# Patient Record
Sex: Female | Born: 1990
Health system: Southern US, Community
[De-identification: ages and names within clinical notes are randomized; demographics above are authoritative.]

## PROBLEM LIST (undated history)

## (undated) ENCOUNTER — Inpatient Hospital Stay (HOSPITAL_COMMUNITY): Payer: Medicaid Other

## (undated) ENCOUNTER — Inpatient Hospital Stay (HOSPITAL_COMMUNITY): Payer: Self-pay

## (undated) DIAGNOSIS — O139 Gestational [pregnancy-induced] hypertension without significant proteinuria, unspecified trimester: Secondary | ICD-10-CM

## (undated) DIAGNOSIS — R8271 Bacteriuria: Secondary | ICD-10-CM

## (undated) DIAGNOSIS — K219 Gastro-esophageal reflux disease without esophagitis: Secondary | ICD-10-CM

## (undated) DIAGNOSIS — O99019 Anemia complicating pregnancy, unspecified trimester: Secondary | ICD-10-CM

## (undated) DIAGNOSIS — R898 Other abnormal findings in specimens from other organs, systems and tissues: Secondary | ICD-10-CM

## (undated) DIAGNOSIS — F419 Anxiety disorder, unspecified: Secondary | ICD-10-CM

## (undated) DIAGNOSIS — I10 Essential (primary) hypertension: Secondary | ICD-10-CM

## (undated) DIAGNOSIS — F4310 Post-traumatic stress disorder, unspecified: Secondary | ICD-10-CM

## (undated) DIAGNOSIS — F329 Major depressive disorder, single episode, unspecified: Secondary | ICD-10-CM

## (undated) HISTORY — DX: Anxiety disorder, unspecified: F41.9

## (undated) HISTORY — DX: Essential (primary) hypertension: I10

## (undated) HISTORY — DX: Post-traumatic stress disorder, unspecified: F43.10

---
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
## 2011-05-22 DIAGNOSIS — O99019 Anemia complicating pregnancy, unspecified trimester: Secondary | ICD-10-CM

## 2011-05-22 DIAGNOSIS — F32A Depression, unspecified: Secondary | ICD-10-CM

## 2011-05-22 HISTORY — DX: Depression, unspecified: F32.A

## 2011-05-22 HISTORY — DX: Anemia complicating pregnancy, unspecified trimester: O99.019

## 2014-10-30 ENCOUNTER — Encounter (HOSPITAL_COMMUNITY): Payer: Self-pay | Admitting: Emergency Medicine

## 2014-10-30 ENCOUNTER — Emergency Department (HOSPITAL_COMMUNITY)
Admission: EM | Admit: 2014-10-30 | Discharge: 2014-10-30 | Disposition: A | Discharge status: Home / Self Care | Payer: Self-pay | Attending: Emergency Medicine | Admitting: Emergency Medicine

## 2014-10-30 DIAGNOSIS — R109 Unspecified abdominal pain: Secondary | ICD-10-CM | POA: Insufficient documentation

## 2014-10-30 DIAGNOSIS — Z720 Tobacco use: Secondary | ICD-10-CM | POA: Insufficient documentation

## 2014-10-30 DIAGNOSIS — N939 Abnormal uterine and vaginal bleeding, unspecified: Secondary | ICD-10-CM | POA: Insufficient documentation

## 2014-10-30 DIAGNOSIS — Z3202 Encounter for pregnancy test, result negative: Secondary | ICD-10-CM | POA: Insufficient documentation

## 2014-10-30 LAB — COMPREHENSIVE METABOLIC PANEL
ALT: 16 U/L (ref 14–54)
AST: 16 U/L (ref 15–41)
Albumin: 4 g/dL (ref 3.5–5.0)
Alkaline Phosphatase: 47 U/L (ref 38–126)
Anion gap: 6 (ref 5–15)
BUN: 10 mg/dL (ref 6–20)
CO2: 23 mmol/L (ref 22–32)
Calcium: 8.9 mg/dL (ref 8.9–10.3)
Chloride: 109 mmol/L (ref 101–111)
Creatinine, Ser: 0.84 mg/dL (ref 0.44–1.00)
GFR calc non Af Amer: >60 mL/min (ref >60)
Glucose, Bld: 86 mg/dL (ref 65–99)
Potassium: 3.7 mmol/L (ref 3.5–5.1)
Sodium: 138 mmol/L (ref 135–145)
TOTAL PROTEIN: 7 g/dL (ref 6.5–8.1)
Total Bilirubin: 0.5 mg/dL (ref 0.3–1.2)

## 2014-10-30 LAB — CBC WITH DIFFERENTIAL/PLATELET
BASOS ABS: 0 10*3/uL (ref 0.0–0.1)
Basophils Relative: 0 % (ref 0–1)
Eosinophils Absolute: 0.2 10*3/uL (ref 0.0–0.7)
Eosinophils Relative: 3 % (ref 0–5)
HCT: 43.2 % (ref 36.0–46.0)
Hemoglobin: 14.4 g/dL (ref 12.0–15.0)
Lymphocytes Relative: 29 % (ref 12–46)
Lymphs Abs: 2 10*3/uL (ref 0.7–4.0)
MCH: 28 pg (ref 26.0–34.0)
MCHC: 33.3 g/dL (ref 30.0–36.0)
MCV: 84 fL (ref 78.0–100.0)
Monocytes Absolute: 0.7 10*3/uL (ref 0.1–1.0)
Monocytes Relative: 10 % (ref 3–12)
NEUTROS PCT: 58 % (ref 43–77)
Neutro Abs: 4.1 10*3/uL (ref 1.7–7.7)
PLATELETS: 436 10*3/uL — AB (ref 150–400)
RBC: 5.14 MIL/uL — ABNORMAL HIGH (ref 3.87–5.11)
RDW: 13.3 % (ref 11.5–15.5)
WBC: 7.1 10*3/uL (ref 4.0–10.5)

## 2014-10-30 LAB — POC URINE PREG, ED: PREG TEST UR: NEGATIVE

## 2014-10-30 LAB — WET PREP, GENITAL
Clue Cells Wet Prep HPF POC: NONE SEEN
TRICH WET PREP: NONE SEEN
Yeast Wet Prep HPF POC: NONE SEEN

## 2014-10-30 LAB — ABO/RH: ABO/RH(D): A POS

## 2014-10-30 LAB — HCG, QUANTITATIVE, PREGNANCY: hCG, Beta Chain, Quant, S: 4 m[IU]/mL (ref <5)

## 2014-10-30 NOTE — ED Provider Notes (Signed)
CSN: [id number]     Arrival date & time 10/30/14  1616 History   First MD Initiated Contact with Patient 10/30/14 1631     Chief Complaint  Patient presents with  . Vaginal Bleeding  . Possible Pregnancy     (Consider location/radiation/quality/duration/timing/severity/associated sxs/prior Treatment) HPI Comments: Patient presents to the emergency department with chief complaint of 2 days of vaginal bleeding. She states that her last period was just over a month ago. She states that she took a pregnancy test 2 weeks ago, and it was positive. She states that she schedule an appointment with her OB/GYN for 6/24. She states that she noticed some bleeding after intercourse yesterday. The bleeding has been moderate since then. She denies any other vaginal discharge. She reports mild left-sided abdominal cramping that started last night. There are no aggravating or alleviating factors.  The history is provided by the patient. No language interpreter was used.    History reviewed. No pertinent past medical history. Past Surgical History  Procedure Laterality Date  . Cesarean section     No family history on file. History  Substance Use Topics  . Smoking status: Current Every Day Smoker -- 0.50 packs/day    Types: Cigarettes  . Smokeless tobacco: Not on file  . Alcohol Use: 4.2 oz/week    7 Cans of beer per week   OB History    No data available     Review of Systems  Constitutional: Negative for fever and chills.  Respiratory: Negative for shortness of breath.   Cardiovascular: Negative for chest pain.  Gastrointestinal: Positive for abdominal pain. Negative for nausea, vomiting, diarrhea and constipation.  Genitourinary: Positive for vaginal bleeding. Negative for dysuria.  All other systems reviewed and are negative.     Allergies  Review of patient's allergies indicates no known allergies.  Home Medications   Prior to Admission medications   Not on File   BP 173/91  mmHg  Pulse 70  Temp(Src) 97.4 F (36.3 C) (Oral)  Resp 18  Ht 5\' 8"  (1.727 m)  Wt 240 lb (108.863 kg)  BMI 36.50 kg/m2  SpO2 100%  LMP 10/01/2014 (Exact Date) Physical Exam  Constitutional: She is oriented to person, place, and time. She appears well-developed and well-nourished.  HENT:  Head: Normocephalic and atraumatic.  Eyes: Conjunctivae and EOM are normal. Pupils are equal, round, and reactive to light.  Neck: Normal range of motion. Neck supple.  Cardiovascular: Normal rate and regular rhythm.  Exam reveals no gallop and no friction rub.   No murmur heard. Pulmonary/Chest: Effort normal and breath sounds normal. No respiratory distress. She has no wheezes. She has no rales. She exhibits no tenderness.  Abdominal: Soft. Bowel sounds are normal. She exhibits no distension and no mass. There is no tenderness. There is no rebound and no guarding.  No focal abdominal tenderness, no RLQ tenderness or pain at McBurney's point, no RUQ tenderness or Murphy's sign, no left-sided abdominal tenderness, no fluid wave, or signs of peritonitis   Genitourinary:  Pelvic exam chaperoned by female ER tech, no right or left adnexal tenderness, no uterine tenderness, no vaginal discharge, moderate bleeding, no CMT or friability, no foreign body, no injury to the external genitalia, no other significant findings   Musculoskeletal: Normal range of motion. She exhibits no edema or tenderness.  Neurological: She is alert and oriented to person, place, and time.  Skin: Skin is warm and dry.  Psychiatric: She has a normal mood and affect. Her  behavior is normal. Judgment and thought content normal.  Nursing note and vitals reviewed.   ED Course  Procedures (including critical care time) Results for orders placed or performed during the hospital encounter of 10/30/14  Wet prep, genital  Result Value Ref Range   Yeast Wet Prep HPF POC NONE SEEN NONE SEEN   Trich, Wet Prep NONE SEEN NONE SEEN   Clue  Cells Wet Prep HPF POC NONE SEEN NONE SEEN   WBC, Wet Prep HPF POC FEW (A) NONE SEEN  CBC with Differential  Result Value Ref Range   WBC 7.1 4.0 - 10.5 K/uL   RBC 5.14 (H) 3.87 - 5.11 MIL/uL   Hemoglobin 14.4 12.0 - 15.0 g/dL   HCT 40.9 81.1 - 91.4 %   MCV 84.0 78.0 - 100.0 fL   MCH 28.0 26.0 - 34.0 pg   MCHC 33.3 30.0 - 36.0 g/dL   RDW 78.2 95.6 - 21.3 %   Platelets 436 (H) 150 - 400 K/uL   Neutrophils Relative % 58 43 - 77 %   Neutro Abs 4.1 1.7 - 7.7 K/uL   Lymphocytes Relative 29 12 - 46 %   Lymphs Abs 2.0 0.7 - 4.0 K/uL   Monocytes Relative 10 3 - 12 %   Monocytes Absolute 0.7 0.1 - 1.0 K/uL   Eosinophils Relative 3 0 - 5 %   Eosinophils Absolute 0.2 0.0 - 0.7 K/uL   Basophils Relative 0 0 - 1 %   Basophils Absolute 0.0 0.0 - 0.1 K/uL  Comprehensive metabolic panel  Result Value Ref Range   Sodium 138 135 - 145 mmol/L   Potassium 3.7 3.5 - 5.1 mmol/L   Chloride 109 101 - 111 mmol/L   CO2 23 22 - 32 mmol/L   Glucose, Bld 86 65 - 99 mg/dL   BUN 10 6 - 20 mg/dL   Creatinine, Ser 0.86 0.44 - 1.00 mg/dL   Calcium 8.9 8.9 - 57.8 mg/dL   Total Protein 7.0 6.5 - 8.1 g/dL   Albumin 4.0 3.5 - 5.0 g/dL   AST 16 15 - 41 U/L   ALT 16 14 - 54 U/L   Alkaline Phosphatase 47 38 - 126 U/L   Total Bilirubin 0.5 0.3 - 1.2 mg/dL   GFR calc non Af Amer >60 >60 mL/min   GFR calc Af Amer >60 >60 mL/min   Anion gap 6 5 - 15  hCG, quantitative, pregnancy  Result Value Ref Range   hCG, Beta Chain, Quant, S 4 <5 mIU/mL  POC Urine Pregnancy, ED  (If Pre-menopausal female)  not at [facility]  Result Value Ref Range   Preg Test, Ur NEGATIVE NEGATIVE  ABO/Rh  Result Value Ref Range   ABO/RH(D) A POS    No results found.    EKG Interpretation None      MDM   Final diagnoses:  Vaginal bleeding    Patient with concern for vaginal bleeding in 1st trimester pregnancy.  Will check labs, Rh, HCG, and check pelvic exam.   Urine pregnancy test is negative, hCG is 4, as patient is not  pregnant, will treat for vaginal bleeding. Patient had her last menstrual period on May 9. Suspect that this is her normal menstrual cycle.  Could have had false positive pregnancy test, or perhaps has miscarried.  Rh positive.  Will DC to home with regular PCP follow-up.      Roxy Horseman, PA-C 10/30/14 1847  Pricilla Loveless, MD 10/30/14 [phone]

## 2014-10-30 NOTE — ED Notes (Signed)
Pt denied vitals at discharge

## 2014-10-30 NOTE — Discharge Instructions (Signed)
Abnormal Uterine Bleeding Abnormal uterine bleeding can affect women at various stages in life, including teenagers, women in their reproductive years, pregnant women, and women who have reached menopause. Several kinds of uterine bleeding are considered abnormal, including:  Bleeding or spotting between periods.   Bleeding after sexual intercourse.   Bleeding that is heavier or more than normal.   Periods that last longer than usual.  Bleeding after menopause.  Many cases of abnormal uterine bleeding are minor and simple to treat, while others are more serious. Any type of abnormal bleeding should be evaluated by your health care provider. Treatment will depend on the cause of the bleeding. HOME CARE INSTRUCTIONS Monitor your condition for any changes. The following actions may help to alleviate any discomfort you are experiencing:  Avoid the use of tampons and douches as directed by your health care provider.  Change your pads frequently. You should get regular pelvic exams and Pap tests. Keep all follow-up appointments for diagnostic tests as directed by your health care provider.  SEEK MEDICAL CARE IF:   Your bleeding lasts more than 1 week.   You feel dizzy at times.  SEEK IMMEDIATE MEDICAL CARE IF:   You pass out.   You are changing pads every 15 to 30 minutes.   You have abdominal pain.  You have a fever.   You become sweaty or weak.   You are passing large blood clots from the vagina.   You start to feel nauseous and vomit. MAKE SURE YOU:   Understand these instructions.  Will watch your condition.  Will get help right away if you are not doing well or get worse. Document Released: 05/07/2005 Document Revised: 05/12/2013 Document Reviewed: 12/04/2012 Exit[employer] Patient Information 2015 [employer]. This information is not intended to replace advice given to you by your health care provider. Make sure you discuss any questions you have with your  health care provider.  

## 2014-10-30 NOTE — ED Notes (Signed)
Pt reports 3 positive pregnancy tests 2 weeks ago; confirmation appointment with OBGYN scheduled for 6/24. Pt reports onset heavy vaginal bleeding and left lower abdominal cramping last night.

## 2014-11-01 LAB — GC/CHLAMYDIA PROBE AMP (~~LOC~~) NOT AT ARMC
Chlamydia: NEGATIVE
Neisseria Gonorrhea: NEGATIVE

## 2016-01-13 ENCOUNTER — Encounter (HOSPITAL_COMMUNITY): Payer: Self-pay | Admitting: *Deleted

## 2016-01-13 ENCOUNTER — Inpatient Hospital Stay (HOSPITAL_COMMUNITY)
Admission: AD | Admit: 2016-01-13 | Discharge: 2016-01-13 | Disposition: A | Discharge status: Home / Self Care | Payer: Medicaid Other | Source: Ambulatory Visit | Attending: Obstetrics & Gynecology | Admitting: Obstetrics & Gynecology

## 2016-01-13 ENCOUNTER — Inpatient Hospital Stay (HOSPITAL_COMMUNITY): Payer: Medicaid Other

## 2016-01-13 DIAGNOSIS — F17210 Nicotine dependence, cigarettes, uncomplicated: Secondary | ICD-10-CM | POA: Diagnosis not present

## 2016-01-13 DIAGNOSIS — O26892 Other specified pregnancy related conditions, second trimester: Secondary | ICD-10-CM | POA: Insufficient documentation

## 2016-01-13 DIAGNOSIS — Z79899 Other long term (current) drug therapy: Secondary | ICD-10-CM | POA: Diagnosis not present

## 2016-01-13 DIAGNOSIS — R1030 Lower abdominal pain, unspecified: Secondary | ICD-10-CM | POA: Diagnosis present

## 2016-01-13 DIAGNOSIS — A599 Trichomoniasis, unspecified: Secondary | ICD-10-CM | POA: Diagnosis not present

## 2016-01-13 DIAGNOSIS — O161 Unspecified maternal hypertension, first trimester: Secondary | ICD-10-CM

## 2016-01-13 DIAGNOSIS — O9989 Other specified diseases and conditions complicating pregnancy, childbirth and the puerperium: Secondary | ICD-10-CM

## 2016-01-13 DIAGNOSIS — R112 Nausea with vomiting, unspecified: Secondary | ICD-10-CM | POA: Diagnosis not present

## 2016-01-13 DIAGNOSIS — Z3202 Encounter for pregnancy test, result negative: Secondary | ICD-10-CM

## 2016-01-13 DIAGNOSIS — O26891 Other specified pregnancy related conditions, first trimester: Secondary | ICD-10-CM | POA: Diagnosis not present

## 2016-01-13 DIAGNOSIS — O99332 Smoking (tobacco) complicating pregnancy, second trimester: Secondary | ICD-10-CM | POA: Diagnosis not present

## 2016-01-13 DIAGNOSIS — Z3A01 Less than 8 weeks gestation of pregnancy: Secondary | ICD-10-CM | POA: Diagnosis not present

## 2016-01-13 DIAGNOSIS — Z202 Contact with and (suspected) exposure to infections with a predominantly sexual mode of transmission: Secondary | ICD-10-CM

## 2016-01-13 DIAGNOSIS — Z9889 Other specified postprocedural states: Secondary | ICD-10-CM | POA: Diagnosis not present

## 2016-01-13 DIAGNOSIS — R109 Unspecified abdominal pain: Secondary | ICD-10-CM

## 2016-01-13 DIAGNOSIS — O219 Vomiting of pregnancy, unspecified: Secondary | ICD-10-CM

## 2016-01-13 DIAGNOSIS — O26899 Other specified pregnancy related conditions, unspecified trimester: Secondary | ICD-10-CM

## 2016-01-13 LAB — URINE MICROSCOPIC-ADD ON

## 2016-01-13 LAB — CBC WITH DIFFERENTIAL/PLATELET
BASOS ABS: 0 10*3/uL (ref 0.0–0.1)
Basophils Relative: 0 %
EOS PCT: 0 %
Eosinophils Absolute: 0 10*3/uL (ref 0.0–0.7)
HEMATOCRIT: 41.3 % (ref 36.0–46.0)
Hemoglobin: 14.6 g/dL (ref 12.0–15.0)
LYMPHS PCT: 21 %
Lymphs Abs: 2.1 10*3/uL (ref 0.7–4.0)
MCH: 28.5 pg (ref 26.0–34.0)
MCHC: 35.4 g/dL (ref 30.0–36.0)
MCV: 80.5 fL (ref 78.0–100.0)
MONO ABS: 0.5 10*3/uL (ref 0.1–1.0)
Monocytes Relative: 5 %
NEUTROS ABS: 7.5 10*3/uL (ref 1.7–7.7)
Neutrophils Relative %: 74 %
PLATELETS: 467 10*3/uL — AB (ref 150–400)
RBC: 5.13 MIL/uL — ABNORMAL HIGH (ref 3.87–5.11)
RDW: 13.5 % (ref 11.5–15.5)
WBC: 10.1 10*3/uL (ref 4.0–10.5)

## 2016-01-13 LAB — URINALYSIS, ROUTINE W REFLEX MICROSCOPIC
Glucose, UA: NEGATIVE mg/dL
Ketones, ur: 15 mg/dL — AB
Nitrite: NEGATIVE
Protein, ur: 100 mg/dL — AB
Specific Gravity, Urine: >1.03 — ABNORMAL HIGH (ref 1.005–1.030)
pH: 6 (ref 5.0–8.0)

## 2016-01-13 LAB — WET PREP, GENITAL
Clue Cells Wet Prep HPF POC: NONE SEEN
Sperm: NONE SEEN
Yeast Wet Prep HPF POC: NONE SEEN

## 2016-01-13 LAB — POCT PREGNANCY, URINE: Preg Test, Ur: POSITIVE — AB

## 2016-01-13 LAB — HCG, QUANTITATIVE, PREGNANCY: hCG, Beta Chain, Quant, S: 14991 m[IU]/mL — ABNORMAL HIGH (ref <5)

## 2016-01-13 MED ORDER — PROMETHAZINE HCL 25 MG PO TABS: 25.0000 mg | ORAL_TABLET | Freq: Four times a day (QID) | ORAL | 0 refills | Status: DC | PRN

## 2016-01-13 MED ORDER — METRONIDAZOLE 500 MG PO TABS
2000.0000 mg | ORAL_TABLET | Freq: Once | ORAL | Status: AC
  Administered 2016-01-13: 2000 mg via ORAL
  Filled 2016-01-13: qty 4

## 2016-01-13 MED ORDER — LABETALOL HCL 100 MG PO TABS: 100.0000 mg | ORAL_TABLET | Freq: Two times a day (BID) | ORAL | 0 refills | Status: DC

## 2016-01-13 NOTE — MAU Provider Note (Signed)
History     CSN: [id number]  Arrival date and time: 01/13/16 1255   First Provider Initiated Contact with Patient 01/13/16 1311      Chief Complaint  Patient presents with  . Abdominal Pain  . Morning Sickness   HPI Ms. Ferdinand LangoLekia M Marse is a 25 y.o. G1P0 at 2050w3d who presents to MAU today with complaint of lower abdominal pain and N/V. The patient states N/V x 2 weeks that has been intermittent, mostly in the mornings. She states mild to moderate lower abdominal cramping. She hasn't taken anything for pain. She denies vaginal bleeding, discharge, UTI symptoms, diarrhea or constipation.   OB History    Gravida Para Term Preterm AB Living   2 1 1     1    SAB TAB Ectopic Multiple Live Births                  History reviewed. No pertinent past medical history.  Past Surgical History:  Procedure Laterality Date  . CESAREAN SECTION      History reviewed. No pertinent family history.  Social History  Substance Use Topics  . Smoking status: Current Every Day Smoker    Packs/day: 0.50    Types: Cigarettes  . Smokeless tobacco: Current User  . Alcohol use 4.2 oz/week    7 Cans of beer per week    Allergies: No Known Allergies  Prescriptions Prior to Admission  Medication Sig Dispense Refill Last Dose  . Acetaminophen (PAIN RELIEF 8 HOUR PO) Take 1 tablet by mouth every 8 (eight) hours as needed (pain).   10/30/2014 at Unknown time    Review of Systems  Constitutional: Negative for fever and malaise/fatigue.  Gastrointestinal: Positive for abdominal pain, nausea and vomiting. Negative for constipation and diarrhea.  Genitourinary: Negative for dysuria, frequency and urgency.       Neg - vaginal bleeding, discharge   Physical Exam   Blood pressure 162/96, pulse 103, temperature 98 F (36.7 C), resp. rate 16, weight (!) 346 lb (156.9 kg), last menstrual period 12/06/2015.  Physical Exam  Nursing note and vitals reviewed. Constitutional: She is oriented to person,  place, and time. She appears well-developed and well-nourished. No distress.  HENT:  Head: Normocephalic and atraumatic.  Cardiovascular: Normal rate.   Respiratory: Effort normal.  GI: Soft. She exhibits no distension and no mass. There is no tenderness. There is no rebound and no guarding.  Genitourinary: Uterus is tender. Uterus is not enlarged. Cervix exhibits no motion tenderness, no discharge and no friability. Right adnexum displays tenderness. Right adnexum displays no mass. Left adnexum displays tenderness. Left adnexum displays no mass. No bleeding in the vagina. Vaginal discharge (scant thin, white discharge) found.  Neurological: She is alert and oriented to person, place, and time.  Skin: Skin is warm and dry. No erythema.  Psychiatric: She has a normal mood and affect.   Results for orders placed or performed during the hospital encounter of 01/13/16 (from the past 24 hour(s))  Urinalysis, Routine w reflex microscopic ([facility])     Status: Abnormal   Collection Time: 01/13/16  1:05 PM  Result Value Ref Range   Color, Urine YELLOW YELLOW   APPearance HAZY (A) CLEAR   Specific Gravity, Urine >1.030 (H) 1.005 - 1.030   pH 6.0 5.0 - 8.0   Glucose, UA NEGATIVE NEGATIVE mg/dL   Hgb urine dipstick SMALL (A) NEGATIVE   Bilirubin Urine SMALL (A) NEGATIVE   Ketones, ur 15 (A) NEGATIVE  mg/dL   Protein, ur 161 (A) NEGATIVE mg/dL   Nitrite NEGATIVE NEGATIVE   Leukocytes, UA SMALL (A) NEGATIVE  Urine microscopic-add on     Status: Abnormal   Collection Time: 01/13/16  1:05 PM  Result Value Ref Range   Squamous Epithelial / LPF 6-30 (A) NONE SEEN   WBC, UA 6-30 0 - 5 WBC/hpf   RBC / HPF 0-5 0 - 5 RBC/hpf   Bacteria, UA RARE (A) NONE SEEN   Trichomonas, UA PRESENT    Urine-Other MUCOUS PRESENT   Pregnancy, urine POC     Status: Abnormal   Collection Time: 01/13/16  1:08 PM  Result Value Ref Range   Preg Test, Ur POSITIVE (A) NEGATIVE  Wet prep, genital     Status: Abnormal    Collection Time: 01/13/16  1:15 PM  Result Value Ref Range   Yeast Wet Prep HPF POC NONE SEEN NONE SEEN   Trich, Wet Prep PRESENT (A) NONE SEEN   Clue Cells Wet Prep HPF POC NONE SEEN NONE SEEN   WBC, Wet Prep HPF POC MODERATE (A) NONE SEEN   Sperm NONE SEEN   CBC with Differential/Platelet     Status: Abnormal   Collection Time: 01/13/16  1:23 PM  Result Value Ref Range   WBC 10.1 4.0 - 10.5 K/uL   RBC 5.13 (H) 3.87 - 5.11 MIL/uL   Hemoglobin 14.6 12.0 - 15.0 g/dL   HCT 09.6 04.5 - 40.9 %   MCV 80.5 78.0 - 100.0 fL   MCH 28.5 26.0 - 34.0 pg   MCHC 35.4 30.0 - 36.0 g/dL   RDW 81.1 91.4 - 78.2 %   Platelets 467 (H) 150 - 400 K/uL   Neutrophils Relative % 74 %   Neutro Abs 7.5 1.7 - 7.7 K/uL   Lymphocytes Relative 21 %   Lymphs Abs 2.1 0.7 - 4.0 K/uL   Monocytes Relative 5 %   Monocytes Absolute 0.5 0.1 - 1.0 K/uL   Eosinophils Relative 0 %   Eosinophils Absolute 0.0 0.0 - 0.7 K/uL   Basophils Relative 0 %   Basophils Absolute 0.0 0.0 - 0.1 K/uL   US Ob Comp Less 14 Wks  Result Date: 01/13/2016 CLINICAL DATA:  Early pregnancy, viability, pain. EXAM: OBSTETRIC <14 WK [place] AND TRANSVAGINAL OB US TECHNIQUE: Both transabdominal and transvaginal ultrasound examinations were performed for complete evaluation of the gestation as well as the maternal uterus, adnexal regions, and pelvic cul-de-sac. Transvaginal technique was performed to assess early pregnancy. COMPARISON:  None. FINDINGS: Intrauterine gestational sac: Single intrauterine gestational sac with appropriate surrounding decidual reaction. Yolk sac:  Present Embryo:  Present Cardiac Activity: Present Heart Rate: 121  bpm MSD:   mm    w     d CRL:  3  mm   6 w   0 d                  [place] EDC: 09/07/2016 Subchorionic hemorrhage:  None visualized. Maternal uterus/adnexae: Maternal ovaries appear normal and there is no mass or free fluid seen within either adnexal region. IMPRESSION: 1. Single live intrauterine pregnancy with estimated  gestational age of [redacted] weeks and 0 days. 2. Maternal ovaries appear normal and there is no mass or free fluid seen within either adnexal region. Electronically Signed   By: Bary Richard M.D.   On: 01/13/2016 14:29   US Ob Transvaginal  Result Date: 01/13/2016 CLINICAL DATA:  Early pregnancy, viability, pain. EXAM: OBSTETRIC <  14 WK [place] AND TRANSVAGINAL OB US TECHNIQUE: Both transabdominal and transvaginal ultrasound examinations were performed for complete evaluation of the gestation as well as the maternal uterus, adnexal regions, and pelvic cul-de-sac. Transvaginal technique was performed to assess early pregnancy. COMPARISON:  None. FINDINGS: Intrauterine gestational sac: Single intrauterine gestational sac with appropriate surrounding decidual reaction. Yolk sac:  Present Embryo:  Present Cardiac Activity: Present Heart Rate: 121  bpm MSD:   mm    w     d CRL:  3  mm   6 w   0 d                  [place] EDC: 09/07/2016 Subchorionic hemorrhage:  None visualized. Maternal uterus/adnexae: Maternal ovaries appear normal and there is no mass or free fluid seen within either adnexal region. IMPRESSION: 1. Single live intrauterine pregnancy with estimated gestational age of [redacted] weeks and 0 days. 2. Maternal ovaries appear normal and there is no mass or free fluid seen within either adnexal region. Electronically Signed   By: Bary Richard M.D.   On: 01/13/2016 14:29    MAU Course  Procedures None  MDM +UPT UA, wet prep, GC/chlamydia, CBC, quant hCG, HIV, RPR and [place] today to rule out ectopic pregnancy A+ blood type in Epic from previous visit + trichomonas - 2 G Flagyl given in MAU  Discussed patient with Dr. Debroah Loop. Agrees with plan to start Labetalol today 100 mg BID and follow-up in [facility] at [facility] Assessment and Plan  A: SIUP at [redacted]w[redacted]d by [place] Trichomonas  Nausea and vomiting in pregnancy prior to [redacted] weeks gestation  CHTN, uncontrolled   P: Discharge home Rx for Phenergan and Labetalol given to patient   Patient treated with Flagyl in MAU. Partner treatment advised.  First trimester precautions discussed Patient advised to follow-up with OB Provider of choice Pregnancy confirmation letter given  Patient referred to [facility] for high risk prenatal care due to [facility] Patient may return to MAU as needed or if her condition were to change or worsen   Marny Lowenstein, PA-C  01/13/2016, 2:43 PM

## 2016-01-13 NOTE — MAU Note (Signed)
Pt presents to MAU with complaints missed cycle, vomiting and lower abdominal pain.

## 2016-01-13 NOTE — Discharge Instructions (Signed)
Hypertension During Pregnancy Hypertension is also called high blood pressure. Blood pressure moves blood in your body. Sometimes, the force that moves the blood becomes too strong. When you are pregnant, this condition should be watched carefully. It can cause problems for you and your baby. HOME CARE   Make and keep all of your doctor visits.  Take medicine as told by your doctor. Tell your doctor about all medicines you take.  Eat very little salt.  Exercise regularly.  Do not drink alcohol.  Do not smoke.  Do not have drinks with caffeine.  Lie on your left side when resting.  Your health care provider may ask you to take one low-dose aspirin (81mg ) each day. GET HELP RIGHT AWAY IF:  You have bad belly (abdominal) pain.  You have sudden puffiness (swelling) in the hands, ankles, or face.  You gain 4 pounds (1.8 kilograms) or more in 1 week.  You throw up (vomit) repeatedly.  You have bleeding from the vagina.  You do not feel the baby moving as much.  You have a headache.  You have blurred or double vision.  You have muscle twitching or spasms.  You have shortness of breath.  You have blue fingernails and lips.  You have blood in your pee (urine). MAKE SURE YOU:  Understand these instructions.  Will watch your condition.  Will get help right away if you are not doing well or get worse.   This information is not intended to replace advice given to you by your health care provider. Make sure you discuss any questions you have with your health care provider.   Document Released: 06/09/2010 Document Revised: 05/28/2014 Document Reviewed: 12/04/2012 Elsevier Interactive Patient Education [employer] Inc[employer] First Trimester of Pregnancy The first trimester of pregnancy is from week 1 until the end of week 12 (months 1 through 3). During this time, your baby will begin to develop inside you. At 6-8 weeks, the eyes and face are formed, and the heartbeat can be  seen on ultrasound. At the end of 12 weeks, all the baby's organs are formed. Prenatal care is all the medical care you receive before the birth of your baby. Make sure you get good prenatal care and follow all of your doctor's instructions. HOME CARE  Medicines  Take medicine only as told by your doctor. Some medicines are safe and some are not during pregnancy.  Take your prenatal vitamins as told by your doctor.  Take medicine that helps you poop (stool softener) as needed if your doctor says it is okay. Diet  Eat regular, healthy meals.  Your doctor will tell you the amount of weight gain that is right for you.  Avoid raw meat and uncooked cheese.  If you feel sick to your stomach (nauseous) or throw up (vomit):  Eat 4 or 5 small meals a day instead of 3 large meals.  Try eating a few soda crackers.  Drink liquids between meals instead of during meals.  If you have a hard time pooping (constipation):  Eat high-fiber foods like fresh vegetables, fruit, and whole grains.  Drink enough fluids to keep your pee (urine) clear or pale yellow. Activity and Exercise  Exercise only as told by your doctor. Stop exercising if you have cramps or pain in your lower belly (abdomen) or low back.  Try to avoid standing for long periods of time. Move your legs often if you must stand in one place for a long time.  Avoid  heavy lifting.  Wear low-heeled shoes. Sit and stand up straight.  You can have sex unless your doctor tells you not to. Relief of Pain or Discomfort  Wear a good support bra if your breasts are sore.  Take warm water baths (sitz baths) to soothe pain or discomfort caused by hemorrhoids. Use hemorrhoid cream if your doctor says it is okay.  Rest with your legs raised if you have leg cramps or low back pain.  Wear support hose if you have puffy, bulging veins (varicose veins) in your legs. Raise (elevate) your feet for 15 minutes, 3-4 times a day. Limit salt in your  diet. Prenatal Care  Schedule your prenatal visits by the twelfth week of pregnancy.  Write down your questions. Take them to your prenatal visits.  Keep all your prenatal visits as told by your doctor. Safety  Wear your seat belt at all times when driving.  Make a list of emergency phone numbers. The list should include numbers for family, friends, the hospital, and police and fire departments. General Tips  Ask your doctor for a referral to a local prenatal class. Begin classes no later than at the start of month 6 of your pregnancy.  Ask for help if you need counseling or help with nutrition. Your doctor can give you advice or tell you where to go for help.  Do not use hot tubs, steam rooms, or saunas.  Do not douche or use tampons or scented sanitary pads.  Do not cross your legs for long periods of time.  Avoid litter boxes and soil used by cats.  Avoid all smoking, herbs, and alcohol. Avoid drugs not approved by your doctor.  Do not use any tobacco products, including cigarettes, chewing tobacco, and electronic cigarettes. If you need help quitting, ask your doctor. You may get counseling or other support to help you quit.  Visit your dentist. At home, brush your teeth with a soft toothbrush. Be gentle when you floss. GET HELP IF:  You are dizzy.  You have mild cramps or pressure in your lower belly.  You have a nagging pain in your belly area.  You continue to feel sick to your stomach, throw up, or have watery poop (diarrhea).  You have a bad smelling fluid coming from your vagina.  You have pain with peeing (urination).  You have increased puffiness (swelling) in your face, hands, legs, or ankles. GET HELP RIGHT AWAY IF:   You have a fever.  You are leaking fluid from your vagina.  You have spotting or bleeding from your vagina.  You have very bad belly cramping or pain.  You gain or lose weight rapidly.  You throw up blood. It may look like coffee  grounds.  You are around people who have [place] measles, fifth disease, or chickenpox.  You have a very bad headache.  You have shortness of breath.  You have any kind of trauma, such as from a fall or a car accident.   This information is not intended to replace advice given to you by your health care provider. Make sure you discuss any questions you have with your health care provider.   Document Released: 10/24/2007 Document Revised: 05/28/2014 Document Reviewed: 03/17/2013 Elsevier Interactive Patient Education 2016 [employer]. Trichomoniasis Trichomoniasis is an infection caused by an organism called Trichomonas. The infection can affect both women and men. In women, the outer female genitalia and the vagina are affected. In men, the penis is mainly affected, but  the prostate and other reproductive organs can also be involved. Trichomoniasis is a sexually transmitted infection (STI) and is most often passed to another person through sexual contact.  RISK FACTORS  Having unprotected sexual intercourse.  Having sexual intercourse with an infected partner. SIGNS AND SYMPTOMS  Symptoms of trichomoniasis in women include:  Abnormal gray-green frothy vaginal discharge.  Itching and irritation of the vagina.  Itching and irritation of the area outside the vagina. Symptoms of trichomoniasis in men include:   Penile discharge with or without pain.  Pain during urination. This results from inflammation of the urethra. DIAGNOSIS  Trichomoniasis may be found during a Pap test or physical exam. Your health care provider may use one of the following methods to help diagnose this infection:  Testing the pH of the vagina with a test tape.  Using a vaginal swab test that checks for the Trichomonas organism. A test is available that provides results within a few minutes.  Examining a urine sample.  Testing vaginal secretions. Your health care provider may test you for other STIs,  including HIV. TREATMENT   You may be given medicine to fight the infection. Women should inform their health care provider if they could be or are pregnant. Some medicines used to treat the infection should not be taken during pregnancy.  Your health care provider may recommend over-the-counter medicines or creams to decrease itching or irritation.  Your sexual partner will need to be treated if infected.  Your health care provider may test you for infection again 3 months after treatment. HOME CARE INSTRUCTIONS   Take medicines only as directed by your health care provider.  Take over-the-counter medicine for itching or irritation as directed by your health care provider.  Do not have sexual intercourse while you have the infection.  Women should not douche or wear tampons while they have the infection.  Discuss your infection with your partner. Your partner may have gotten the infection from you, or you may have gotten it from your partner.  Have your sex partner get examined and treated if necessary.  Practice safe, informed, and protected sex.  See your health care provider for other STI testing. SEEK MEDICAL CARE IF:   You still have symptoms after you finish your medicine.  You develop abdominal pain.  You have pain when you urinate.  You have bleeding after sexual intercourse.  You develop a rash.  Your medicine makes you sick or makes you throw up (vomit). MAKE SURE YOU:  Understand these instructions.  Will watch your condition.  Will get help right away if you are not doing well or get worse.   This information is not intended to replace advice given to you by your health care provider. Make sure you discuss any questions you have with your health care provider.   Document Released: 10/31/2000 Document Revised: 05/28/2014 Document Reviewed: 02/16/2013 Elsevier Interactive Patient Education [employer]

## 2016-01-14 LAB — RPR: RPR Ser Ql: NONREACTIVE

## 2016-01-14 LAB — HIV ANTIBODY (ROUTINE TESTING W REFLEX): HIV Screen 4th Generation wRfx: NONREACTIVE

## 2016-01-16 LAB — GC/CHLAMYDIA PROBE AMP (~~LOC~~) NOT AT ARMC
Chlamydia: NEGATIVE
Neisseria Gonorrhea: NEGATIVE

## 2016-01-21 ENCOUNTER — Encounter: Payer: Self-pay | Admitting: *Deleted

## 2016-02-02 ENCOUNTER — Other Ambulatory Visit (HOSPITAL_COMMUNITY)
Admission: RE | Admit: 2016-02-02 | Discharge: 2016-02-02 | Disposition: A | Discharge status: Home / Self Care | Payer: Medicaid Other | Source: Ambulatory Visit | Attending: Family Medicine | Admitting: Family Medicine

## 2016-02-02 ENCOUNTER — Ambulatory Visit (INDEPENDENT_AMBULATORY_CARE_PROVIDER_SITE_OTHER): Payer: Medicaid Other | Admitting: Family Medicine

## 2016-02-02 ENCOUNTER — Encounter: Payer: Self-pay | Admitting: Family Medicine

## 2016-02-02 VITALS — BP 121/62 | HR 70 | Wt 345.8 lb

## 2016-02-02 DIAGNOSIS — O34219 Maternal care for unspecified type scar from previous cesarean delivery: Secondary | ICD-10-CM | POA: Insufficient documentation

## 2016-02-02 DIAGNOSIS — O0991 Supervision of high risk pregnancy, unspecified, first trimester: Secondary | ICD-10-CM

## 2016-02-02 DIAGNOSIS — Z124 Encounter for screening for malignant neoplasm of cervix: Secondary | ICD-10-CM | POA: Diagnosis present

## 2016-02-02 DIAGNOSIS — O10911 Unspecified pre-existing hypertension complicating pregnancy, first trimester: Secondary | ICD-10-CM

## 2016-02-02 DIAGNOSIS — Z113 Encounter for screening for infections with a predominantly sexual mode of transmission: Secondary | ICD-10-CM | POA: Diagnosis present

## 2016-02-02 DIAGNOSIS — Z01419 Encounter for gynecological examination (general) (routine) without abnormal findings: Secondary | ICD-10-CM | POA: Diagnosis not present

## 2016-02-02 DIAGNOSIS — O10919 Unspecified pre-existing hypertension complicating pregnancy, unspecified trimester: Secondary | ICD-10-CM | POA: Insufficient documentation

## 2016-02-02 DIAGNOSIS — O0990 Supervision of high risk pregnancy, unspecified, unspecified trimester: Secondary | ICD-10-CM | POA: Insufficient documentation

## 2016-02-02 LAB — POCT URINALYSIS DIP (DEVICE)
Glucose, UA: NEGATIVE mg/dL
KETONES UR: NEGATIVE mg/dL
Leukocytes, UA: NEGATIVE
Nitrite: NEGATIVE
PH: 5.5 (ref 5.0–8.0)
Protein, ur: NEGATIVE mg/dL
Specific Gravity, Urine: 1.025 (ref 1.005–1.030)
Urobilinogen, UA: 1 mg/dL (ref 0.0–1.0)

## 2016-02-02 NOTE — Progress Notes (Signed)
Early 1 hr today.

## 2016-02-02 NOTE — Progress Notes (Signed)
  Subjective:    Terri Tran is a G2P1001 3577w6d being seen today for her first obstetrical visit.  Her obstetrical history is significant for obesity and CHTN. First cesarean was done at 38 weeks without a reason per the patient.  Patient does intend to breast feed. Pregnancy history fully reviewed.  Patient reports no complaints.  Vitals:   02/02/16 1259  BP: 121/62  Pulse: 70  Weight: (!) 345 lb 12.8 oz (156.9 kg)    HISTORY: OB History  Gravida Para Term Preterm AB Living  2 1 1     1   SAB TAB Ectopic Multiple Live Births               # Outcome Date GA Lbr Len/2nd Weight Sex Delivery Anes PTL Lv  2 Current           1 Term 08/08/11 5866w0d   F [id number]        Past Medical History:  Diagnosis Date  . Hypertension    Past Surgical History:  Procedure Laterality Date  . CESAREAN SECTION     Family History  Problem Relation Age of Onset  . Hypertension Mother      Exam    Uterus:     Pelvic Exam:    Perineum: No Hemorrhoids, Normal Perineum   Vulva: normal, Bartholin's, Urethra, Skene's normal   Vagina:  normal mucosa   Cervix: multiparous appearance  System:     Skin: normal coloration and turgor, no rashes    Neurologic: gait normal; reflexes normal and symmetric   Extremities: normal strength, tone, and muscle mass   HEENT PERRLA and extra ocular movement intact   Mouth/Teeth mucous membranes moist, pharynx normal without lesions   Neck supple and no masses   Cardiovascular: regular rate and rhythm, no murmurs or gallops   Respiratory:  appears well, vitals normal, no respiratory distress, acyanotic, normal RR, ear and throat exam is normal, neck free of mass or lymphadenopathy, chest clear, no wheezing, crepitations, rhonchi, normal symmetric air entry   Abdomen: soft, non-tender; bowel sounds normal; no masses,  no organomegaly   Urinary: urethral meatus normal      Assessment:    Pregnancy: G2P1001 Patient Active Problem List   Diagnosis Date  Noted  . Supervision of high risk pregnancy, antepartum 02/02/2016  . Chronic hypertension in pregnancy 02/02/2016  . Previous cesarean section complicating pregnancy 02/02/2016        Plan:      1. Supervision of high risk pregnancy, antepartum, first trimester Initial labs drawn. Prenatal vitamins. Problem list reviewed and updated. Genetic Screening discussed First Screen: requested.  Ultrasound discussed; fetal survey: requested. - Culture, OB Urine - Pain Mgmt, Profile 6 Conf w/o mM, U - Prenatal Profile - Glucose Tolerance, 1 HR (50g) w/o Fasting - US MFM Fetal Nuchal Translucency; Future  2. Chronic hypertension in pregnancy, first trimester Baseline labs.  Taking labetalol twice daily Growth US every 4 weeks after 20 weeks NST twice weekly at 32 weeks - Protein / creatinine ratio, urine - Comprehensive metabolic panel  3. Previous cesarean section complicating pregnancy Will request records.  Desires TOLAC.  Info given.    Follow up in 4 weeks. 100% of 30 min visit spent on counseling and coordination of care.     Candelaria CelesteSTINSON, JACOB JEHIEL 02/02/2016

## 2016-02-02 NOTE — Addendum Note (Signed)
Addended by: Kathee DeltonHILLMAN, Rashon Rezek L on: 02/02/2016 05:01 PM   Modules accepted: Orders

## 2016-02-03 LAB — PROTEIN / CREATININE RATIO, URINE
Creatinine, Urine: 286 mg/dL (ref 20–320)
Protein Creatinine Ratio: 45 mg/g creat (ref 21–161)
Total Protein, Urine: 13 mg/dL (ref 5–24)

## 2016-02-03 LAB — PRENATAL PROFILE (SOLSTAS)
Antibody Screen: NEGATIVE
Basophils Absolute: 0 cells/uL (ref 0–200)
Basophils Relative: 0 %
EOS PCT: 3 %
Eosinophils Absolute: 276 cells/uL (ref 15–500)
HEMATOCRIT: 39.8 % (ref 35.0–45.0)
HEMOGLOBIN: 13.6 g/dL (ref 11.7–15.5)
HIV 1&2 Ab, 4th Generation: NONREACTIVE
Hepatitis B Surface Ag: NEGATIVE
LYMPHS ABS: 2116 {cells}/uL (ref 850–3900)
Lymphocytes Relative: 23 %
MCH: 28.5 pg (ref 27.0–33.0)
MCHC: 34.2 g/dL (ref 32.0–36.0)
MCV: 83.3 fL (ref 80.0–100.0)
MONOS PCT: 9 %
MPV: 9.2 fL (ref 7.5–12.5)
Monocytes Absolute: 828 cells/uL (ref 200–950)
NEUTROS ABS: 5980 {cells}/uL (ref 1500–7800)
Neutrophils Relative %: 65 %
Platelets: 425 10*3/uL — ABNORMAL HIGH (ref 140–400)
RBC: 4.78 MIL/uL (ref 3.80–5.10)
RDW: 13.6 % (ref 11.0–15.0)
RUBELLA: 1.02 {index} — AB (ref <0.90)
Rh Type: POSITIVE
WBC: 9.2 10*3/uL (ref 3.8–10.8)

## 2016-02-03 LAB — COMPREHENSIVE METABOLIC PANEL
ALT: 15 U/L (ref 6–29)
AST: 15 U/L (ref 10–30)
Albumin: 3.8 g/dL (ref 3.6–5.1)
Alkaline Phosphatase: 42 U/L (ref 33–115)
BUN: 8 mg/dL (ref 7–25)
CO2: 24 mmol/L (ref 20–31)
Calcium: 9.5 mg/dL (ref 8.6–10.2)
Chloride: 102 mmol/L (ref 98–110)
Creat: 0.73 mg/dL (ref 0.50–1.10)
Glucose, Bld: 74 mg/dL (ref 65–99)
Potassium: 4.4 mmol/L (ref 3.5–5.3)
Sodium: 135 mmol/L (ref 135–146)
Total Bilirubin: 0.3 mg/dL (ref 0.2–1.2)
Total Protein: 6.3 g/dL (ref 6.1–8.1)

## 2016-02-03 LAB — GC/CHLAMYDIA PROBE AMP (~~LOC~~) NOT AT ARMC
Chlamydia: NEGATIVE
NEISSERIA GONORRHEA: NEGATIVE

## 2016-02-03 LAB — GLUCOSE TOLERANCE, 1 HOUR (50G) W/O FASTING: GLUCOSE, 1 HR, GESTATIONAL: 83 mg/dL (ref <140)

## 2016-02-04 LAB — CULTURE, OB URINE

## 2016-02-05 LAB — PAIN MGMT, PROFILE 6 CONF W/O MM, U
6 Acetylmorphine: NEGATIVE ng/mL (ref <10)
Alcohol Metabolites: NEGATIVE ng/mL (ref <500)
Amphetamines: NEGATIVE ng/mL (ref <500)
BARBITURATES: NEGATIVE ng/mL (ref <300)
BENZODIAZEPINES: NEGATIVE ng/mL (ref <100)
CREATININE: 248.5 mg/dL (ref >=20.0)
Cocaine Metabolite: NEGATIVE ng/mL (ref <150)
MARIJUANA METABOLITE: 500 ng/mL — AB (ref <5)
METHADONE METABOLITE: NEGATIVE ng/mL (ref <100)
Marijuana Metabolite: POSITIVE ng/mL — AB (ref <20)
OXIDANT: NEGATIVE ug/mL (ref <200)
OXYCODONE: NEGATIVE ng/mL (ref <100)
Opiates: NEGATIVE ng/mL (ref <100)
PLEASE NOTE: 0
Phencyclidine: NEGATIVE ng/mL (ref <25)
pH: 6.27 (ref 4.5–9.0)

## 2016-02-06 LAB — CYTOLOGY - PAP

## 2016-02-17 ENCOUNTER — Other Ambulatory Visit: Payer: Self-pay | Admitting: Family Medicine

## 2016-02-23 ENCOUNTER — Encounter (HOSPITAL_COMMUNITY): Payer: Self-pay | Admitting: Family Medicine

## 2016-02-29 ENCOUNTER — Encounter: Payer: Self-pay | Admitting: Advanced Practice Midwife

## 2016-02-29 ENCOUNTER — Ambulatory Visit (INDEPENDENT_AMBULATORY_CARE_PROVIDER_SITE_OTHER): Payer: Medicaid Other | Admitting: Advanced Practice Midwife

## 2016-02-29 VITALS — BP 120/72 | HR 69 | Wt 340.6 lb

## 2016-02-29 DIAGNOSIS — O0990 Supervision of high risk pregnancy, unspecified, unspecified trimester: Secondary | ICD-10-CM

## 2016-02-29 DIAGNOSIS — Z23 Encounter for immunization: Secondary | ICD-10-CM

## 2016-02-29 DIAGNOSIS — O10919 Unspecified pre-existing hypertension complicating pregnancy, unspecified trimester: Secondary | ICD-10-CM

## 2016-02-29 DIAGNOSIS — O10911 Unspecified pre-existing hypertension complicating pregnancy, first trimester: Secondary | ICD-10-CM | POA: Diagnosis present

## 2016-02-29 LAB — POCT URINALYSIS DIP (DEVICE)
Glucose, UA: NEGATIVE mg/dL
HGB URINE DIPSTICK: NEGATIVE
Ketones, ur: NEGATIVE mg/dL
Leukocytes, UA: NEGATIVE
NITRITE: NEGATIVE
PH: 6 (ref 5.0–8.0)
PROTEIN: NEGATIVE mg/dL
Specific Gravity, Urine: >=1.03 (ref 1.005–1.030)
UROBILINOGEN UA: 0.2 mg/dL (ref 0.0–1.0)

## 2016-02-29 NOTE — Progress Notes (Signed)
   PRENATAL VISIT NOTE  Subjective:  Terri Tran is a 25 y.o. G2P1001 at 8381w5d being seen today for ongoing prenatal care.  She is currently monitored for the following issues for this high-risk pregnancy and has Supervision of high risk pregnancy, antepartum; Chronic hypertension in pregnancy; and Previous cesarean section complicating pregnancy on her problem list.  Patient reports no complaints.  Contractions: Not present. Vag. Bleeding: None.  Movement: Present. Denies leaking of fluid.   The following portions of the patient's history were reviewed and updated as appropriate: allergies, current medications, past family history, past medical history, past social history, past surgical history and problem list. Problem list updated.  Objective:   Vitals:   02/29/16 1507  BP: 120/72  Pulse: 69  Weight: (!) 340 lb 9.6 oz (154.5 kg)    Fetal Status: Fetal Heart Rate (bpm): 156   Movement: Present     General:  Alert, oriented and cooperative. Patient is in no acute distress.  Skin: Skin is warm and dry. No rash noted.   Cardiovascular: Normal heart rate noted  Respiratory: Normal respiratory effort, no problems with respiration noted  Abdomen: Soft, gravid, appropriate for gestational age. Pain/Pressure: Absent     Pelvic:  Cervical exam deferred        Extremities: Normal range of motion.  Edema: Trace  Mental Status: Normal mood and affect. Normal behavior. Normal judgment and thought content.   Urinalysis:      Assessment and Plan:  Pregnancy: G2P1001 at 9381w5d  1. Chronic hypertension in pregnancy      BP good on 100mg  bid      Instructed to start ASA 81mg  qd  - Flu Vaccine QUAD 36+ mos IM  2. Supervision of high risk pregnancy, antepartum      Has NT/first trimester screen this Friday. Reminded it will include US and blood test      She requests PNV with iron. Reviewed hgb 13.5  Does not need iron  Preterm labor symptoms and general obstetric precautions including  but not limited to vaginal bleeding, contractions, leaking of fluid and fetal movement were reviewed in detail with the patient. Please refer to After Visit Summary for other counseling recommendations.  Return in about 4 weeks (around 03/28/2016) for [facility].  Aviva SignsMarie L Temara Lanum, CNM

## 2016-02-29 NOTE — Patient Instructions (Signed)

## 2016-03-02 ENCOUNTER — Encounter (HOSPITAL_COMMUNITY): Payer: Self-pay

## 2016-03-02 ENCOUNTER — Ambulatory Visit (HOSPITAL_COMMUNITY)
Admission: RE | Admit: 2016-03-02 | Discharge: 2016-03-02 | Disposition: A | Discharge status: Home / Self Care | Payer: Medicaid Other | Source: Ambulatory Visit | Attending: Family Medicine | Admitting: Family Medicine

## 2016-03-02 DIAGNOSIS — Z3682 Encounter for antenatal screening for nuchal translucency: Secondary | ICD-10-CM | POA: Insufficient documentation

## 2016-03-02 DIAGNOSIS — O34212 Maternal care for vertical scar from previous cesarean delivery: Secondary | ICD-10-CM | POA: Diagnosis not present

## 2016-03-02 DIAGNOSIS — O0991 Supervision of high risk pregnancy, unspecified, first trimester: Secondary | ICD-10-CM

## 2016-03-02 DIAGNOSIS — O99212 Obesity complicating pregnancy, second trimester: Secondary | ICD-10-CM | POA: Diagnosis not present

## 2016-03-02 DIAGNOSIS — Z3A13 13 weeks gestation of pregnancy: Secondary | ICD-10-CM | POA: Insufficient documentation

## 2016-03-07 ENCOUNTER — Other Ambulatory Visit: Payer: Self-pay | Admitting: Advanced Practice Midwife

## 2016-03-28 ENCOUNTER — Ambulatory Visit (INDEPENDENT_AMBULATORY_CARE_PROVIDER_SITE_OTHER): Payer: Medicaid Other | Admitting: Obstetrics and Gynecology

## 2016-03-28 VITALS — BP 114/64 | HR 81 | Wt 339.1 lb

## 2016-03-28 DIAGNOSIS — O0990 Supervision of high risk pregnancy, unspecified, unspecified trimester: Secondary | ICD-10-CM

## 2016-03-28 DIAGNOSIS — O10912 Unspecified pre-existing hypertension complicating pregnancy, second trimester: Secondary | ICD-10-CM

## 2016-03-28 DIAGNOSIS — O10919 Unspecified pre-existing hypertension complicating pregnancy, unspecified trimester: Secondary | ICD-10-CM

## 2016-03-28 MED ORDER — ASPIRIN 81 MG PO TABS: 81.0000 mg | ORAL_TABLET | Freq: Every day | ORAL | 6 refills | Status: DC

## 2016-03-28 NOTE — Progress Notes (Signed)
Discussed poor weight gain and expected weight gain. States not having much nausea, but not much appetite.

## 2016-03-28 NOTE — Progress Notes (Signed)
   PRENATAL VISIT NOTE  Subjective:  Terri Tran is a 25 y.o. G2P1001 at 2325w5d being seen today for ongoing prenatal care.  She is currently monitored for the following issues for this high-risk pregnancy and has Supervision of high risk pregnancy, antepartum; Chronic hypertension in pregnancy; and Previous cesarean section complicating pregnancy on her problem list.  Patient reports no complaints.  Contractions: Not present. Vag. Bleeding: None.  Movement: Present. Denies leaking of fluid.   The following portions of the patient's history were reviewed and updated as appropriate: allergies, current medications, past family history, past medical history, past social history, past surgical history and problem list. Problem list updated.  Objective:   Vitals:   03/28/16 1521  BP: 114/64  Pulse: 81  Weight: (!) 339 lb 1.6 oz (153.8 kg)    Fetal Status:     Movement: Present     General:  Alert, oriented and cooperative. Patient is in no acute distress.  Skin: Skin is warm and dry. No rash noted.   Cardiovascular: Normal heart rate noted  Respiratory: Normal respiratory effort, no problems with respiration noted  Abdomen: Soft, gravid, appropriate for gestational age. Pain/Pressure: Present     Pelvic:  Cervical exam deferred        Extremities: Normal range of motion.  Edema: Trace  Mental Status: Normal mood and affect. Normal behavior. Normal judgment and thought content.   Assessment and Plan:  Pregnancy: G2P1001 at 6225w5d  1. Chronic hypertension in pregnancy Taking 100mg  labetolol BID. BP good today.  Start ASA 81mg  daily  2. Supervision of high risk pregnancy, antepartum Routine care. Has already gotten flu shot.  Preterm labor symptoms and general obstetric precautions including but not limited to vaginal bleeding, contractions, leaking of fluid and fetal movement were reviewed in detail with the patient. Please refer to After Visit Summary for other counseling  recommendations.  Return in about 4 weeks (around 04/25/2016) for HROB.   Lorne SkeensNicholas Michael Hermen Mario, MD

## 2016-03-28 NOTE — Patient Instructions (Signed)
Second Trimester of Pregnancy  The second trimester is from week 13 through week 28, month 4 through 6. This is often the time in pregnancy that you feel your best. Often times, morning sickness has lessened or quit. You may have more energy, and you may get hungry more often. Your unborn baby (fetus) is growing rapidly. At the end of the sixth month, he or she is about 9 inches long and weighs about 1½ pounds. You will likely feel the baby move (quickening) between 18 and 20 weeks of pregnancy.  HOME CARE   · Avoid all smoking, herbs, and alcohol. Avoid drugs not approved by your doctor.  · Do not use any tobacco products, including cigarettes, chewing tobacco, and electronic cigarettes. If you need help quitting, ask your doctor. You may get counseling or other support to help you quit.  · Only take medicine as told by your doctor. Some medicines are safe and some are not during pregnancy.  · Exercise only as told by your doctor. Stop exercising if you start having cramps.  · Eat regular, healthy meals.  · Wear a good support bra if your breasts are tender.  · Do not use hot tubs, steam rooms, or saunas.  · Wear your seat belt when driving.  · Avoid raw meat, uncooked cheese, and liter boxes and soil used by cats.  · Take your prenatal vitamins.  · Take 1500-2000 milligrams of calcium daily starting at the 20th week of pregnancy until you deliver your baby.  · Try taking medicine that helps you poop (stool softener) as needed, and if your doctor approves. Eat more fiber by eating fresh fruit, vegetables, and whole grains. Drink enough fluids to keep your pee (urine) clear or pale yellow.  · Take warm water baths (sitz baths) to soothe pain or discomfort caused by hemorrhoids. Use hemorrhoid cream if your doctor approves.  · If you have puffy, bulging veins (varicose veins), wear support hose. Raise (elevate) your feet for 15 minutes, 3-4 times a day. Limit salt in your diet.  · Avoid heavy lifting, wear low heals,  and sit up straight.  · Rest with your legs raised if you have leg cramps or low back pain.  · Visit your dentist if you have not gone during your pregnancy. Use a soft toothbrush to brush your teeth. Be gentle when you floss.  · You can have sex (intercourse) unless your doctor tells you not to.  · Go to your doctor visits.  GET HELP IF:   · You feel dizzy.  · You have mild cramps or pressure in your lower belly (abdomen).  · You have a nagging pain in your belly area.  · You continue to feel sick to your stomach (nauseous), throw up (vomit), or have watery poop (diarrhea).  · You have bad smelling fluid coming from your vagina.  · You have pain with peeing (urination).  GET HELP RIGHT AWAY IF:   · You have a fever.  · You are leaking fluid from your vagina.  · You have spotting or bleeding from your vagina.  · You have severe belly cramping or pain.  · You lose or gain weight rapidly.  · You have trouble catching your breath and have chest pain.  · You notice sudden or extreme puffiness (swelling) of your face, hands, ankles, feet, or legs.  · You have not felt the baby move in over an hour.  · You have severe headaches that do   not go away with medicine.  · You have vision changes.     This information is not intended to replace advice given to you by your health care provider. Make sure you discuss any questions you have with your health care provider.     Document Released: 08/01/2009 Document Revised: 05/28/2014 Document Reviewed: 07/08/2012  Elsevier Interactive Patient Education ©2016 Elsevier Inc.

## 2016-03-31 ENCOUNTER — Encounter: Payer: Self-pay | Admitting: Obstetrics and Gynecology

## 2016-04-02 ENCOUNTER — Other Ambulatory Visit: Payer: Self-pay | Admitting: *Deleted

## 2016-04-02 ENCOUNTER — Encounter: Payer: Self-pay | Admitting: *Deleted

## 2016-04-02 DIAGNOSIS — O10919 Unspecified pre-existing hypertension complicating pregnancy, unspecified trimester: Secondary | ICD-10-CM

## 2016-04-02 DIAGNOSIS — O0990 Supervision of high risk pregnancy, unspecified, unspecified trimester: Secondary | ICD-10-CM

## 2016-04-02 MED ORDER — PRENATAL VITAMINS 28-0.8 MG PO TABS: 1.0000 | ORAL_TABLET | Freq: Every day | ORAL | 6 refills | Status: DC

## 2016-04-02 NOTE — Progress Notes (Signed)
Sent in PNV rx per patient request.

## 2016-04-13 ENCOUNTER — Other Ambulatory Visit: Payer: Self-pay | Admitting: Family Medicine

## 2016-04-24 ENCOUNTER — Ambulatory Visit (INDEPENDENT_AMBULATORY_CARE_PROVIDER_SITE_OTHER): Payer: Medicaid Other | Admitting: Obstetrics and Gynecology

## 2016-04-24 VITALS — BP 147/78 | HR 83 | Wt 337.5 lb

## 2016-04-24 DIAGNOSIS — Z3A20 20 weeks gestation of pregnancy: Secondary | ICD-10-CM

## 2016-04-24 DIAGNOSIS — O10919 Unspecified pre-existing hypertension complicating pregnancy, unspecified trimester: Secondary | ICD-10-CM

## 2016-04-24 DIAGNOSIS — O10912 Unspecified pre-existing hypertension complicating pregnancy, second trimester: Secondary | ICD-10-CM

## 2016-04-24 DIAGNOSIS — O0990 Supervision of high risk pregnancy, unspecified, unspecified trimester: Secondary | ICD-10-CM

## 2016-04-24 MED ORDER — LABETALOL HCL 100 MG PO TABS: 100.0000 mg | ORAL_TABLET | Freq: Two times a day (BID) | ORAL | 6 refills | Status: DC

## 2016-04-24 NOTE — Patient Instructions (Signed)
Hypertension During Pregnancy Hypertension is also called high blood pressure. High blood pressure means that the force of your blood moving in your body is too strong. When you are pregnant, this condition should be watched carefully. It can cause problems for you and your baby. Follow these instructions at home: Eating and drinking  Drink enough fluid to keep your pee (urine) clear or pale yellow.  Eat healthy foods that are low in salt (sodium). ? Do not add salt to your food. ? Check labels on foods and drinks to see much salt is in them. Look on the label where you see "Sodium." Lifestyle  Do not use any products that contain nicotine or tobacco, such as cigarettes and e-cigarettes. If you need help quitting, ask your doctor.  Do not use alcohol.  Avoid caffeine.  Avoid stress. Rest and get plenty of sleep. General instructions  Take over-the-counter and prescription medicines only as told by your doctor.  While lying down, lie on your left side. This keeps pressure off your baby.  While sitting or lying down, raise (elevate) your feet. Try putting some pillows under your lower legs.  Exercise regularly. Ask your doctor what kinds of exercise are best for you.  Keep all prenatal and follow-up visits as told by your doctor. This is important. Contact a doctor if:  You have symptoms that your doctor told you to watch for, such as: ? Fever. ? Throwing up (vomiting). ? Headache. Get help right away if:  You have very bad pain in your belly (abdomen).  You are throwing up, and this does not get better with treatment.  You suddenly get swelling in your hands, ankles, or face.  You gain 4 lb (1.8 kg) or more in 1 week.  You get bleeding from your vagina.  You have blood in your pee.  You do not feel your baby moving as much as normal.  You have a change in vision.  You have muscle twitching or sudden tightening (spasms).  You have trouble breathing.  Your lips  or fingernails turn blue. This information is not intended to replace advice given to you by your health care provider. Make sure you discuss any questions you have with your health care provider. Document Released: 06/09/2010 Document Revised: 01/17/2016 Document Reviewed: 01/17/2016 Elsevier Interactive Patient Education  2017 Else[employer] Inc.  

## 2016-04-24 NOTE — Progress Notes (Signed)
   PRENATAL VISIT NOTE  Subjective:  Terri Tran is a 25 y.o. G2P1001 at 4690w4d being seen today for ongoing prenatal care.  She is currently monitored for the following issues for this high-risk pregnancy and has Supervision of high risk pregnancy, antepartum; Chronic hypertension in pregnancy; and Previous cesarean section complicating pregnancy on her problem list.  Patient reports Patient does complain of some itching and rash on her B/L breasts. She has not put anything on teh breasts. It appears to be dry skin..  Contractions: Not present.  .  Movement: Present. Denies leaking of fluid.   The following portions of the patient's history were reviewed and updated as appropriate: allergies, current medications, past family history, past medical history, past social history, past surgical history and problem list. Problem list updated.  Objective:   Vitals:   04/24/16 1358  BP: (!) 147/78  Pulse: 83  Weight: (!) 337 lb 8 oz (153.1 kg)    Fetal Status: Fetal Heart Rate (bpm): 137   Movement: Present     General:  Alert, oriented and cooperative. Patient is in no acute distress.  Skin: Skin is warm and dry. No rash noted.   Cardiovascular: Normal heart rate noted  Respiratory: Normal respiratory effort, no problems with respiration noted  Abdomen: Soft, gravid, appropriate for gestational age. Pain/Pressure: Present     Pelvic:  Cervical exam deferred        Extremities: Normal range of motion.     Mental Status: Normal mood and affect. Normal behavior. Normal judgment and thought content.   Assessment and Plan:  Pregnancy: G2P1001 at 3590w4d  1. [redacted] weeks gestation of pregnancy -received flu shot - US MFM OB COMP + 14 WK; Future -early glucola reviewed. -AFP only ordered  2. Chronic hypertension in pregnancy -elevated Bp today, but patient has been out of her medications. -called in labetolol 100mg  BID with refills.  -check urine protein/glucose    Preterm labor symptoms  and general obstetric precautions including but not limited to vaginal bleeding, contractions, leaking of fluid and fetal movement were reviewed in detail with the patient. Please refer to After Visit Summary for other counseling recommendations.  No Follow-up on file.   Lorne SkeensNicholas Michael Emaan Gary, MD

## 2016-04-25 LAB — ALPHA FETOPROTEIN, MATERNAL
AFP: 38 ng/mL
CURR GEST AGE: 20.6 wk
MOM FOR AFP: 1.1
OPEN SPINA BIFIDA: NEGATIVE

## 2016-04-30 ENCOUNTER — Other Ambulatory Visit: Payer: Self-pay | Admitting: Obstetrics and Gynecology

## 2016-04-30 ENCOUNTER — Ambulatory Visit (HOSPITAL_COMMUNITY)
Admission: RE | Admit: 2016-04-30 | Discharge: 2016-04-30 | Disposition: A | Discharge status: Home / Self Care | Payer: Medicaid Other | Source: Ambulatory Visit | Attending: Obstetrics and Gynecology | Admitting: Obstetrics and Gynecology

## 2016-04-30 DIAGNOSIS — O10012 Pre-existing essential hypertension complicating pregnancy, second trimester: Secondary | ICD-10-CM | POA: Insufficient documentation

## 2016-04-30 DIAGNOSIS — O34211 Maternal care for low transverse scar from previous cesarean delivery: Secondary | ICD-10-CM | POA: Insufficient documentation

## 2016-04-30 DIAGNOSIS — Z3A21 21 weeks gestation of pregnancy: Secondary | ICD-10-CM | POA: Insufficient documentation

## 2016-04-30 DIAGNOSIS — Z3A20 20 weeks gestation of pregnancy: Secondary | ICD-10-CM

## 2016-04-30 DIAGNOSIS — O99212 Obesity complicating pregnancy, second trimester: Secondary | ICD-10-CM | POA: Insufficient documentation

## 2016-05-04 ENCOUNTER — Telehealth: Payer: Self-pay | Admitting: *Deleted

## 2016-05-04 DIAGNOSIS — O10919 Unspecified pre-existing hypertension complicating pregnancy, unspecified trimester: Secondary | ICD-10-CM

## 2016-05-04 DIAGNOSIS — O0992 Supervision of high risk pregnancy, unspecified, second trimester: Secondary | ICD-10-CM

## 2016-05-04 NOTE — Telephone Encounter (Signed)
Received message from Dr. Genevie AnnSchenk stating that pt needs follow US to complete anatomy assessment in 4-5 wks.  I called pt and left message for her to call back regarding a non-urgent message. Pt needs to be informed of US scheduled 06/06/16 @ 1245.

## 2016-05-07 NOTE — Telephone Encounter (Signed)
Notified of pt of US appt scheduled on 06/06/16 @ 1245.  Pt stated understanding with no further questions.

## 2016-05-23 ENCOUNTER — Encounter: Payer: Medicaid Other | Admitting: Obstetrics and Gynecology

## 2016-05-25 ENCOUNTER — Ambulatory Visit (INDEPENDENT_AMBULATORY_CARE_PROVIDER_SITE_OTHER): Payer: Medicaid Other | Admitting: Family Medicine

## 2016-05-25 ENCOUNTER — Encounter: Payer: Self-pay | Admitting: Family Medicine

## 2016-05-25 VITALS — BP 112/64 | HR 78 | Wt 341.0 lb

## 2016-05-25 DIAGNOSIS — O34219 Maternal care for unspecified type scar from previous cesarean delivery: Secondary | ICD-10-CM

## 2016-05-25 DIAGNOSIS — O10919 Unspecified pre-existing hypertension complicating pregnancy, unspecified trimester: Secondary | ICD-10-CM

## 2016-05-25 DIAGNOSIS — O10913 Unspecified pre-existing hypertension complicating pregnancy, third trimester: Secondary | ICD-10-CM

## 2016-05-25 DIAGNOSIS — O0990 Supervision of high risk pregnancy, unspecified, unspecified trimester: Secondary | ICD-10-CM

## 2016-05-25 NOTE — Progress Notes (Signed)
Pt will come to next visit fasting for her 2 hr gtt.

## 2016-05-25 NOTE — Progress Notes (Signed)
   PRENATAL VISIT NOTE  Subjective:  Terri Tran is a 26 y.o. G2P1001 at 5953w0d being seen today for ongoing prenatal care.  She is currently monitored for the following issues for this high-risk pregnancy and has Supervision of high risk pregnancy, antepartum; Chronic hypertension in pregnancy; and Previous cesarean section complicating pregnancy on her problem list.  Patient reports no complaints.  Contractions: Not present. Vag. Bleeding: None.  Movement: Present. Denies leaking of fluid.   The following portions of the patient's history were reviewed and updated as appropriate: allergies, current medications, past family history, past medical history, past social history, past surgical history and problem list. Problem list updated.  Objective:   Vitals:   05/25/16 1057  BP: 112/64  Pulse: 78  Weight: (!) 341 lb (154.7 kg)    Fetal Status: Fetal Heart Rate (bpm): 154   Movement: Present     General:  Alert, oriented and cooperative. Patient is in no acute distress.  Skin: Skin is warm and dry. No rash noted.   Cardiovascular: Normal heart rate noted  Respiratory: Normal respiratory effort, no problems with respiration noted  Abdomen: Soft, gravid, appropriate for gestational age. Pain/Pressure: Present     Pelvic:  Cervical exam deferred        Extremities: Normal range of motion.  Edema: Trace  Mental Status: Normal mood and affect. Normal behavior. Normal judgment and thought content.   Assessment and Plan:  Pregnancy: G2P1001 at 3153w0d  1. Chronic hypertension in pregnancy On labetalol and ASA--BP is well controlled--has u/s for growth on 06/06/16  2. Supervision of high risk pregnancy, antepartum 28 wk labs and TDaP next week  3. Previous cesarean section complicating pregnancy Desires TOLAC--info given--sign consent next visit  Preterm labor symptoms and general obstetric precautions including but not limited to vaginal bleeding, contractions, leaking of fluid and  fetal movement were reviewed in detail with the patient. Please refer to After Visit Summary for other counseling recommendations.  Return in 3 weeks (on 06/15/2016) for 28 wk labs.   Reva Boresanya S Marnell Mcdaniel, MD

## 2016-05-25 NOTE — Patient Instructions (Addendum)
Having a circumcision done in the hospital costs approximately $480.  This will have to be paid in full prior to circumcision being performed.  There are places to have circumcision done as an outpatient which are cheaper.    Circumcisions      Provider   Phone    Price     ------------------------------------------------------------------------------   [facility]  [phone]  $480 by 4 wks   [facility]   [phone]  $244 by 4 wks   [facility]   [phone]  $175 by 2 wks    [facility]   [phone]  $250 by 7 day [facility]   [phone]  $150 by 4 wks   Vaginal Birth After Cesarean Delivery Vaginal birth after cesarean delivery (VBAC) is giving birth vaginally after previously delivering a baby by a cesarean. In the past, if a woman had a cesarean delivery, all births afterward would be done by cesarean delivery. This is no longer true. It can be safe for the mother to try a vaginal delivery after having a cesarean delivery. It is important to discuss VBAC with your health care provider early in the pregnancy so you can understand the risks, benefits, and options. It will give you time to decide what is best in your particular case. The final decision about whether to have a VBAC or repeat cesarean delivery should be between you and your health care provider. Any changes in your health or your baby's health during your pregnancy may make it necessary to change your initial decision about VBAC. Women who plan to have a VBAC should check with their health care provider to be sure that:  The previous cesarean delivery was done with a low transverse uterine cut (incision) (not a vertical classical incision).  The birth canal is big enough for the baby.  There were no other operations on the uterus.  An electronic fetal monitor (EFM) will be on at all times during labor.  An operating room will be available and ready in case an emergency cesarean delivery is needed.  A health care provider and surgical nursing  staff will be available at all times during labor to be ready to do an emergency delivery cesarean if necessary.  An anesthesiologist will be present in case an emergency cesarean delivery is needed.  The nursery is prepared and has adequate personnel and necessary equipment available to care for the baby in case of an emergency cesarean delivery. Benefits of VBAC  Shorter stay in the hospital.  Avoidance of risks associated with cesarean delivery, such as:  Surgical complications, such as opening of the incision or hernia in the incision.  Injury to other organs.  Fever. This can occur if an infection develops after surgery. It can also occur as a reaction to the medicine given to make you numb during the surgery.  Less blood loss and need for blood transfusions.  Lower risk of blood clots and infection.  Shorter recovery.  Decreased risk for having to remove the uterus (hysterectomy).  Decreased risk for the placenta to completely or partially cover the opening of the uterus (placenta previa) with a future pregnancy.  Decrease risk in future labor and delivery. Risks of a VBAC  Tearing (rupture) of the uterus. This is occurs in less than 1% of VBACs. The risk of this happening is higher if:  Steps are taken to begin the labor process (induce labor) or stimulate or strengthen contractions (augment labor).  Medicine is used to soften (ripen) the cervix.  Having to remove the uterus (hysterectomy) if it ruptures. VBAC should not be done if:  The previous cesarean delivery was done with a vertical (classical) or T-shaped incision or you do not know what kind of incision was made.  You had a ruptured uterus.  You have had certain types of surgery on your uterus, such as removal of uterine fibroids. Ask your health care provider about other types of surgeries that prevent you from having a VBAC.  You have certain medical or childbirth (obstetrical) problems.  There are  problems with the baby.  You have had two previous cesarean deliveries and no vaginal deliveries. Other facts to know about VBAC:  It is safe to have an epidural anesthetic with VBAC.  It is safe to turn the baby from a breech position (attempt an external cephalic version).  It is safe to try a VBAC with twins.  VBAC may not be successful if your baby weights 8.8 lb (4 kg) or more. However, weight predictions are not always accurate and should not be used alone to decide if VBAC is right for you.  There is an increased failure rate if the time between the cesarean delivery and VBAC is less than 19 months.  Your health care provider may advise against a VBAC if you have preeclampsia (high blood pressure, protein in the urine, and swelling of face and extremities).  VBAC is often successful if you previously gave birth vaginally.  VBAC is often successful when the labor starts spontaneously before the due date.  Delivering a baby through a VBAC is similar to having a normal spontaneous vaginal delivery. This information is not intended to replace advice given to you by your health care provider. Make sure you discuss any questions you have with your health care provider. Document Released: 10/28/2006 Document Revised: 10/13/2015 Document Reviewed: 12/04/2012 Elsevier Interactive Patient Education  2017 [employer].   Breastfeeding Deciding to breastfeed is one of the best choices you can make for you and your baby. A change in hormones during pregnancy causes your breast tissue to grow and increases the number and size of your milk ducts. These hormones also allow proteins, sugars, and fats from your blood supply to make breast milk in your milk-producing glands. Hormones prevent breast milk from being released before your baby is born as well as prompt milk flow after birth. Once breastfeeding has begun, thoughts of your baby, as well as his or her sucking or crying, can stimulate the  release of milk from your milk-producing glands. Benefits of breastfeeding For Your Baby  Your first milk (colostrum) helps your baby's digestive system function better.  There are antibodies in your milk that help your baby fight off infections.  Your baby has a lower incidence of asthma, allergies, and sudden infant death syndrome.  The nutrients in breast milk are better for your baby than infant formulas and are designed uniquely for your baby's needs.  Breast milk improves your baby's brain development.  Your baby is less likely to develop other conditions, such as childhood obesity, asthma, or type 2 diabetes mellitus. For You  Breastfeeding helps to create a very special bond between you and your baby.  Breastfeeding is convenient. Breast milk is always available at the correct temperature and costs nothing.  Breastfeeding helps to burn calories and helps you lose the weight gained during pregnancy.  Breastfeeding makes your uterus contract to its prepregnancy size faster and slows bleeding (lochia) after you give birth.  Breastfeeding  helps to lower your risk of developing type 2 diabetes mellitus, osteoporosis, and breast or ovarian cancer later in life. Signs that your baby is hungry Early Signs of Hunger  Increased alertness or activity.  Stretching.  Movement of the head from side to side.  Movement of the head and opening of the mouth when the corner of the mouth or cheek is stroked (rooting).  Increased sucking sounds, smacking lips, cooing, sighing, or squeaking.  Hand-to-mouth movements.  Increased sucking of fingers or hands. Late Signs of Hunger  Fussing.  Intermittent crying. Extreme Signs of Hunger  Signs of extreme hunger will require calming and consoling before your baby will be able to breastfeed successfully. Do not wait for the following signs of extreme hunger to occur before you initiate breastfeeding:  Restlessness.  A loud, strong  cry.  Screaming. Breastfeeding basics  Breastfeeding Initiation  Find a comfortable place to sit or lie down, with your neck and back well supported.  Place a pillow or rolled up blanket under your baby to bring him or her to the level of your breast (if you are seated). Nursing pillows are specially designed to help support your arms and your baby while you breastfeed.  Make sure that your baby's abdomen is facing your abdomen.  Gently massage your breast. With your fingertips, massage from your chest wall toward your nipple in a circular motion. This encourages milk flow. You may need to continue this action during the feeding if your milk flows slowly.  Support your breast with 4 fingers underneath and your thumb above your nipple. Make sure your fingers are well away from your nipple and your baby's mouth.  Stroke your baby's lips gently with your finger or nipple.  When your baby's mouth is open wide enough, quickly bring your baby to your breast, placing your entire nipple and as much of the colored area around your nipple (areola) as possible into your baby's mouth.  More areola should be visible above your baby's upper lip than below the lower lip.  Your baby's tongue should be between his or her lower gum and your breast.  Ensure that your baby's mouth is correctly positioned around your nipple (latched). Your baby's lips should create a seal on your breast and be turned out (everted).  It is common for your baby to suck about 2-3 minutes in order to start the flow of breast milk. Latching  Teaching your baby how to latch on to your breast properly is very important. An improper latch can cause nipple pain and decreased milk supply for you and poor weight gain in your baby. Also, if your baby is not latched onto your nipple properly, he or she may swallow some air during feeding. This can make your baby fussy. Burping your baby when you switch breasts during the feeding can help  to get rid of the air. However, teaching your baby to latch on properly is still the best way to prevent fussiness from swallowing air while breastfeeding. Signs that your baby has successfully latched on to your nipple:  Silent tugging or silent sucking, without causing you pain.  Swallowing heard between every 3-4 sucks.  Muscle movement above and in front of his or her ears while sucking. Signs that your baby has not successfully latched on to nipple:  Sucking sounds or smacking sounds from your baby while breastfeeding.  Nipple pain. If you think your baby has not latched on correctly, slip your finger into the corner  of your baby's mouth to break the suction and place it between your baby's gums. Attempt breastfeeding initiation again. Signs of Successful Breastfeeding  Signs from your baby:  A gradual decrease in the number of sucks or complete cessation of sucking.  Falling asleep.  Relaxation of his or her body.  Retention of a small amount of milk in his or her mouth.  Letting go of your breast by himself or herself. Signs from you:  Breasts that have increased in firmness, weight, and size 1-3 hours after feeding.  Breasts that are softer immediately after breastfeeding.  Increased milk volume, as well as a change in milk consistency and color by the fifth day of breastfeeding.  Nipples that are not sore, cracked, or bleeding. Signs That Your Pecola Leisure is Getting Enough Milk  Wetting at least 1-2 diapers during the first 24 hours after birth.  Wetting at least 5-6 diapers every 24 hours for the first week after birth. The urine should be clear or pale yellow by 5 days after birth.  Wetting 6-8 diapers every 24 hours as your baby continues to grow and develop.  At least 3 stools in a 24-hour period by age 218 days. The stool should be soft and yellow.  At least 3 stools in a 24-hour period by age 21 days. The stool should be seedy and yellow.  No loss of weight greater  than 10% of birth weight during the first 3 days of age.  Average weight gain of 4-7 ounces (113-198 g) per week after age 20 days.  Consistent daily weight gain by age 218 days, without weight loss after the age of 2 weeks. After a feeding, your baby may spit up a small amount. This is common. Breastfeeding frequency and duration Frequent feeding will help you make more milk and can prevent sore nipples and breast engorgement. Breastfeed when you feel the need to reduce the fullness of your breasts or when your baby shows signs of hunger. This is called "breastfeeding on demand." Avoid introducing a pacifier to your baby while you are working to establish breastfeeding (the first 4-6 weeks after your baby is born). After this time you may choose to use a pacifier. Research has shown that pacifier use during the first year of a baby's life decreases the risk of sudden infant death syndrome (SIDS). Allow your baby to feed on each breast as long as he or she wants. Breastfeed until your baby is finished feeding. When your baby unlatches or falls asleep while feeding from the first breast, offer the second breast. Because newborns are often sleepy in the first few weeks of life, you may need to awaken your baby to get him or her to feed. Breastfeeding times will vary from baby to baby. However, the following rules can serve as a guide to help you ensure that your baby is properly fed:  Newborns (babies 47 weeks of age or younger) may breastfeed every 1-3 hours.  Newborns should not go longer than 3 hours during the day or 5 hours during the night without breastfeeding.  You should breastfeed your baby a minimum of 8 times in a 24-hour period until you begin to introduce solid foods to your baby at around 76 months of age. Breast milk pumping Pumping and storing breast milk allows you to ensure that your baby is exclusively fed your breast milk, even at times when you are unable to breastfeed. This is  especially important if you are going back to  work while you are still breastfeeding or when you are not able to be present during feedings. Your lactation consultant can give you guidelines on how long it is safe to store breast milk. A breast pump is a machine that allows you to pump milk from your breast into a sterile bottle. The pumped breast milk can then be stored in a refrigerator or freezer. Some breast pumps are operated by hand, while others use electricity. Ask your lactation consultant which type will work best for you. Breast pumps can be purchased, but some hospitals and breastfeeding support groups lease breast pumps on a monthly basis. A lactation consultant can teach you how to hand express breast milk, if you prefer not to use a pump. Caring for your breasts while you breastfeed Nipples can become dry, cracked, and sore while breastfeeding. The following recommendations can help keep your breasts moisturized and healthy:  Avoid using soap on your nipples.  Wear a supportive bra. Although not required, special nursing bras and tank tops are designed to allow access to your breasts for breastfeeding without taking off your entire bra or top. Avoid wearing underwire-style bras or extremely tight bras.  Air dry your nipples for 3-16minutes after each feeding.  Use only cotton bra pads to absorb leaked breast milk. Leaking of breast milk between feedings is normal.  Use lanolin on your nipples after breastfeeding. Lanolin helps to maintain your skin's normal moisture barrier. If you use pure lanolin, you do not need to wash it off before feeding your baby again. Pure lanolin is not toxic to your baby. You may also hand express a few drops of breast milk and gently massage that milk into your nipples and allow the milk to air dry. In the first few weeks after giving birth, some women experience extremely full breasts (engorgement). Engorgement can make your breasts feel heavy, warm, and  tender to the touch. Engorgement peaks within 3-5 days after you give birth. The following recommendations can help ease engorgement:  Completely empty your breasts while breastfeeding or pumping. You may want to start by applying warm, moist heat (in the shower or with warm water-soaked hand towels) just before feeding or pumping. This increases circulation and helps the milk flow. If your baby does not completely empty your breasts while breastfeeding, pump any extra milk after he or she is finished.  Wear a snug bra (nursing or regular) or tank top for 1-2 days to signal your body to slightly decrease milk production.  Apply ice packs to your breasts, unless this is too uncomfortable for you.  Make sure that your baby is latched on and positioned properly while breastfeeding. If engorgement persists after 48 hours of following these recommendations, contact your health care provider or a Advertising copywriter. Overall health care recommendations while breastfeeding  Eat healthy foods. Alternate between meals and snacks, eating 3 of each per day. Because what you eat affects your breast milk, some of the foods may make your baby more irritable than usual. Avoid eating these foods if you are sure that they are negatively affecting your baby.  Drink milk, fruit juice, and water to satisfy your thirst (about 10 glasses a day).  Rest often, relax, and continue to take your prenatal vitamins to prevent fatigue, stress, and anemia.  Continue breast self-awareness checks.  Avoid chewing and smoking tobacco. Chemicals from cigarettes that pass into breast milk and exposure to secondhand smoke may harm your baby.  Avoid alcohol and drug use, including  marijuana. Some medicines that may be harmful to your baby can pass through breast milk. It is important to ask your health care provider before taking any medicine, including all over-the-counter and prescription medicine as well as vitamin and herbal  supplements. It is possible to become pregnant while breastfeeding. If birth control is desired, ask your health care provider about options that will be safe for your baby. Contact a health care provider if:  You feel like you want to stop breastfeeding or have become frustrated with breastfeeding.  You have painful breasts or nipples.  Your nipples are cracked or bleeding.  Your breasts are red, tender, or warm.  You have a swollen area on either breast.  You have a fever or chills.  You have nausea or vomiting.  You have drainage other than breast milk from your nipples.  Your breasts do not become full before feedings by the fifth day after you give birth.  You feel sad and depressed.  Your baby is too sleepy to eat well.  Your baby is having trouble sleeping.  Your baby is wetting less than 3 diapers in a 24-hour period.  Your baby has less than 3 stools in a 24-hour period.  Your baby's skin or the white part of his or her eyes becomes yellow.  Your baby is not gaining weight by 60 days of age. Get help right away if:  Your baby is overly tired (lethargic) and does not want to wake up and feed.  Your baby develops an unexplained fever. This information is not intended to replace advice given to you by your health care provider. Make sure you discuss any questions you have with your health care provider. Document Released: 05/07/2005 Document Revised: 10/19/2015 Document Reviewed: 10/29/2012 Elsevier Interactive Patient Education  2017 [employer].

## 2016-06-06 ENCOUNTER — Encounter (HOSPITAL_COMMUNITY): Payer: Self-pay

## 2016-06-06 ENCOUNTER — Other Ambulatory Visit (HOSPITAL_COMMUNITY): Payer: Self-pay | Admitting: Maternal and Fetal Medicine

## 2016-06-06 ENCOUNTER — Ambulatory Visit (HOSPITAL_COMMUNITY)
Admission: RE | Admit: 2016-06-06 | Discharge: 2016-06-06 | Disposition: A | Discharge status: Home / Self Care | Payer: Medicaid Other | Source: Ambulatory Visit | Attending: Obstetrics and Gynecology | Admitting: Obstetrics and Gynecology

## 2016-06-06 DIAGNOSIS — O10012 Pre-existing essential hypertension complicating pregnancy, second trimester: Secondary | ICD-10-CM | POA: Insufficient documentation

## 2016-06-06 DIAGNOSIS — Z3A26 26 weeks gestation of pregnancy: Secondary | ICD-10-CM | POA: Diagnosis not present

## 2016-06-06 DIAGNOSIS — O99212 Obesity complicating pregnancy, second trimester: Secondary | ICD-10-CM | POA: Insufficient documentation

## 2016-06-06 DIAGNOSIS — O34211 Maternal care for low transverse scar from previous cesarean delivery: Secondary | ICD-10-CM | POA: Insufficient documentation

## 2016-06-06 DIAGNOSIS — O10919 Unspecified pre-existing hypertension complicating pregnancy, unspecified trimester: Secondary | ICD-10-CM

## 2016-06-06 DIAGNOSIS — Z362 Encounter for other antenatal screening follow-up: Secondary | ICD-10-CM | POA: Insufficient documentation

## 2016-06-06 DIAGNOSIS — O0992 Supervision of high risk pregnancy, unspecified, second trimester: Secondary | ICD-10-CM

## 2016-06-07 ENCOUNTER — Other Ambulatory Visit (HOSPITAL_COMMUNITY): Payer: Self-pay | Admitting: *Deleted

## 2016-06-07 DIAGNOSIS — O10919 Unspecified pre-existing hypertension complicating pregnancy, unspecified trimester: Secondary | ICD-10-CM

## 2016-06-15 ENCOUNTER — Other Ambulatory Visit: Payer: Medicaid Other

## 2016-06-15 DIAGNOSIS — O0990 Supervision of high risk pregnancy, unspecified, unspecified trimester: Secondary | ICD-10-CM

## 2016-06-15 DIAGNOSIS — Z23 Encounter for immunization: Secondary | ICD-10-CM

## 2016-06-15 LAB — CBC
HEMATOCRIT: 34.7 % — AB (ref 35.0–45.0)
HEMOGLOBIN: 11.4 g/dL — AB (ref 11.7–15.5)
MCH: 28.2 pg (ref 27.0–33.0)
MCHC: 32.9 g/dL (ref 32.0–36.0)
MCV: 85.9 fL (ref 80.0–100.0)
MPV: 9 fL (ref 7.5–12.5)
Platelets: 361 10*3/uL (ref 140–400)
RBC: 4.04 MIL/uL (ref 3.80–5.10)
RDW: 13.5 % (ref 11.0–15.0)
WBC: 10.3 10*3/uL (ref 3.8–10.8)

## 2016-06-16 LAB — 2HR GTT W 1 HR, CARPENTER, 75 G
GLUCOSE, 1 HR, GEST: 82 mg/dL (ref <180)
GLUCOSE, FASTING, GEST: 77 mg/dL (ref 65–91)
Glucose, 2 Hr, Gest: 55 mg/dL — ABNORMAL LOW (ref <153)

## 2016-06-16 LAB — RPR

## 2016-06-16 LAB — HIV ANTIBODY (ROUTINE TESTING W REFLEX): HIV 1&2 Ab, 4th Generation: NONREACTIVE

## 2016-06-27 ENCOUNTER — Ambulatory Visit (INDEPENDENT_AMBULATORY_CARE_PROVIDER_SITE_OTHER): Payer: Medicaid Other | Admitting: Obstetrics and Gynecology

## 2016-06-27 VITALS — BP 123/88 | HR 98 | Wt 339.8 lb

## 2016-06-27 DIAGNOSIS — O0990 Supervision of high risk pregnancy, unspecified, unspecified trimester: Secondary | ICD-10-CM

## 2016-06-27 DIAGNOSIS — Z23 Encounter for immunization: Secondary | ICD-10-CM

## 2016-06-27 DIAGNOSIS — O10913 Unspecified pre-existing hypertension complicating pregnancy, third trimester: Secondary | ICD-10-CM | POA: Diagnosis not present

## 2016-06-27 DIAGNOSIS — O34219 Maternal care for unspecified type scar from previous cesarean delivery: Secondary | ICD-10-CM | POA: Diagnosis not present

## 2016-06-27 DIAGNOSIS — O10919 Unspecified pre-existing hypertension complicating pregnancy, unspecified trimester: Secondary | ICD-10-CM

## 2016-06-27 LAB — POCT URINALYSIS DIP (DEVICE)
Glucose, UA: NEGATIVE mg/dL
Hgb urine dipstick: NEGATIVE
KETONES UR: NEGATIVE mg/dL
Leukocytes, UA: NEGATIVE
Nitrite: NEGATIVE
PH: 6 (ref 5.0–8.0)
PROTEIN: NEGATIVE mg/dL
UROBILINOGEN UA: 0.2 mg/dL (ref 0.0–1.0)

## 2016-06-27 NOTE — Progress Notes (Signed)
   PRENATAL VISIT NOTE  Subjective:  Terri Tran is a 26 y.o. G2P1001 at 6866w5d being seen today for ongoing prenatal care.  She is currently monitored for the following issues for this high-risk pregnancy and has Supervision of high risk pregnancy, antepartum; Chronic hypertension in pregnancy; and Previous cesarean section complicating pregnancy on her problem list.  Patient reports no complaints.  Contractions: Not present. Vag. Bleeding: None.  Movement: Present. Denies leaking of fluid.   The following portions of the patient's history were reviewed and updated as appropriate: allergies, current medications, past family history, past medical history, past social history, past surgical history and problem list. Problem list updated.  Objective:   Vitals:   06/27/16 1323  BP: 123/88  Pulse: 98  Weight: (!) 339 lb 12.8 oz (154.1 kg)    Fetal Status: Fetal Heart Rate (bpm): 135   Movement: Present     General:  Alert, oriented and cooperative. Patient is in no acute distress.  Skin: Skin is warm and dry. No rash noted.   Cardiovascular: Normal heart rate noted  Respiratory: Normal respiratory effort, no problems with respiration noted  Abdomen: Soft, gravid, appropriate for gestational age. Pain/Pressure: Present     Pelvic:  Cervical exam deferred        Extremities: Normal range of motion.  Edema: None  Mental Status: Normal mood and affect. Normal behavior. Normal judgment and thought content.   Assessment and Plan:  Pregnancy: G2P1001 at 6866w5d  1. Supervision of high risk pregnancy, antepartum Patient is doing well without complaints TDap today  2. Chronic hypertension in pregnancy Continue labetalol and ASA  3. Previous cesarean section complicating pregnancy Desires TOLAC- consent signed  Preterm labor symptoms and general obstetric precautions including but not limited to vaginal bleeding, contractions, leaking of fluid and fetal movement were reviewed in detail  with the patient. Please refer to After Visit Summary for other counseling recommendations.  Return in about 2 weeks (around 07/11/2016).   Catalina AntiguaPeggy Amar Keenum, MD

## 2016-07-04 ENCOUNTER — Ambulatory Visit (HOSPITAL_COMMUNITY): Payer: Medicaid Other

## 2016-07-11 ENCOUNTER — Ambulatory Visit (HOSPITAL_COMMUNITY)
Admission: RE | Admit: 2016-07-11 | Discharge: 2016-07-11 | Disposition: A | Discharge status: Home / Self Care | Payer: Medicaid Other | Source: Ambulatory Visit | Attending: Obstetrics and Gynecology | Admitting: Obstetrics and Gynecology

## 2016-07-11 ENCOUNTER — Encounter: Payer: Medicaid Other | Admitting: Obstetrics and Gynecology

## 2016-07-11 ENCOUNTER — Encounter (HOSPITAL_COMMUNITY): Payer: Self-pay

## 2016-07-11 ENCOUNTER — Telehealth: Payer: Self-pay | Admitting: Family Medicine

## 2016-07-11 ENCOUNTER — Encounter: Payer: Self-pay | Admitting: Obstetrics and Gynecology

## 2016-07-11 ENCOUNTER — Other Ambulatory Visit (HOSPITAL_COMMUNITY): Payer: Self-pay | Admitting: Maternal and Fetal Medicine

## 2016-07-11 VITALS — BP 129/92 | HR 87 | Wt 335.2 lb

## 2016-07-11 DIAGNOSIS — Z362 Encounter for other antenatal screening follow-up: Secondary | ICD-10-CM | POA: Diagnosis not present

## 2016-07-11 DIAGNOSIS — O34219 Maternal care for unspecified type scar from previous cesarean delivery: Secondary | ICD-10-CM

## 2016-07-11 DIAGNOSIS — O10013 Pre-existing essential hypertension complicating pregnancy, third trimester: Secondary | ICD-10-CM | POA: Diagnosis present

## 2016-07-11 DIAGNOSIS — O99213 Obesity complicating pregnancy, third trimester: Secondary | ICD-10-CM | POA: Insufficient documentation

## 2016-07-11 DIAGNOSIS — O34211 Maternal care for low transverse scar from previous cesarean delivery: Secondary | ICD-10-CM | POA: Diagnosis not present

## 2016-07-11 DIAGNOSIS — Z3A31 31 weeks gestation of pregnancy: Secondary | ICD-10-CM

## 2016-07-11 DIAGNOSIS — O10919 Unspecified pre-existing hypertension complicating pregnancy, unspecified trimester: Secondary | ICD-10-CM

## 2016-07-11 NOTE — Progress Notes (Signed)
Patient came in person to cancel upcoming and all future appointments with clinic, patient stated that she is unable to keep or schedule any new appointments due to flu restriction and no childcare.

## 2016-07-11 NOTE — Telephone Encounter (Signed)
error 

## 2016-08-02 ENCOUNTER — Ambulatory Visit (INDEPENDENT_AMBULATORY_CARE_PROVIDER_SITE_OTHER): Payer: Medicaid Other | Admitting: Family Medicine

## 2016-08-02 ENCOUNTER — Ambulatory Visit (HOSPITAL_COMMUNITY)
Admission: RE | Admit: 2016-08-02 | Discharge: 2016-08-02 | Disposition: A | Discharge status: Home / Self Care | Payer: Medicaid Other | Source: Ambulatory Visit | Attending: Obstetrics and Gynecology | Admitting: Obstetrics and Gynecology

## 2016-08-02 ENCOUNTER — Encounter (HOSPITAL_COMMUNITY): Payer: Self-pay

## 2016-08-02 VITALS — BP 131/70 | HR 92 | Wt 335.0 lb

## 2016-08-02 DIAGNOSIS — Z3A34 34 weeks gestation of pregnancy: Secondary | ICD-10-CM | POA: Diagnosis not present

## 2016-08-02 DIAGNOSIS — O10013 Pre-existing essential hypertension complicating pregnancy, third trimester: Secondary | ICD-10-CM | POA: Insufficient documentation

## 2016-08-02 DIAGNOSIS — O10919 Unspecified pre-existing hypertension complicating pregnancy, unspecified trimester: Secondary | ICD-10-CM

## 2016-08-02 DIAGNOSIS — O99213 Obesity complicating pregnancy, third trimester: Secondary | ICD-10-CM | POA: Diagnosis not present

## 2016-08-02 DIAGNOSIS — O34219 Maternal care for unspecified type scar from previous cesarean delivery: Secondary | ICD-10-CM

## 2016-08-02 DIAGNOSIS — O10913 Unspecified pre-existing hypertension complicating pregnancy, third trimester: Secondary | ICD-10-CM

## 2016-08-02 DIAGNOSIS — O34211 Maternal care for low transverse scar from previous cesarean delivery: Secondary | ICD-10-CM | POA: Insufficient documentation

## 2016-08-02 DIAGNOSIS — O0990 Supervision of high risk pregnancy, unspecified, unspecified trimester: Secondary | ICD-10-CM

## 2016-08-02 DIAGNOSIS — Z362 Encounter for other antenatal screening follow-up: Secondary | ICD-10-CM | POA: Diagnosis not present

## 2016-08-02 NOTE — Progress Notes (Signed)
   PRENATAL VISIT NOTE  Subjective:  Terri Tran is a 26 y.o. G2P1001 at 6481w6d being seen today for ongoing prenatal care.  She is currently monitored for the following issues for this high-risk pregnancy and has Supervision of high risk pregnancy, antepartum; Chronic hypertension in pregnancy; and Previous cesarean section complicating pregnancy on her problem list.  Patient reports no complaints.  Contractions: Irritability. Vag. Bleeding: None.  Movement: Present. Denies leaking of fluid.   The following portions of the patient's history were reviewed and updated as appropriate: allergies, current medications, past family history, past medical history, past social history, past surgical history and problem list. Problem list updated.  Objective:   Vitals:   08/02/16 1503  BP: 131/70  Pulse: 92  Weight: (!) 335 lb (152 kg)    Fetal Status: Fetal Heart Rate (bpm): 151   Movement: Present  Presentation: Vertex  General:  Alert, oriented and cooperative. Patient is in no acute distress.  Skin: Skin is warm and dry. No rash noted.   Cardiovascular: Normal heart rate noted  Respiratory: Normal respiratory effort, no problems with respiration noted  Abdomen: Soft, gravid, appropriate for gestational age. Pain/Pressure: Present     Pelvic:  Cervical exam deferred        Extremities: Normal range of motion.  Edema: None  Mental Status: Normal mood and affect. Normal behavior. Normal judgment and thought content.   Assessment and Plan:  Pregnancy: G2P1001 at 7281w6d  1. Supervision of high risk pregnancy, antepartum FHT normal. FH measuring ahead due to maternal body habitus. - US MFM FETAL BPP WO NON STRESS; Future  2. Chronic hypertension in pregnancy On Labetalol 200mg  BID. Continue ASA 81mg  Daily x 1 more week, then stop. US today - will add on BPP. Start NST next week. - US MFM FETAL BPP WO NON STRESS; Future  3. Previous cesarean section complicating pregnancy Continues to  want TOLAC - US MFM FETAL BPP WO NON STRESS; Future  Preterm labor symptoms and general obstetric precautions including but not limited to vaginal bleeding, contractions, leaking of fluid and fetal movement were reviewed in detail with the patient. Please refer to After Visit Summary for other counseling recommendations.  Return in about 1 week (around 08/09/2016) for HR OB f/u.   Levie HeritageJacob J Parisha Beaulac, DO

## 2016-08-09 ENCOUNTER — Telehealth: Payer: Self-pay | Admitting: *Deleted

## 2016-08-09 ENCOUNTER — Other Ambulatory Visit: Payer: Medicaid Other | Admitting: Family Medicine

## 2016-08-09 ENCOUNTER — Encounter: Payer: Self-pay | Admitting: Family Medicine

## 2016-08-09 NOTE — Telephone Encounter (Signed)
Called pt regarding her missed appt today @ 1440. She stated that her child has been @ the dentist for 4 hours and she will not be able to keep appt. She would like to reschedule for next week. Pt endorses good FM daily and she is having no problems. Pt will be called by scheduling staff with next appt information. Pt voiced understanding.

## 2016-08-13 ENCOUNTER — Other Ambulatory Visit: Payer: Medicaid Other | Admitting: Obstetrics & Gynecology

## 2016-08-14 ENCOUNTER — Telehealth: Payer: Self-pay | Admitting: *Deleted

## 2016-08-14 NOTE — Telephone Encounter (Signed)
Called pt and left message stating that she missed an appointment yesterday and I am calling to check on her to see if everything is ok. She has next appt on 3/29 @ 1240 and this appt is very important for her prenatal care. We will be checking on the baby that Phyliss Hulick. Please call our office if she has questions regarding this message. Otherwise we will see her on 3/29.

## 2016-08-15 ENCOUNTER — Telehealth: Payer: Self-pay | Admitting: Family Medicine

## 2016-08-15 NOTE — Telephone Encounter (Signed)
Patient called today to cancel her Appt for tomorrow 08-16-2016, she said have a lot going on and don't think she will come back, she said she will call next week to try and get an appt if she feel she need it.

## 2016-08-16 ENCOUNTER — Other Ambulatory Visit: Payer: Medicaid Other | Admitting: Family Medicine

## 2016-08-22 ENCOUNTER — Telehealth: Payer: Self-pay | Admitting: Obstetrics and Gynecology

## 2016-08-22 NOTE — Telephone Encounter (Signed)
Patient called to say her water is breaking. She stated some white stuff was coming out, and she was hurting really bad. Pains were coming every 5-6 minutes. However, she was not in town. I informed her she should go to the hospital where she was at. She stated she did not want to because her medicaid won't cover the bill. She will go anyway.

## 2016-08-27 ENCOUNTER — Telehealth: Payer: Self-pay | Admitting: Obstetrics & Gynecology

## 2016-08-27 NOTE — Telephone Encounter (Signed)
Patient called to say she was suppose to be getting induced on this Friday. She stated she needed to know what to do, because coming twice a week was foolish, and was not necessary. When I asked her who told her she would be induced this Friday. She hung up the phone.

## 2016-08-28 ENCOUNTER — Other Ambulatory Visit: Payer: Self-pay | Admitting: Obstetrics and Gynecology

## 2016-08-28 ENCOUNTER — Other Ambulatory Visit (HOSPITAL_COMMUNITY)
Admission: RE | Admit: 2016-08-28 | Discharge: 2016-08-28 | Disposition: A | Discharge status: Home / Self Care | Payer: Medicaid Other | Source: Ambulatory Visit | Attending: Obstetrics and Gynecology | Admitting: Obstetrics and Gynecology

## 2016-08-28 ENCOUNTER — Ambulatory Visit (INDEPENDENT_AMBULATORY_CARE_PROVIDER_SITE_OTHER): Payer: Medicaid Other | Admitting: Obstetrics and Gynecology

## 2016-08-28 ENCOUNTER — Encounter: Payer: Self-pay | Admitting: Obstetrics and Gynecology

## 2016-08-28 VITALS — BP 135/67 | HR 89 | Wt 332.9 lb

## 2016-08-28 DIAGNOSIS — O99210 Obesity complicating pregnancy, unspecified trimester: Secondary | ICD-10-CM | POA: Insufficient documentation

## 2016-08-28 DIAGNOSIS — O0993 Supervision of high risk pregnancy, unspecified, third trimester: Secondary | ICD-10-CM | POA: Diagnosis not present

## 2016-08-28 DIAGNOSIS — O0930 Supervision of pregnancy with insufficient antenatal care, unspecified trimester: Secondary | ICD-10-CM | POA: Insufficient documentation

## 2016-08-28 DIAGNOSIS — Z113 Encounter for screening for infections with a predominantly sexual mode of transmission: Secondary | ICD-10-CM

## 2016-08-28 DIAGNOSIS — O0990 Supervision of high risk pregnancy, unspecified, unspecified trimester: Secondary | ICD-10-CM | POA: Diagnosis present

## 2016-08-28 DIAGNOSIS — O10913 Unspecified pre-existing hypertension complicating pregnancy, third trimester: Secondary | ICD-10-CM

## 2016-08-28 DIAGNOSIS — O10919 Unspecified pre-existing hypertension complicating pregnancy, unspecified trimester: Secondary | ICD-10-CM | POA: Diagnosis present

## 2016-08-28 DIAGNOSIS — Z6841 Body Mass Index (BMI) 40.0 and over, adult: Secondary | ICD-10-CM | POA: Insufficient documentation

## 2016-08-28 DIAGNOSIS — O99820 Streptococcus B carrier state complicating pregnancy: Secondary | ICD-10-CM | POA: Insufficient documentation

## 2016-08-28 DIAGNOSIS — Z3A38 38 weeks gestation of pregnancy: Secondary | ICD-10-CM | POA: Insufficient documentation

## 2016-08-28 LAB — POCT URINALYSIS DIP (DEVICE)
GLUCOSE, UA: NEGATIVE mg/dL
Hgb urine dipstick: NEGATIVE
Ketones, ur: 15 mg/dL — AB
NITRITE: NEGATIVE
PROTEIN: 30 mg/dL — AB
Specific Gravity, Urine: >=1.03 (ref 1.005–1.030)
UROBILINOGEN UA: 2 mg/dL — AB (ref 0.0–1.0)
pH: 6 (ref 5.0–8.0)

## 2016-08-28 NOTE — H&P (Addendum)
Obstetrics Admission History & Physical  08/28/2016 - 3:17 PM Primary OBGYN: [facility]  Chief Complaint: IOL for CHTN  History of Present Illness  26 y.o. G2P1001 @ [redacted]w[redacted]d, with the above CC. Pregnancy complicated by: cHTN (on labetalol 100 bid), BMI low 50s, h/o c-section x 1, scant PNC.  Terri Tran states that she has no decreased FM or labor s/s.   Review of Systems:  as noted in the History of Present Illness.  PMHx:  Past Medical History:  Diagnosis Date  . Hypertension    PSHx:  Past Surgical History:  Procedure Laterality Date  . CESAREAN SECTION     Medications: labetalol 100 bid (sometimes she forgets the pm dose), PNV  Allergies: has No Known Allergies. OBHx:  OB History  Gravida Para Term Preterm AB Living  SAB TAB Ectopic Multiple Live Births               # Outcome Date GA Lbr Len/2nd Weight Sex Delivery Anes PTL Lv  2 Current           1 Term 08/08/11 [redacted]w[redacted]d   F CS-LTranv        07/2011 primary c-section at term. Patient states it was a scheduled c-section due to her weight; she states she never got to pushing or was in labor, the fetus did not have malpresenation. In [place], [place] at [facility]. Patient never told she couldn't labor.       FHx:  Family History  Problem Relation Age of Onset  . Hypertension Mother    Soc Hx:  Social History   Social History  . Marital status: Single    Spouse name: N/A  . Number of children: N/A  . Years of education: N/A   Occupational History  . Not on file.   Social History Main Topics  . Smoking status: Former Smoker    Packs/day: 0.50    Types: Cigarettes  . Smokeless tobacco: Current User  . Alcohol use No  . Drug use: No  . Sexual activity: Yes    Birth control/ protection: None   Other Topics Concern  . Not on file   Social History Narrative  . No narrative on file    Objective  135/67 89 332 lbs FHT:145 baseline, +accels, no decel, mod  var Toco: one or two UCs  General: Well nourished, well developed female in no acute distress.  Skin:  Warm and dry.  Cardiovascular: S1, S2 normal, no murmur, rub or gallop, regular rate and rhythm Respiratory:  Clear to auscultation bilateral. Normal respiratory effort Abdomen: obese, soft, nttp, gravid Neuro/Psych:  Normal mood and affect.   SVE: FT/25/high  Labs  GBS, GC/CT collected.  Radiology 4/10: bedside u/s cephalic, AFI 8, normal FHR 3/15: cephalic, AFI 16, 48%, 2379gm, AC 47%, 8/8 BPP  Perinatal info  ABO, Rh: A/POS/-- (09/14 0001) Antibody: NEG (09/14 0001) Rubella: 1.02 (09/14 0001) RPR: NON REAC (01/26 1013)  HBsAg: NEGATIVE (09/14 0001)  HIV: NONREACTIVE (01/26 1013)  GBS: collected today 2 hr GTT: negative Tdap: needed PP Pap: negative 2017  Assessment & Plan   26 y.o. G2P1001 @ [redacted]w[redacted]d with cHTN. Pt doing well *Pregnancy: fetal status reassuring. rNST and normal AFI today.  *cHTN: ; pt on labetalol bid at her NOB at 8wks.  *IOL: set up for 4/13 @ 0730. D/w her re: tolac vs rpt. c-section, risk of rupture, etc and how  weight can effect management if rupture does occur, unproven pelvis, etc; pt would like to try for tolac. Likely foley bulb IOL given cx.  *h/o prior c-section: MR request sent again; see above. TOLAC consent already signed *GBS: collected today *Analgesia: recommended early epidural given TOLAC, BMI *BMI 50s: see above. No change in plan of care *scant PNC: UDS on admission.   Cornelia Copa MD Attending [facility])

## 2016-08-28 NOTE — Progress Notes (Signed)
Scheduled for IOL at 39 weeks for 08/31/16. AFI completed by Dr. Vergie Living.

## 2016-08-28 NOTE — Progress Notes (Signed)
See h&p for rob note.

## 2016-08-29 LAB — GC/CHLAMYDIA PROBE AMP (~~LOC~~) NOT AT ARMC
Chlamydia: NEGATIVE
Neisseria Gonorrhea: NEGATIVE

## 2016-08-30 ENCOUNTER — Ambulatory Visit (HOSPITAL_COMMUNITY): Payer: Medicaid Other

## 2016-08-30 ENCOUNTER — Telehealth (HOSPITAL_COMMUNITY): Payer: Self-pay | Admitting: *Deleted

## 2016-08-30 NOTE — Telephone Encounter (Signed)
Preadmission screen  

## 2016-08-31 ENCOUNTER — Encounter (HOSPITAL_COMMUNITY): Payer: Self-pay

## 2016-08-31 ENCOUNTER — Inpatient Hospital Stay (HOSPITAL_COMMUNITY)
Admission: RE | Admit: 2016-08-31 | Discharge: 2016-09-04 | DRG: 765 | Disposition: A | Discharge status: Home / Self Care | Payer: Medicaid Other | Source: Ambulatory Visit | Attending: Family Medicine | Admitting: Family Medicine

## 2016-08-31 DIAGNOSIS — K219 Gastro-esophageal reflux disease without esophagitis: Secondary | ICD-10-CM | POA: Diagnosis present

## 2016-08-31 DIAGNOSIS — O99824 Streptococcus B carrier state complicating childbirth: Secondary | ICD-10-CM | POA: Diagnosis present

## 2016-08-31 DIAGNOSIS — O1002 Pre-existing essential hypertension complicating childbirth: Secondary | ICD-10-CM | POA: Diagnosis present

## 2016-08-31 DIAGNOSIS — O9962 Diseases of the digestive system complicating childbirth: Secondary | ICD-10-CM | POA: Diagnosis present

## 2016-08-31 DIAGNOSIS — O1092 Unspecified pre-existing hypertension complicating childbirth: Secondary | ICD-10-CM | POA: Diagnosis not present

## 2016-08-31 DIAGNOSIS — O99214 Obesity complicating childbirth: Secondary | ICD-10-CM | POA: Diagnosis present

## 2016-08-31 DIAGNOSIS — O34211 Maternal care for low transverse scar from previous cesarean delivery: Secondary | ICD-10-CM | POA: Diagnosis present

## 2016-08-31 DIAGNOSIS — Z87891 Personal history of nicotine dependence: Secondary | ICD-10-CM

## 2016-08-31 DIAGNOSIS — O10919 Unspecified pre-existing hypertension complicating pregnancy, unspecified trimester: Secondary | ICD-10-CM | POA: Diagnosis present

## 2016-08-31 DIAGNOSIS — O139 Gestational [pregnancy-induced] hypertension without significant proteinuria, unspecified trimester: Secondary | ICD-10-CM | POA: Diagnosis present

## 2016-08-31 DIAGNOSIS — Z6841 Body Mass Index (BMI) 40.0 and over, adult: Secondary | ICD-10-CM

## 2016-08-31 DIAGNOSIS — O10913 Unspecified pre-existing hypertension complicating pregnancy, third trimester: Secondary | ICD-10-CM | POA: Diagnosis not present

## 2016-08-31 DIAGNOSIS — O0990 Supervision of high risk pregnancy, unspecified, unspecified trimester: Secondary | ICD-10-CM

## 2016-08-31 DIAGNOSIS — Z3A39 39 weeks gestation of pregnancy: Secondary | ICD-10-CM

## 2016-08-31 DIAGNOSIS — O99820 Streptococcus B carrier state complicating pregnancy: Secondary | ICD-10-CM

## 2016-08-31 HISTORY — DX: Major depressive disorder, single episode, unspecified: F32.9

## 2016-08-31 HISTORY — DX: Anemia complicating pregnancy, unspecified trimester: O99.019

## 2016-08-31 HISTORY — DX: Gastro-esophageal reflux disease without esophagitis: K21.9

## 2016-08-31 LAB — CBC
HCT: 34.7 % — ABNORMAL LOW (ref 36.0–46.0)
Hemoglobin: 11.9 g/dL — ABNORMAL LOW (ref 12.0–15.0)
MCH: 28.5 pg (ref 26.0–34.0)
MCHC: 34.3 g/dL (ref 30.0–36.0)
MCV: 83.2 fL (ref 78.0–100.0)
PLATELETS: 406 10*3/uL — AB (ref 150–400)
RBC: 4.17 MIL/uL (ref 3.87–5.11)
RDW: 14.2 % (ref 11.5–15.5)
WBC: 13 10*3/uL — AB (ref 4.0–10.5)

## 2016-08-31 LAB — TYPE AND SCREEN
ABO/RH(D): A POS
Antibody Screen: NEGATIVE

## 2016-08-31 LAB — RAPID URINE DRUG SCREEN, HOSP PERFORMED
Amphetamines: NOT DETECTED
Barbiturates: NOT DETECTED
Benzodiazepines: NOT DETECTED
Cocaine: NOT DETECTED
OPIATES: NOT DETECTED
TETRAHYDROCANNABINOL: POSITIVE — AB

## 2016-08-31 LAB — GROUP B STREP BY PCR: GROUP B STREP BY PCR: NEGATIVE

## 2016-08-31 LAB — OB RESULTS CONSOLE GBS: STREP GROUP B AG: NEGATIVE

## 2016-08-31 MED ORDER — LIDOCAINE HCL (PF) 1 % IJ SOLN: 30.0000 mL | INTRAMUSCULAR | Status: DC | PRN

## 2016-08-31 MED ORDER — SOD CITRATE-CITRIC ACID 500-334 MG/5ML PO SOLN
30.0000 mL | ORAL | Status: DC | PRN
  Administered 2016-09-02: 30 mL via ORAL
  Filled 2016-08-31: qty 15

## 2016-08-31 MED ORDER — FENTANYL CITRATE (PF) 100 MCG/2ML IJ SOLN
50.0000 ug | INTRAMUSCULAR | Status: DC | PRN
  Administered 2016-08-31 – 2016-09-01 (×3): 50 ug via INTRAVENOUS
  Filled 2016-08-31 (×2): qty 2

## 2016-08-31 MED ORDER — ACETAMINOPHEN 325 MG PO TABS: 650.0000 mg | ORAL_TABLET | ORAL | Status: DC | PRN

## 2016-08-31 MED ORDER — OXYTOCIN BOLUS FROM INFUSION: 500.0000 mL | Freq: Once | INTRAVENOUS | Status: DC

## 2016-08-31 MED ORDER — LACTATED RINGERS IV SOLN
INTRAVENOUS | Status: DC
  Administered 2016-08-31 – 2016-09-01 (×3): via INTRAVENOUS

## 2016-08-31 MED ORDER — LACTATED RINGERS IV SOLN: 500.0000 mL | INTRAVENOUS | Status: DC | PRN

## 2016-08-31 MED ORDER — OXYTOCIN 40 UNITS IN LACTATED RINGERS INFUSION - SIMPLE MED
2.5000 [IU]/h | INTRAVENOUS | Status: DC
  Filled 2016-08-31: qty 1000

## 2016-08-31 MED ORDER — HYDROXYZINE HCL 50 MG PO TABS: 50.0000 mg | ORAL_TABLET | Freq: Four times a day (QID) | ORAL | Status: DC | PRN

## 2016-08-31 MED ORDER — ONDANSETRON HCL 4 MG/2ML IJ SOLN
4.0000 mg | Freq: Four times a day (QID) | INTRAMUSCULAR | Status: DC | PRN
  Administered 2016-09-02: 4 mg via INTRAVENOUS
  Filled 2016-08-31: qty 2

## 2016-08-31 NOTE — Anesthesia Pain Management Evaluation Note (Signed)
  CRNA Pain Management Visit Note  Patient: Terri Tran, 26 y.o., female  "Hello I am a member of the anesthesia team at [facility]. We have an anesthesia team available at all times to provide care throughout the hospital, including epidural management and anesthesia for C-section. I don't know your plan for the delivery whether it a natural birth, water birth, IV sedation, nitrous supplementation, doula or epidural, but we want to meet your pain goals."   1.Was your pain managed to your expectations on prior hospitalizations?   No --planned previous C-section at an OSH.  Attempted SAB x3.  SAB worked for patient, but pt did not like having to be stuck 3 times.  2.What is your expectation for pain management during this hospitalization?     Epidural  3.How can we help you reach that goal? Epidural when ready.  Record the patient's initial score and the patient's pain goal.   Pain: 7  Pain Goal: 8 [facility] wants you to be able to say your pain was always managed very well.  [name] 08/31/2016

## 2016-08-31 NOTE — H&P (Signed)
OBSTETRIC ADMISSION HISTORY AND PHYSICAL  Terri Tran is a 26 y.o. female G2P1001 with IUP at [redacted]w[redacted]d by 6 week [place] presenting for IOL, TOLAC. IOL for cHTN. She reports +FMs, No LOF, no VB, no blurry vision, headaches or peripheral edema, and RUQ pain.  She plans on bottle feeding. She request IUD for birth control.  Dating: By 6 week [place] --->  Estimated Date of Delivery: 09/07/16  Prenatal History/Complications:  Past Medical History: Past Medical History:  Diagnosis Date  . Anemia affecting first pregnancy 2013   was taking Iron supplements  . Depression 2013   postpartum depression after first delivery; was prescribed Xanax  . GERD (gastroesophageal reflux disease)    during pregnancy only; takes Tums; helps  . Hypertension     Past Surgical History: Past Surgical History:  Procedure Laterality Date  . CESAREAN SECTION      Obstetrical History: OB History    Gravida Para Term Preterm AB Living   0 0 1   SAB TAB Ectopic Multiple Live Births     0 0 0 1      Social History: Social History   Social History  . Marital status: Single    Spouse name: N/A  . Number of children: N/A  . Years of education: N/A   Social History Main Topics  . Smoking status: Former Smoker    Packs/day: 0.50    Types: Cigarettes  . Smokeless tobacco: Current User  . Alcohol use No  . Drug use: No  . Sexual activity: Yes    Birth control/ protection: None   Other Topics Concern  . None   Social History Narrative  . None    Family History: Family History  Problem Relation Age of Onset  . Hypertension Mother     Allergies: No Known Allergies  Prescriptions Prior to Admission  Medication Sig Dispense Refill Last Dose  . Acetaminophen (PAIN RELIEF 8 HOUR PO) Take 1 tablet by mouth every 8 (eight) hours as needed (pain).   Taking  . aspirin 81 MG tablet Take 1 tablet (81 mg total) by mouth daily. (Patient not taking: Reported on 08/28/2016) 30 tablet 6 Not Taking  .  labetalol (NORMODYNE) 100 MG tablet Take 1 tablet (100 mg total) by mouth 2 (two) times daily. 60 tablet 6 Taking  . Prenatal Vit-Fe Fumarate-FA (PRENATAL VITAMINS) 28-0.8 MG TABS Take 1 tablet by mouth daily. 30 tablet 6 Taking     Review of Systems   All systems reviewed and negative except as stated in HPI  Blood pressure 129/71, pulse 82, temperature 97.5 F (36.4 C), temperature source Oral, resp. rate 20, height  (1.727 m), weight (!) 152.3 kg (335 lb 12.8 oz), last menstrual period 12/06/2015. General appearance: alert, cooperative, no distress and morbidly obese Lungs: clear to auscultation bilaterally Heart: regular rate and rhythm Abdomen: soft, non-tender; bowel sounds normal Extremities: Homans sign is negative, no sign of DVT Presentation: cephalic Fetal monitoringBaseline: 120 bpm Uterine activityNone Dilation: Fingertip Effacement (%):  (unable to reach full length of cervix) Station: -3 Exam by:: Enis Slipper, RN   Prenatal labs: ABO, Rh: A/POS/-- (09/14 0001) Antibody: NEG (09/14 0001) Rubella: 1.02 RPR: NON REAC (01/26 1013)  HBsAg: NEGATIVE (09/14 0001)  HIV: NONREACTIVE (01/26 1013)  GBS:   pending 2 hr Glucola: negative Genetic screening  AFP normal Anatomy US normal  Prenatal Transfer Tool  Maternal Diabetes: No Genetic Screening: Normal Maternal Ultrasounds/Referrals: Normal Fetal Ultrasounds or  other Referrals:  None Maternal Substance Abuse:  No Significant Maternal Medications:  None Significant Maternal Lab Results: None  No results found for this or any previous visit (from the past 24 hour(s)).  Patient Active Problem List   Diagnosis Date Noted  . Chronic hypertension affecting pregnancy 08/31/2016  . Insufficient prenatal care 08/28/2016  . Obesity in pregnancy 08/28/2016  . BMI 50.0-59.9, adult (HCC) 08/28/2016  . Supervision of high risk pregnancy, antepartum 02/02/2016  . Chronic hypertension in pregnancy 02/02/2016  .  Previous cesarean section complicating pregnancy 02/02/2016    Assessment: Terri Tran is a 26 y.o. G2P1001 at [redacted]w[redacted]d here for IOL for cHTN and TOLAC  #Labor: foley bulb to be placed, no cytotec 2/2 TOLAC #Pain: Epidural >3cm and upon maternal request, prn fentanyl #FWB: Category 1 tractin #ID:  GBS not resulted from 4/10, will get rapid GBS #MOF: bottle #MOC: IUD #Circ:  desires  Durenda Hurt, MD 08/31/2016, 9:31 AM   CNM attestation:  I have seen and examined this patient; I agree with above documentation in the resident's note.   Terri Tran is a 26 y.o. G2P1001 here for IOL due to cHTN; pt is a TOLAC (has not labored); she is also morbidly obese  PE: BP (!) 115/46   Pulse 71   Temp 98.7 F (37.1 C) (Oral)   Resp 20   Ht  (1.727 m)   Wt (!) 152.3 kg (335 lb 12.8 oz)   LMP 12/06/2015 (Within Weeks)   BMI 51.06 kg/m  Gen: calm comfortable, NAD Resp: normal effort, no distress Abd: gravid  ROS, labs, PMH reviewed  Plan: Admit to [facility] [facility] FB due to prev C/S and unfavorable cx Will progress to [place] after it comes out  Cam Hai CNM 08/31/2016, 7:26 PM

## 2016-08-31 NOTE — Progress Notes (Signed)
Labor Progress Note Terri Tran is a 26 y.o. G2P1001 at [redacted]w[redacted]d presented for IOL for cHTN, TOLAC S: sleeping comfortably. No contraction on toco. FB still in place.   O:  BP (!) 115/46   Pulse 71   Temp 98.7 F (37.1 C) (Oral)   Resp 20   Ht  (1.727 m)   Wt (!) 335 lb 12.8 oz (152.3 kg)   LMP 12/06/2015 (Within Weeks)   BMI 51.06 kg/m  EFM: 120/mod var/+ accels/no decels  CVE: Dilation: Fingertip Effacement (%): Thick Cervical Position: Posterior Station: -3 Presentation: Vertex Exam by:: Dr. Jolayne Panther   A&P: 26 y.o. G2P1001 [redacted]w[redacted]d IOL for cHTN. BMI 50.  #cHTN: BP within normal limit. #Labor: FB in place for 9 hours now. No contraction on toco. She is sleepy. No Cytotec due to TOLAC. #Pain: not in pain. Sleeping comfortably #FWB: CAT-1 #GBS: neg  Almon Hercules, MD 9:45 PM

## 2016-08-31 NOTE — Progress Notes (Signed)
Patient ID: Terri Tran, female   DOB: 04-14-1991, 26 y.o.   MRN: [id number]  Exam to check for foley bulb status: Vaginal balloon not inflated; cervical balloon palp through cx, approx 2/90. BPs 115/46, other VSS FHR 130s, +accels, no decels Irreg ctx  Plan Pit for when balloon comes out  Cam Hai CNM 08/31/2016 11:39 PM

## 2016-08-31 NOTE — Progress Notes (Signed)
26 yo G2P1 with previous C-section here for TOLAC.at 39 weeks secondary to [facility]. Patient is without complaints.   Past Medical History:  Diagnosis Date  . Anemia affecting first pregnancy 2013   was taking Iron supplements  . Depression 2013   postpartum depression after first delivery; was prescribed Xanax  . GERD (gastroesophageal reflux disease)    during pregnancy only; takes Tums; helps  . Hypertension    Past Surgical History:  Procedure Laterality Date  . CESAREAN SECTION     Family History  Problem Relation Age of Onset  . Hypertension Mother    Social History  Substance Use Topics  . Smoking status: Former Smoker    Packs/day: 0.50    Types: Cigarettes  . Smokeless tobacco: Current User  . Alcohol use No   Blood pressure 129/71, pulse 82, temperature 97.5 F (36.4 C), temperature source Oral, resp. rate 20, height  (1.727 m), weight (!) 335 lb 12.8 oz (152.3 kg), last menstrual period 12/06/2015. GENERAL: Well-developed, well-nourished female in no acute distress.  ABDOMEN: Soft, nontender, gravid PELVIC: cervix is FT/long. EXTREMITIES: No cyanosis, clubbing, or edema, 2+ distal pulses.  FHT: baseline 140, mod variability, +accels, no decels Toco: no contractions  A/P 26 yo here for TOLAC at 39 weeks with [facility] - induction of labor started with placement of a cervical foley- Using a sterile speculum, a cook catheter was inserted without difficulty. Both balloons were inflated to 60 cc. - Patient tolerated the procedure well - continue close monitoring

## 2016-08-31 NOTE — Progress Notes (Signed)
Labor Progress Note  S: patient feeling contraction about every 10 min  O:  BP (!) 115/46   Pulse 71   Temp 98.7 F (37.1 C) (Oral)   Resp 20   Ht  (1.727 m)   Wt (!) 152.3 kg (335 lb 12.8 oz)   LMP 12/06/2015 (Within Weeks)   BMI 51.06 kg/m  EFM: 140, mod var, no decels [id number]: Fingertip Effacement (%): Thick Cervical Position: Posterior Station: -3 Presentation: Vertex Exam by:: Dr. Jolayne Panther   A&P: 26 y.o. G2P1001 [redacted]w[redacted]d IOL for cHTN. BMI 50s #Labor:foley bulb in place, no cytotec as TOLAC #FWB: Category 1 tracing #GBS: negative # gHTN; highest BP 142/62  Durenda Hurt, MD Resident Physician 5:11 PM

## 2016-09-01 ENCOUNTER — Inpatient Hospital Stay (HOSPITAL_COMMUNITY): Payer: Medicaid Other | Admitting: Anesthesiology

## 2016-09-01 LAB — CBC
HEMATOCRIT: 34.3 % — AB (ref 36.0–46.0)
Hemoglobin: 11.8 g/dL — ABNORMAL LOW (ref 12.0–15.0)
MCH: 28.4 pg (ref 26.0–34.0)
MCHC: 34.4 g/dL (ref 30.0–36.0)
MCV: 82.7 fL (ref 78.0–100.0)
PLATELETS: 380 10*3/uL (ref 150–400)
RBC: 4.15 MIL/uL (ref 3.87–5.11)
RDW: 14 % (ref 11.5–15.5)
WBC: 12 10*3/uL — ABNORMAL HIGH (ref 4.0–10.5)

## 2016-09-01 LAB — CULTURE, BETA STREP (GROUP B ONLY): Strep Gp B Culture: POSITIVE — AB

## 2016-09-01 LAB — RPR: RPR Ser Ql: NONREACTIVE

## 2016-09-01 LAB — ABO/RH: ABO/RH(D): A POS

## 2016-09-01 MED ORDER — FENTANYL CITRATE (PF) 100 MCG/2ML IJ SOLN
100.0000 ug | INTRAMUSCULAR | Status: AC | PRN
  Administered 2016-09-01: 100 ug via INTRAVENOUS
  Filled 2016-09-01: qty 2

## 2016-09-01 MED ORDER — LABETALOL HCL 200 MG PO TABS
200.0000 mg | ORAL_TABLET | Freq: Two times a day (BID) | ORAL | Status: DC
  Administered 2016-09-01 (×2): 200 mg via ORAL
  Filled 2016-09-01 (×2): qty 1

## 2016-09-01 MED ORDER — PHENYLEPHRINE 40 MCG/ML (10ML) SYRINGE FOR IV PUSH (FOR BLOOD PRESSURE SUPPORT)
PREFILLED_SYRINGE | INTRAVENOUS | Status: AC
  Filled 2016-09-01: qty 20

## 2016-09-01 MED ORDER — OXYTOCIN 40 UNITS IN LACTATED RINGERS INFUSION - SIMPLE MED
1.0000 m[IU]/min | INTRAVENOUS | Status: DC
  Administered 2016-09-01: 2 m[IU]/min via INTRAVENOUS
  Administered 2016-09-02: 18 m[IU]/min via INTRAVENOUS
  Administered 2016-09-02: 16 m[IU]/min via INTRAVENOUS
  Administered 2016-09-02: 24 m[IU]/min via INTRAVENOUS
  Administered 2016-09-02: 20 m[IU]/min via INTRAVENOUS

## 2016-09-01 MED ORDER — PHENYLEPHRINE 40 MCG/ML (10ML) SYRINGE FOR IV PUSH (FOR BLOOD PRESSURE SUPPORT)
PREFILLED_SYRINGE | INTRAVENOUS | Status: AC
  Filled 2016-09-01: qty 10

## 2016-09-01 MED ORDER — TERBUTALINE SULFATE 1 MG/ML IJ SOLN
0.2500 mg | Freq: Once | INTRAMUSCULAR | Status: DC | PRN
  Filled 2016-09-01 (×2): qty 1

## 2016-09-01 MED ORDER — FENTANYL 2.5 MCG/ML BUPIVACAINE 1/10 % EPIDURAL INFUSION (WH - ANES)
INTRAMUSCULAR | Status: AC
  Filled 2016-09-01: qty 100

## 2016-09-01 MED ORDER — LIDOCAINE-EPINEPHRINE (PF) 2 %-1:200000 IJ SOLN
INTRAMUSCULAR | Status: DC | PRN
  Administered 2016-09-01 (×2): 5 mL via EPIDURAL

## 2016-09-01 MED ORDER — PENICILLIN G POT IN DEXTROSE 60000 UNIT/ML IV SOLN
3.0000 10*6.[IU] | INTRAVENOUS | Status: DC
  Administered 2016-09-01 – 2016-09-02 (×3): 3 10*6.[IU] via INTRAVENOUS
  Filled 2016-09-01 (×4): qty 50

## 2016-09-01 MED ORDER — PENICILLIN G POTASSIUM 5000000 UNITS IJ SOLR
5.0000 10*6.[IU] | Freq: Once | INTRAVENOUS | Status: AC
  Administered 2016-09-01: 5 10*6.[IU] via INTRAVENOUS
  Filled 2016-09-01: qty 5

## 2016-09-01 NOTE — Anesthesia Preprocedure Evaluation (Signed)
Anesthesia Evaluation  Patient identified by MRN, date of birth, ID band Patient awake    Reviewed: Allergy & Precautions, H&P , Patient's Chart, lab work & pertinent test results  History of Anesthesia Complications Negative for: history of anesthetic complications  Airway Mallampati: II  TM Distance: >3 FB Neck ROM: full    Dental no notable dental hx.    Pulmonary neg pulmonary ROS, former smoker,    Pulmonary exam normal        Cardiovascular On Medications (-) DVT Normal cardiovascular exam(-) dysrhythmias (-) pacemaker(-) Cardiac Defibrillator      Neuro/Psych Depression negative neurological ROS     GI/Hepatic negative GI ROS, Neg liver ROS, GERD  Medicated,  Endo/Other  neg diabetesMorbid obesity  Renal/GU   negative genitourinary   Musculoskeletal negative musculoskeletal ROS (+)   Abdominal   Peds  Hematology negative hematology ROS (+) anemia ,   Anesthesia Other Findings   Reproductive/Obstetrics (+) Pregnancy                             Anesthesia Physical Anesthesia Plan  ASA: II  Anesthesia Plan: Epidural   Post-op Pain Management:    Induction:   Airway Management Planned:   Additional Equipment:   Intra-op Plan:   Post-operative Plan:   Informed Consent: I have reviewed the patients History and Physical, chart, labs and discussed the procedure including the risks, benefits and alternatives for the proposed anesthesia with the patient or authorized representative who has indicated his/her understanding and acceptance.     Plan Discussed with:   Anesthesia Plan Comments:         Anesthesia Quick Evaluation

## 2016-09-01 NOTE — Anesthesia Procedure Notes (Signed)
Epidural Patient location during procedure: OB Start time: 09/01/2016 8:19 PM  Staffing Anesthesiologist: Odette Fraction Performed: anesthesiologist   Preanesthetic Checklist Completed: patient identified, site marked, surgical consent, pre-op evaluation, timeout performed, IV checked, risks and benefits discussed and monitors and equipment checked  Epidural Patient position: sitting Patient monitoring: heart rate, continuous pulse ox and blood pressure Approach: midline Location: L3-L4 Injection technique: LOR air and LOR saline  Needle:  Needle type: Tuohy  Needle gauge: 18 G Needle length: 15 cm Needle insertion depth: 12 cm Catheter type: closed end flexible Catheter size: 20 Guage Catheter at skin depth: 18 cm Test dose: 2% lidocaine with Epi 1:200 K  Assessment Sensory level: T9  Additional Notes I was present and participated in the regional anesthetic procedure key events.  By signing, I attest that I have identified and re-evaluated the patient immediately before the induction of anesthesia and I am satisfied that my anesthetic plan is suitable for the patient's condition and procedure.Reason for block:at surgeon's request and procedure for pain

## 2016-09-01 NOTE — Progress Notes (Signed)
Patient ID: Terri Tran, female   DOB: Sep 11, 1990, 26 y.o.   MRN: [id number]  Pt sleeping; not awakened; foley still in place x 15h BP 113/51, other VSS FHR 125-130s, +accels, no decels Irreg ctx Cx deferred at present, but FB taped with traction  IUP@39 .1wks cHTN TOLAC Cx unfavorable  Will begin Pit and hold at 5mu/min  Cam Hai CNM 09/01/2016 2:53 AM

## 2016-09-01 NOTE — Progress Notes (Signed)
Labor Progress Note KEYANAH KOZICKI is a 26 y.o. G2P1001 at [redacted]w[redacted]d presented for IOL for cHTN, TOLAC S: FB still in place. No complaints  O:  BP (!) 110/56   Pulse 73   Temp 98.3 F (36.8 C) (Oral)   Resp 18   Ht  (1.727 m)   Wt (!) 335 lb 12.8 oz (152.3 kg)   LMP 12/06/2015 (Within Weeks)   BMI 51.06 kg/m  EFM: 140/mod var/+ accels/no decels  CVE: Dilation: 2 Effacement (%): 90 Cervical Position: Posterior Station: -3 Presentation: Vertex Exam by:: Clelia Croft, CNM   A&P: 26 y.o. G2P1001 [redacted]w[redacted]d IOL for cHTN. BMI 50.  #cHTN: BP within normal limit. #Labor: FB still in place #Pain: mild. PRN meds #FWB: CAT-1 #GBS: neg  Almon Hercules, MD 7:09 AM

## 2016-09-02 ENCOUNTER — Encounter (HOSPITAL_COMMUNITY): Payer: Self-pay

## 2016-09-02 ENCOUNTER — Inpatient Hospital Stay (HOSPITAL_COMMUNITY): Payer: Medicaid Other | Admitting: Anesthesiology

## 2016-09-02 ENCOUNTER — Encounter (HOSPITAL_COMMUNITY)
Admission: RE | Disposition: A | Discharge status: Home / Self Care | Payer: Self-pay | Source: Ambulatory Visit | Attending: Family Medicine

## 2016-09-02 DIAGNOSIS — O1092 Unspecified pre-existing hypertension complicating childbirth: Secondary | ICD-10-CM

## 2016-09-02 DIAGNOSIS — O99820 Streptococcus B carrier state complicating pregnancy: Secondary | ICD-10-CM

## 2016-09-02 SURGERY — Surgical Case
Anesthesia: Epidural

## 2016-09-02 MED ORDER — SODIUM BICARBONATE 8.4 % IV SOLN
INTRAVENOUS | Status: AC
  Filled 2016-09-02: qty 50

## 2016-09-02 MED ORDER — DEXAMETHASONE SODIUM PHOSPHATE 10 MG/ML IJ SOLN
INTRAMUSCULAR | Status: DC | PRN
  Administered 2016-09-02: 4 mg via INTRAVENOUS

## 2016-09-02 MED ORDER — LACTATED RINGERS IV SOLN
INTRAVENOUS | Status: DC | PRN
  Administered 2016-09-02 (×2): via INTRAVENOUS

## 2016-09-02 MED ORDER — ZOLPIDEM TARTRATE 5 MG PO TABS: 5.0000 mg | ORAL_TABLET | Freq: Every evening | ORAL | Status: DC | PRN

## 2016-09-02 MED ORDER — ONDANSETRON HCL 4 MG/2ML IJ SOLN
INTRAMUSCULAR | Status: DC | PRN
Start: 2016-09-02 — End: 2016-09-02
  Administered 2016-09-02: 4 mg via INTRAVENOUS

## 2016-09-02 MED ORDER — DEXAMETHASONE SODIUM PHOSPHATE 10 MG/ML IJ SOLN
INTRAMUSCULAR | Status: AC
  Filled 2016-09-02: qty 1

## 2016-09-02 MED ORDER — DEXTROSE 5 % IV SOLN
INTRAVENOUS | Status: DC | PRN
  Administered 2016-09-02: 3 g via INTRAVENOUS

## 2016-09-02 MED ORDER — DIBUCAINE 1 % RE OINT: 1.0000 "application " | TOPICAL_OINTMENT | RECTAL | Status: DC | PRN

## 2016-09-02 MED ORDER — ONDANSETRON HCL 4 MG/2ML IJ SOLN
INTRAMUSCULAR | Status: AC
  Filled 2016-09-02: qty 2

## 2016-09-02 MED ORDER — NALBUPHINE HCL 10 MG/ML IJ SOLN: 5.0000 mg | INTRAMUSCULAR | Status: DC | PRN

## 2016-09-02 MED ORDER — SODIUM CHLORIDE 0.9 % IR SOLN
Status: DC | PRN
  Administered 2016-09-02: 1000 mL

## 2016-09-02 MED ORDER — MEASLES, MUMPS & RUBELLA VAC ~~LOC~~ INJ
0.5000 mL | INJECTION | Freq: Once | SUBCUTANEOUS | Status: DC
  Filled 2016-09-02: qty 0.5

## 2016-09-02 MED ORDER — AMLODIPINE BESYLATE 5 MG PO TABS
5.0000 mg | ORAL_TABLET | Freq: Every day | ORAL | Status: DC
  Filled 2016-09-02 (×2): qty 1

## 2016-09-02 MED ORDER — LACTATED RINGERS IV SOLN
INTRAVENOUS | Status: DC
  Administered 2016-09-02: 21:00:00 via INTRAVENOUS

## 2016-09-02 MED ORDER — IBUPROFEN 600 MG PO TABS
600.0000 mg | ORAL_TABLET | Freq: Four times a day (QID) | ORAL | Status: DC
  Administered 2016-09-02 – 2016-09-04 (×7): 600 mg via ORAL
  Filled 2016-09-02 (×7): qty 1

## 2016-09-02 MED ORDER — FENTANYL CITRATE (PF) 100 MCG/2ML IJ SOLN
25.0000 ug | INTRAMUSCULAR | Status: DC | PRN
Start: 2016-09-02 — End: 2016-09-02

## 2016-09-02 MED ORDER — OXYTOCIN 40 UNITS IN LACTATED RINGERS INFUSION - SIMPLE MED: 2.5000 [IU]/h | INTRAVENOUS | Status: AC

## 2016-09-02 MED ORDER — PHENYLEPHRINE 40 MCG/ML (10ML) SYRINGE FOR IV PUSH (FOR BLOOD PRESSURE SUPPORT): 80.0000 ug | PREFILLED_SYRINGE | INTRAVENOUS | Status: DC | PRN

## 2016-09-02 MED ORDER — MEPERIDINE HCL 25 MG/ML IJ SOLN: 6.2500 mg | INTRAMUSCULAR | Status: DC | PRN

## 2016-09-02 MED ORDER — EPHEDRINE 5 MG/ML INJ: 10.0000 mg | INTRAVENOUS | Status: DC | PRN

## 2016-09-02 MED ORDER — MENTHOL 3 MG MT LOZG: 1.0000 | LOZENGE | OROMUCOSAL | Status: DC | PRN

## 2016-09-02 MED ORDER — KETOROLAC TROMETHAMINE 30 MG/ML IJ SOLN
INTRAMUSCULAR | Status: AC
  Administered 2016-09-02: 30 mg
  Filled 2016-09-02: qty 1

## 2016-09-02 MED ORDER — ONDANSETRON HCL 4 MG/2ML IJ SOLN
4.0000 mg | Freq: Three times a day (TID) | INTRAMUSCULAR | Status: DC | PRN
Start: 2016-09-02 — End: 2016-09-04

## 2016-09-02 MED ORDER — SIMETHICONE 80 MG PO CHEW: 80.0000 mg | CHEWABLE_TABLET | ORAL | Status: DC | PRN

## 2016-09-02 MED ORDER — NALOXONE HCL 2 MG/2ML IJ SOSY
1.0000 ug/kg/h | PREFILLED_SYRINGE | INTRAVENOUS | Status: DC | PRN
  Filled 2016-09-02: qty 2

## 2016-09-02 MED ORDER — LACTATED RINGERS IV SOLN
INTRAVENOUS | Status: DC | PRN
  Administered 2016-09-02: 40 [IU] via INTRAVENOUS

## 2016-09-02 MED ORDER — COCONUT OIL OIL: 1.0000 "application " | TOPICAL_OIL | Status: DC | PRN

## 2016-09-02 MED ORDER — SIMETHICONE 80 MG PO CHEW
80.0000 mg | CHEWABLE_TABLET | ORAL | Status: DC
Start: 2016-09-03 — End: 2016-09-04
  Administered 2016-09-02 – 2016-09-04 (×2): 80 mg via ORAL
  Filled 2016-09-02 (×2): qty 1

## 2016-09-02 MED ORDER — NALBUPHINE HCL 10 MG/ML IJ SOLN: 5.0000 mg | Freq: Once | INTRAMUSCULAR | Status: DC | PRN

## 2016-09-02 MED ORDER — PRENATAL MULTIVITAMIN CH
1.0000 | ORAL_TABLET | Freq: Every day | ORAL | Status: DC
  Administered 2016-09-03: 1 via ORAL
  Filled 2016-09-02: qty 1

## 2016-09-02 MED ORDER — MORPHINE SULFATE (PF) 0.5 MG/ML IJ SOLN
INTRAMUSCULAR | Status: AC
  Filled 2016-09-02: qty 10

## 2016-09-02 MED ORDER — SODIUM BICARBONATE 8.4 % IV SOLN
INTRAVENOUS | Status: DC | PRN
  Administered 2016-09-02 (×2): 3 mL via EPIDURAL
  Administered 2016-09-02: 10 mL via EPIDURAL

## 2016-09-02 MED ORDER — DIPHENHYDRAMINE HCL 50 MG/ML IJ SOLN: 12.5000 mg | INTRAMUSCULAR | Status: DC | PRN

## 2016-09-02 MED ORDER — FENTANYL CITRATE (PF) 100 MCG/2ML IJ SOLN
INTRAMUSCULAR | Status: AC
  Filled 2016-09-02: qty 2

## 2016-09-02 MED ORDER — SCOPOLAMINE 1 MG/3DAYS TD PT72
MEDICATED_PATCH | TRANSDERMAL | Status: AC
  Filled 2016-09-02: qty 1

## 2016-09-02 MED ORDER — KETOROLAC TROMETHAMINE 30 MG/ML IJ SOLN: 30.0000 mg | Freq: Four times a day (QID) | INTRAMUSCULAR | Status: AC | PRN

## 2016-09-02 MED ORDER — FENTANYL 2.5 MCG/ML BUPIVACAINE 1/10 % EPIDURAL INFUSION (WH - ANES)
14.0000 mL/h | INTRAMUSCULAR | Status: DC | PRN
  Administered 2016-09-01 – 2016-09-02 (×2): 14 mL/h via EPIDURAL

## 2016-09-02 MED ORDER — DIPHENHYDRAMINE HCL 25 MG PO CAPS
25.0000 mg | ORAL_CAPSULE | Freq: Four times a day (QID) | ORAL | Status: DC | PRN
Start: 2016-09-02 — End: 2016-09-04
  Administered 2016-09-02: 25 mg via ORAL
  Filled 2016-09-02: qty 1

## 2016-09-02 MED ORDER — FENTANYL 2.5 MCG/ML BUPIVACAINE 1/10 % EPIDURAL INFUSION (WH - ANES)
INTRAMUSCULAR | Status: AC
  Filled 2016-09-02: qty 100

## 2016-09-02 MED ORDER — SCOPOLAMINE 1 MG/3DAYS TD PT72
1.0000 | MEDICATED_PATCH | Freq: Once | TRANSDERMAL | Status: DC
  Filled 2016-09-02: qty 1

## 2016-09-02 MED ORDER — NALOXONE HCL 0.4 MG/ML IJ SOLN
0.4000 mg | INTRAMUSCULAR | Status: DC | PRN
Start: 2016-09-02 — End: 2016-09-04

## 2016-09-02 MED ORDER — TETANUS-DIPHTH-ACELL PERTUSSIS 5-2.5-18.5 LF-MCG/0.5 IM SUSP: 0.5000 mL | Freq: Once | INTRAMUSCULAR | Status: DC

## 2016-09-02 MED ORDER — ACETAMINOPHEN 325 MG PO TABS
650.0000 mg | ORAL_TABLET | ORAL | Status: DC | PRN
  Administered 2016-09-02: 650 mg via ORAL
  Filled 2016-09-02: qty 2

## 2016-09-02 MED ORDER — LACTATED RINGERS IV SOLN
500.0000 mL | Freq: Once | INTRAVENOUS | Status: AC
  Administered 2016-09-02: 500 mL via INTRAVENOUS

## 2016-09-02 MED ORDER — SENNOSIDES-DOCUSATE SODIUM 8.6-50 MG PO TABS
2.0000 | ORAL_TABLET | ORAL | Status: DC
  Administered 2016-09-02 – 2016-09-04 (×2): 2 via ORAL
  Filled 2016-09-02 (×2): qty 2

## 2016-09-02 MED ORDER — LABETALOL HCL 200 MG PO TABS
200.0000 mg | ORAL_TABLET | Freq: Two times a day (BID) | ORAL | Status: DC
  Administered 2016-09-02: 200 mg via ORAL
  Filled 2016-09-02: qty 1

## 2016-09-02 MED ORDER — SCOPOLAMINE 1 MG/3DAYS TD PT72
MEDICATED_PATCH | TRANSDERMAL | Status: DC | PRN
  Administered 2016-09-02: 1 via TRANSDERMAL

## 2016-09-02 MED ORDER — OXYTOCIN 10 UNIT/ML IJ SOLN
INTRAMUSCULAR | Status: AC
  Filled 2016-09-02: qty 4

## 2016-09-02 MED ORDER — MORPHINE SULFATE (PF) 0.5 MG/ML IJ SOLN
INTRAMUSCULAR | Status: DC | PRN
  Administered 2016-09-02: 3 mg via EPIDURAL

## 2016-09-02 MED ORDER — WITCH HAZEL-GLYCERIN EX PADS: 1.0000 "application " | MEDICATED_PAD | CUTANEOUS | Status: DC | PRN

## 2016-09-02 MED ORDER — FENTANYL CITRATE (PF) 100 MCG/2ML IJ SOLN
INTRAMUSCULAR | Status: DC | PRN
  Administered 2016-09-02 (×2): 50 ug via INTRAVENOUS

## 2016-09-02 MED ORDER — MISOPROSTOL 200 MCG PO TABS
ORAL_TABLET | ORAL | Status: AC
  Filled 2016-09-02: qty 5

## 2016-09-02 MED ORDER — METOCLOPRAMIDE HCL 5 MG/ML IJ SOLN: 10.0000 mg | Freq: Once | INTRAMUSCULAR | Status: DC | PRN

## 2016-09-02 MED ORDER — SODIUM CHLORIDE 0.9% FLUSH: 3.0000 mL | INTRAVENOUS | Status: DC | PRN

## 2016-09-02 MED ORDER — LACTATED RINGERS IV SOLN: INTRAVENOUS | Status: DC

## 2016-09-02 MED ORDER — DIPHENHYDRAMINE HCL 25 MG PO CAPS: 25.0000 mg | ORAL_CAPSULE | ORAL | Status: DC | PRN

## 2016-09-02 MED ORDER — LIDOCAINE-EPINEPHRINE (PF) 2 %-1:200000 IJ SOLN
INTRAMUSCULAR | Status: AC
  Filled 2016-09-02: qty 20

## 2016-09-02 MED ORDER — LACTATED RINGERS IV SOLN
INTRAVENOUS | Status: DC | PRN
  Administered 2016-09-02: 11:00:00 via INTRAVENOUS

## 2016-09-02 SURGICAL SUPPLY — 32 items
BLADE SURG 10 STRL SS (BLADE) ×3 IMPLANT
CHLORAPREP W/TINT 26ML (MISCELLANEOUS) ×3 IMPLANT
CLAMP CORD UMBIL (MISCELLANEOUS) IMPLANT
CLOTH BEACON ORANGE TIMEOUT ST (SAFETY) ×3 IMPLANT
DRAIN JACKSON PRT FLT 7MM (DRAIN) IMPLANT
DRESSING DISP NPWT PICO 4X12 (MISCELLANEOUS) ×3 IMPLANT
DRSG OPSITE POSTOP 4X10 (GAUZE/BANDAGES/DRESSINGS) ×3 IMPLANT
ELECT REM PT RETURN 9FT ADLT (ELECTROSURGICAL) ×3
ELECTRODE REM PT RTRN 9FT ADLT (ELECTROSURGICAL) ×1 IMPLANT
EVACUATOR SILICONE 100CC (DRAIN) IMPLANT
EXTRACTOR VACUUM M CUP 4 TUBE (SUCTIONS) IMPLANT
EXTRACTOR VACUUM M CUP 4' TUBE (SUCTIONS)
GLOVE BIO SURGEON STRL SZ7 (GLOVE) ×3 IMPLANT
GLOVE BIOGEL PI IND STRL 7.0 (GLOVE) ×2 IMPLANT
GLOVE BIOGEL PI INDICATOR 7.0 (GLOVE) ×4
GOWN STRL REUS W/TWL LRG LVL3 (GOWN DISPOSABLE) ×6 IMPLANT
KIT ABG SYR 3ML LUER SLIP (SYRINGE) IMPLANT
NEEDLE HYPO 25X5/8 SAFETYGLIDE (NEEDLE) ×3 IMPLANT
NS IRRIG 1000ML POUR BTL (IV SOLUTION) ×3 IMPLANT
PACK C SECTION WH (CUSTOM PROCEDURE TRAY) ×3 IMPLANT
PAD OB MATERNITY 4.3X12.25 (PERSONAL CARE ITEMS) ×3 IMPLANT
PENCIL SMOKE EVAC W/HOLSTER (ELECTROSURGICAL) ×3 IMPLANT
RTRCTR C-SECT PINK 25CM LRG (MISCELLANEOUS) ×3 IMPLANT
SUT MNCRL 0 VIOLET CTX 36 (SUTURE) ×2 IMPLANT
SUT MONOCRYL 0 CTX 36 (SUTURE) ×4
SUT PLAIN 2 0 XLH (SUTURE) ×3 IMPLANT
SUT VIC AB 0 CTX 36 (SUTURE) ×10
SUT VIC AB 0 CTX36XBRD ANBCTRL (SUTURE) ×5 IMPLANT
SUT VIC AB 4-0 KS 27 (SUTURE) ×3 IMPLANT
SYR KIT LINE DRAW 1CC W/FILTR (LINER) ×3 IMPLANT
TOWEL OR 17X24 6PK STRL BLUE (TOWEL DISPOSABLE) ×3 IMPLANT
TRAY FOLEY BAG SILVER LF 14FR (SET/KITS/TRAYS/PACK) ×3 IMPLANT

## 2016-09-02 NOTE — Transfer of Care (Signed)
Immediate Anesthesia Transfer of Care Note  Patient: Terri Tran  Procedure(s) Performed: Procedure(s): CESAREAN SECTION (N/A)  Patient Location: PACU  Anesthesia Type:Epidural  Level of Consciousness: awake and alert   Airway & Oxygen Therapy: Patient Spontanous Breathing  Post-op Assessment: Report given to RN and Post -op Vital signs reviewed and stable  Post vital signs: Reviewed  Last Vitals:  Vitals:   09/02/16 0902 09/02/16 0932  BP: 130/63 134/80  Pulse: 67 79  Resp: 18 18  Temp:  36.3 C    Last Pain:  Vitals:   09/02/16 0932  TempSrc: Oral  PainSc:          Complications: No apparent anesthesia complications

## 2016-09-02 NOTE — Progress Notes (Signed)
Patient ID: Terri Tran, female   DOB: 1991/04/21, 26 y.o.   MRN: [id number]   Pitocin discontinued for the thrid time due to fetal intolerance to labor.  Unable to restart pitocin.  Discussed with patient option for c/s due to inability to maintain adequate contractions.  Pt opts for c/s.  The risks of cesarean section discussed with the patient included but were not limited to: bleeding which may require transfusion or reoperation; infection which may require antibiotics; injury to bowel, bladder, ureters or other surrounding organs; injury to the fetus; need for additional procedures including hysterectomy in the event of a life-threatening hemorrhage; placental abnormalities wth subsequent pregnancies, incisional problems, thromboembolic phenomenon and other postoperative/anesthesia complications. The patient concurred with the proposed plan, giving informed written consent for the procedure.

## 2016-09-02 NOTE — Brief Op Note (Signed)
08/31/2016 - 09/02/2016  11:23 AM  PATIENT:  [name]  26 y.o. female  PRE-OPERATIVE DIAGNOSIS:  26 yo G2P1 [redacted] weeks EGA with fetal intolerance to labor  POST-OPERATIVE DIAGNOSIS: 26 yo G2P1 [redacted] weeks EGA with fetal intolerance to labor  PROCEDURE:  Procedure(s): CESAREAN SECTION (N/A)  SURGEON:  Surgeon(s) and Role:    * Lesly Dukes, MD - Primary    * Hiram Comber Mumaw, DO - Fellow  ANESTHESIA:   epidural  EBL:  Total I/O In: 2200 [I.V.:2200] Out: 1700 [Urine:1000; Blood:700]  BLOOD ADMINISTERED:none  DRAINS: none   LOCAL MEDICATIONS USED:  NONE  SPECIMEN:  No Specimen  DISPOSITION OF SPECIMEN:  N/A  COUNTS:  YES  TOURNIQUET:  * No tourniquets in log *  DICTATION: .Note written in EPIC  PLAN OF CARE: Admit to inpatient   PATIENT DISPOSITION:  PACU - hemodynamically stable.   Delay start of Pharmacological VTE agent (>24hrs) due to surgical blood loss or risk of bleeding: not applicable

## 2016-09-02 NOTE — Anesthesia Preprocedure Evaluation (Signed)
Anesthesia Evaluation  Patient identified by MRN, date of birth, ID band Patient awake    Reviewed: Allergy & Precautions, H&P , Patient's Chart, lab work & pertinent test results  History of Anesthesia Complications Negative for: history of anesthetic complications  Airway Mallampati: II  TM Distance: >3 FB Neck ROM: full    Dental no notable dental hx.    Pulmonary neg pulmonary ROS, former smoker,    Pulmonary exam normal        Cardiovascular hypertension, On Medications (-) DVT Normal cardiovascular exam(-) dysrhythmias (-) pacemaker(-) Cardiac Defibrillator      Neuro/Psych Depression negative neurological ROS     GI/Hepatic negative GI ROS, Neg liver ROS, GERD  Medicated,  Endo/Other  neg diabetesMorbid obesity  Renal/GU   negative genitourinary   Musculoskeletal negative musculoskeletal ROS (+)   Abdominal   Peds  Hematology negative hematology ROS (+) anemia ,   Anesthesia Other Findings   Reproductive/Obstetrics (+) Pregnancy                             Anesthesia Physical  Anesthesia Plan  ASA: II  Anesthesia Plan: Epidural   Post-op Pain Management:    Induction:   Airway Management Planned:   Additional Equipment:   Intra-op Plan:   Post-operative Plan:   Informed Consent: I have reviewed the patients History and Physical, chart, labs and discussed the procedure including the risks, benefits and alternatives for the proposed anesthesia with the patient or authorized representative who has indicated his/her understanding and acceptance.     Plan Discussed with:   Anesthesia Plan Comments: (Existing labor epidural dosed for C/S)        Anesthesia Quick Evaluation

## 2016-09-02 NOTE — Progress Notes (Signed)
Patient ID: Terri Tran female   DOB: May 07, 1991, 26 y.o.   MRN: [id number]  S: Patient seen & examined for progress of labor. Patient comfortable with epidural.    O:  Vitals:   09/02/16 0002 09/02/16 0102 09/02/16 0136 09/02/16 0202  BP: 126/71 124/71 103/62 (!) 114/50  Pulse: 76 74 77 74  Resp:      Temp:      TempSrc:      Weight:      Height:        Dilation: 5 Effacement (%): 70 Cervical Position: Middle Station: -2 Presentation: Vertex Exam by:: Chevez Sambrano,MD   FHT: 135 bpm, mod var, +accels, no decels IUPC: Inadequate, 150, q2-2min   A/P: Continue increasing pitocin Continue expectant management Anticipate SVD

## 2016-09-02 NOTE — Addendum Note (Signed)
Addendum  created 09/02/16 1610 by Phillips Grout, MD   Anesthesia Attestations filed, Anesthesia Event deleted, Anesthesia Intra Meds edited, Anesthesia Staff edited, Delete clinical note, Sign clinical note

## 2016-09-02 NOTE — Anesthesia Postprocedure Evaluation (Deleted)
Anesthesia Post Note  Patient: Terri Tran  Procedure(s) Performed: * No procedures listed *         Last Vitals:  Vitals:   09/02/16 0302 09/02/16 0332  BP: 130/66 132/70  Pulse: 76 73  Resp:    Temp:      Last Pain:  Vitals:   09/01/16 1945  TempSrc: Oral  PainSc:    Pain Goal:                 Gwynne Edinger

## 2016-09-02 NOTE — Op Note (Signed)
PRE-OPERATIVE DIAGNOSIS:  26 yo G2P1 [redacted] weeks EGA with fetal intolerance to labor  POST-OPERATIVE DIAGNOSIS: 26 yo G2P1 [redacted] weeks EGA with fetal intolerance to labor  PROCEDURE:  Procedure(s): CESAREAN SECTION (N/A)  SURGEON:  Surgeon(s) and Role:    * Lesly Dukes, MD - Primary    * Hiram Comber Mumaw, DO - Fellow  ANESTHESIA:   epidural  EBL:  Total I/O In: 2200 [I.V.:2200] Out: 1700 [Urine:1000; Blood:700]  BLOOD ADMINISTERED:none  DRAINS: none   LOCAL MEDICATIONS USED:  NONE  SPECIMEN:  No Specimen  DISPOSITION OF SPECIMEN:  N/A  COUNTS:  YES  TOURNIQUET:  * No tourniquets in log *  DICTATION: .Note written in EPIC  PLAN OF CARE: Admit to inpatient   PATIENT DISPOSITION:  PACU - hemodynamically stable.   Delay start of Pharmacological VTE agent (>24hrs) due to surgical blood loss or risk of bleeding: not applicable  INDICATIONS: Terri Tran is a 26 y.o. G2P1001 at [redacted]w[redacted]d induced for [facility].  Fetal heart rate not tolerating pitocin or adequate contractions.  See separate for further information regarding indications.  The risks of cesarean section discussed with the patient included but were not limited to: bleeding which may require transfusion or reoperation; infection which may require antibiotics; injury to bowel, bladder, ureters or other surrounding organs; injury to the fetus; need for additional procedures including hysterectomy in the event of a life-threatening hemorrhage; placental abnormalities wth subsequent pregnancies, incisional problems, thromboembolic phenomenon and other postoperative/anesthesia complications. The patient concurred with the proposed plan, giving informed written consent for the procedure.    FINDINGS:  Viable female  infant in cephalic presentation, ROT, asynclitic Apgars 9,9, weight to be determined in 1 hour, copious clear amniotic fluid.  Intact placenta, three vessel cord.  Grossly normal uterus, ovaries and fallopian  tubes.  COMPLICATIONS: None immediate  PROCEDURE IN DETAIL:  The patient received intravenous antibiotics and had sequential compression devices applied to her lower extremities.  Epidural anesthesia was dosed up to surgical level and was found to be adequate. She was then placed in a dorsal supine position with a leftward tilt, and prepped and draped in a sterile manner.  A foley catheter was placed into her bladder and attached to constant gravity.  After an adequate timeout was performed, a Pfannenstiel skin incision was made with scalpel and carried through to the underlying layer of fascia. The fascia was incised in the midline and this incision was extended bilaterally using the Mayo scissors. Kocher clamps were applied to the superior aspect of the fascial incision and the underlying rectus muscles were dissected off bluntly. A similar process was carried out on the inferior aspect of the facial incision. The rectus muscles were separated in the midline bluntly and the peritoneum was entered bluntly.   A transverse hysterotomy was made with a scalpel and extended bilaterally bluntly. The bladder blade was then removed. The infant was successfully delivered, and cord was clamped and cut and infant was handed over to awaiting neonatology team. Uterine massage was then administered and the placenta delivered intact with three-vessel cord. The uterus was cleared of clot and debris.  The hysterotomy was closed with 0 monocryl.  A second imbricating suture of 0-monocryl was used to reinforce the incision and aid in hemostasis.  The peritoneum was re approximated with 0-Vicryl.   Rectus muscles were noted to be hemostatic.  The fascia was closed with 0-Vicryl in a running fashion with good restoration of anatomy.  The subcutaneus  tissue was copiously irrigated and closed with 0 plain.  The skin was closed with 4-0 Vicryl in a subcuticular fashion.  Pico wound vacuum placed.  Pt tolerated the procedure will.   All counts were correct x2.  Pt went to the recovery room in stable condition.

## 2016-09-03 LAB — CBC
HEMATOCRIT: 29.3 % — AB (ref 36.0–46.0)
HEMOGLOBIN: 9.9 g/dL — AB (ref 12.0–15.0)
MCH: 28 pg (ref 26.0–34.0)
MCHC: 33.8 g/dL (ref 30.0–36.0)
MCV: 83 fL (ref 78.0–100.0)
Platelets: 333 10*3/uL (ref 150–400)
RBC: 3.53 MIL/uL — AB (ref 3.87–5.11)
RDW: 14.2 % (ref 11.5–15.5)
WBC: 17.4 10*3/uL — ABNORMAL HIGH (ref 4.0–10.5)

## 2016-09-03 MED ORDER — OXYCODONE HCL 5 MG PO TABS
5.0000 mg | ORAL_TABLET | ORAL | Status: DC | PRN
  Administered 2016-09-03 – 2016-09-04 (×3): 5 mg via ORAL
  Filled 2016-09-03 (×3): qty 1

## 2016-09-03 MED ORDER — OXYCODONE-ACETAMINOPHEN 5-325 MG PO TABS
1.0000 | ORAL_TABLET | ORAL | Status: DC | PRN
  Administered 2016-09-03 – 2016-09-04 (×3): 2 via ORAL
  Filled 2016-09-03 (×3): qty 2

## 2016-09-03 MED ORDER — POLYSACCHARIDE IRON COMPLEX 150 MG PO CAPS
150.0000 mg | ORAL_CAPSULE | Freq: Two times a day (BID) | ORAL | Status: DC
  Administered 2016-09-03 – 2016-09-04 (×3): 150 mg via ORAL
  Filled 2016-09-03 (×3): qty 1

## 2016-09-03 MED ORDER — OXYCODONE-ACETAMINOPHEN 5-325 MG PO TABS
1.0000 | ORAL_TABLET | ORAL | Status: DC | PRN
  Administered 2016-09-03 (×3): 2 via ORAL
  Filled 2016-09-03: qty 1
  Filled 2016-09-03: qty 2
  Filled 2016-09-03: qty 1
  Filled 2016-09-03: qty 2

## 2016-09-03 MED ORDER — PNEUMOCOCCAL VAC POLYVALENT 25 MCG/0.5ML IJ INJ
0.5000 mL | INJECTION | INTRAMUSCULAR | Status: DC
  Filled 2016-09-03: qty 0.5
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
## 2016-09-03 NOTE — Anesthesia Postprocedure Evaluation (Signed)
Anesthesia Post Note  Patient: Terri Tran  Procedure(s) Performed: Procedure(s) (LRB): CESAREAN SECTION (N/A)  Patient location during evaluation: Mother Baby Anesthesia Type: Epidural Level of consciousness: awake and alert Pain management: pain level controlled Vital Signs Assessment: post-procedure vital signs reviewed and stable Respiratory status: spontaneous breathing Cardiovascular status: blood pressure returned to baseline Postop Assessment: no headache, no backache, epidural receding, adequate PO intake, no signs of nausea or vomiting and patient able to bend at knees Anesthetic complications: no        Last Vitals:  Vitals:   09/03/16 0001 09/03/16 0420  BP: 118/60 105/60  Pulse: 70 64  Resp: 17 17  Temp: 36.9 C 37 C    Last Pain:  Vitals:   09/03/16 0527  TempSrc:   PainSc: 2    Pain Goal:                 [facility]

## 2016-09-03 NOTE — Progress Notes (Addendum)
Patient ID: Terri Tran, female   DOB: 1990/10/01, 26 y.o.   MRN: [id number] Post Op Day 1 Subjective:  Terri Tran is a 26 y.o. [id number] [redacted]w[redacted]d s/p RLTCS for fetal intolerance of labor.  No acute events overnight.  Pt denies problems with ambulating or po intake. Foley catheter removed this AM, she has not voided yet since its removal. She denies nausea or vomiting.  Pain is well controlled.  She has not had flatus. She has not had bowel movement.  Lochia Small.  Plan for birth control is IUD.  Method of Feeding: breast/bottle  Objective: Blood pressure 105/60, pulse 64, temperature 98.6 F (37 C), temperature source Oral, resp. rate 17, height  (1.727 m), weight (!) 152.3 kg (335 lb 12.8 oz), last menstrual period 12/06/2015, SpO2 100 %, unknown if currently breastfeeding.  Physical Exam:  General: alert, cooperative and no distress Lochia:normal flow Chest: CTAB Heart: RRR no m/r/g Abdomen: +BS, soft, nontender,  Uterine Fundus: firm, at level of umbilicus DVT Evaluation: No evidence of DVT seen on physical exam. Extremities: no edema   Recent Labs  09/01/16 1942 09/03/16 0624  HGB 11.8* 9.9*  HCT 34.3* 29.3*    Assessment/Plan:  ASSESSMENT: Terri Tran is a 26 y.o. [id number] [redacted]w[redacted]d s/p RLTCS after failed TOLAC due to fetal intolerance of labor.   Plan for discharge tomorrow, Breastfeeding and Contraception IUD. Pain control. Early ambulation. Monitor for voids, stooling, and flatus.  gHTN-monitor for need for medication. Highest BP in last 24 h 147/97.   LOS: 3 days   Terri Tran 09/03/2016, 7:34 AM

## 2016-09-04 LAB — BIRTH TISSUE RECOVERY COLLECTION (PLACENTA DONATION)

## 2016-09-04 MED ORDER — OXYCODONE-ACETAMINOPHEN 5-325 MG PO TABS: 1.0000 | ORAL_TABLET | ORAL | 0 refills | Status: DC | PRN

## 2016-09-04 NOTE — Discharge Summary (Signed)
OB Discharge Summary  Patient Name: Terri Tran DOB: 05/15/1991 MRN: [id number]  Date of admission: 08/31/2016 Delivering MD: Elsie Lincoln H   Date of discharge: 09/04/2016  Admitting diagnosis: INDUCTION Intrauterine pregnancy: [redacted]w[redacted]d     Secondary diagnosis:Active Problems:   Chronic hypertension affecting pregnancy   GBS (group B Streptococcus carrier), +RV culture, currently pregnant  Additional problems: morbid obesity     Discharge diagnosis: Term Pregnancy Delivered                                                                      Complications: None  Hospital course:  Induction of Labor With Cesarean Section  26 y.o. yo G2P2002 at [redacted]w[redacted]d was admitted to the hospital 08/31/2016 for induction of labor. Patient had a labor course significant for fetal intolerance to labor. The patient went for cesarean section due to Non-Reassuring FHR and history of previous cesarean, and delivered a Viable infant,@BABYSUPPRESS (DBLINK,ept,110,,1,,) Membrane Rupture Time/Date: )10:53 PM ,09/01/2016    of operation can be found in separate operative Note.  Patient had an uncomplicated postpartum course. She is ambulating, tolerating a regular diet, passing flatus, and urinating well.  Patient is discharged home in stable condition on 09/04/16.                                    Physical exam  Vitals:   09/03/16 0810 09/03/16 1200 09/03/16 1900 09/04/16 0500  BP: 105/90 126/72 (!) 142/69 120/68  Pulse: 66 70 74 63  Resp: Temp: 98.4 F (36.9 C)  98.2 F (36.8 C) 98.2 F (36.8 C)  TempSrc:   Oral Oral  SpO2: 98%     Weight:      Height:       General: alert Lochia: appropriate Uterine Fundus: firm Incision: Dressing is clean, dry, and intact, Pico wound vac in place DVT Evaluation: No evidence of DVT seen on physical exam. Labs: Lab Results  Component Value Date   WBC 17.4 (H) 09/03/2016   HGB 9.9 (L) 09/03/2016   HCT 29.3 (L) 09/03/2016   MCV 83.0  09/03/2016   PLT 333 09/03/2016   CMP Latest Ref Rng & Units 02/02/2016  Glucose 65 - 99 mg/dL 74  BUN 7 - 25 mg/dL 8  Creatinine 4.09 - 8.11 mg/dL 9.14  Sodium 782 - 956 mmol/L 135  Potassium 3.5 - 5.3 mmol/L 4.4  Chloride 98 - 110 mmol/L 102  CO2 20 - 31 mmol/L 24  Calcium 8.6 - 10.2 mg/dL 9.5  Total Protein 6.1 - 8.1 g/dL 6.3  Total Bilirubin 0.2 - 1.2 mg/dL 0.3  Alkaline Phos 33 - 115 U/L 42  AST 10 - 30 U/L 15  ALT 6 - 29 U/L 15    Discharge instruction: per After Visit Summary and "Baby and Me Booklet".  After Visit Meds:  Allergies as of 09/04/2016   No Known Allergies     Medication List    STOP taking these medications   aspirin 81 MG tablet   ibuprofen 200 MG tablet Commonly known as:  ADVIL,MOTRIN     TAKE these medications   labetalol  100 MG tablet Commonly known as:  NORMODYNE Take 1 tablet (100 mg total) by mouth 2 (two) times daily.   oxyCODONE-acetaminophen 5-325 MG tablet Commonly known as:  PERCOCET/ROXICET Take 1-2 tablets by mouth every 4 (four) hours as needed for moderate pain or severe pain.   Prenatal Vitamins 28-0.8 MG Tabs Take 1 tablet by mouth daily.       Diet: routine diet  Activity: Advance as tolerated. Pelvic rest for 6 weeks.   Outpatient follow up:6 weeks, already scheduled in the clinic Follow up Appt:Future Appointments Date Time Provider Department Center  10/03/2016 10:20 AM Donette Larry, CNM [facility]   Follow up visit: No Follow-up on file.  Postpartum contraception: plans for Mirena at postpartum visit  Newborn Data: Live born female  Birth Weight: 6 lb 13.9 oz (3115 g) APGAR: 9, 9  Baby Feeding: Bottle Disposition:home with mother   09/04/2016 Allie Bossier, MD

## 2016-09-04 NOTE — Addendum Note (Signed)
Addendum  created 09/04/16 0720 by Phillips Grout, MD   Anesthesia Event edited, Anesthesia Staff edited

## 2016-09-04 NOTE — Discharge Instructions (Signed)

## 2016-10-03 ENCOUNTER — Ambulatory Visit: Payer: Medicaid Other | Admitting: Certified Nurse Midwife

## 2016-10-29 ENCOUNTER — Encounter: Payer: Self-pay | Admitting: Family Medicine

## 2016-10-29 ENCOUNTER — Ambulatory Visit: Payer: Medicaid Other | Admitting: Advanced Practice Midwife

## 2016-11-29 NOTE — Addendum Note (Signed)
Addendum  created 11/29/16 1426 by Takyra Cantrall, MD   Sign clinical note    

## 2016-11-29 NOTE — Anesthesia Postprocedure Evaluation (Signed)
Anesthesia Post Note  Patient: Terri Tran  Procedure(s) Performed: Procedure(s) (LRB): CESAREAN SECTION (N/A)     Anesthesia Post Evaluation  Last Vitals:  Vitals:   09/03/16 1900 09/04/16 0500  BP: (!) 142/69 120/68  Pulse: 74 63  Resp: 18 18  Temp: 36.8 C 36.8 C    Last Pain:  Vitals:   09/04/16 1136  TempSrc:   PainSc: 4                  Phillips Groutarignan, Garon Melander

## 2017-05-29 ENCOUNTER — Emergency Department (HOSPITAL_COMMUNITY)
Admission: EM | Admit: 2017-05-29 | Discharge: 2017-05-30 | Disposition: A | Discharge status: Home / Self Care | Payer: Medicaid Other | Attending: Emergency Medicine | Admitting: Emergency Medicine

## 2017-05-29 ENCOUNTER — Encounter (HOSPITAL_COMMUNITY): Payer: Self-pay | Admitting: Emergency Medicine

## 2017-05-29 DIAGNOSIS — F419 Anxiety disorder, unspecified: Secondary | ICD-10-CM | POA: Diagnosis not present

## 2017-05-29 DIAGNOSIS — Y69 Unspecified misadventure during surgical and medical care: Secondary | ICD-10-CM | POA: Insufficient documentation

## 2017-05-29 DIAGNOSIS — T50905A Adverse effect of unspecified drugs, medicaments and biological substances, initial encounter: Secondary | ICD-10-CM | POA: Diagnosis not present

## 2017-05-29 DIAGNOSIS — Z79899 Other long term (current) drug therapy: Secondary | ICD-10-CM | POA: Insufficient documentation

## 2017-05-29 DIAGNOSIS — T887XXA Unspecified adverse effect of drug or medicament, initial encounter: Secondary | ICD-10-CM | POA: Insufficient documentation

## 2017-05-29 DIAGNOSIS — I10 Essential (primary) hypertension: Secondary | ICD-10-CM | POA: Insufficient documentation

## 2017-05-29 DIAGNOSIS — Z87891 Personal history of nicotine dependence: Secondary | ICD-10-CM | POA: Insufficient documentation

## 2017-05-29 LAB — CBC WITH DIFFERENTIAL/PLATELET
Basophils Absolute: 0 10*3/uL (ref 0.0–0.1)
Basophils Relative: 1 %
EOS ABS: 0.1 10*3/uL (ref 0.0–0.7)
EOS PCT: 1 %
HCT: 43.2 % (ref 36.0–46.0)
Hemoglobin: 14.2 g/dL (ref 12.0–15.0)
LYMPHS PCT: 22 %
Lymphs Abs: 1.7 10*3/uL (ref 0.7–4.0)
MCH: 27.4 pg (ref 26.0–34.0)
MCHC: 32.9 g/dL (ref 30.0–36.0)
MCV: 83.2 fL (ref 78.0–100.0)
Monocytes Absolute: 0.8 10*3/uL (ref 0.1–1.0)
Monocytes Relative: 10 %
Neutro Abs: 5 10*3/uL (ref 1.7–7.7)
Neutrophils Relative %: 66 %
PLATELETS: 501 10*3/uL — AB (ref 150–400)
RBC: 5.19 MIL/uL — AB (ref 3.87–5.11)
RDW: 14 % (ref 11.5–15.5)
WBC: 7.6 10*3/uL (ref 4.0–10.5)

## 2017-05-29 LAB — BASIC METABOLIC PANEL
Anion gap: 9 (ref 5–15)
BUN: 8 mg/dL (ref 6–20)
CALCIUM: 8.8 mg/dL — AB (ref 8.9–10.3)
CO2: 21 mmol/L — ABNORMAL LOW (ref 22–32)
Chloride: 107 mmol/L (ref 101–111)
Creatinine, Ser: 0.76 mg/dL (ref 0.44–1.00)
Glucose, Bld: 81 mg/dL (ref 65–99)
POTASSIUM: 3.7 mmol/L (ref 3.5–5.1)
Sodium: 137 mmol/L (ref 135–145)

## 2017-05-29 LAB — RAPID URINE DRUG SCREEN, HOSP PERFORMED
AMPHETAMINES: NOT DETECTED
BARBITURATES: NOT DETECTED
Benzodiazepines: NOT DETECTED
Cocaine: POSITIVE — AB
OPIATES: NOT DETECTED
TETRAHYDROCANNABINOL: POSITIVE — AB

## 2017-05-29 LAB — URINALYSIS, ROUTINE W REFLEX MICROSCOPIC
BILIRUBIN URINE: NEGATIVE
Bacteria, UA: NONE SEEN
Glucose, UA: NEGATIVE mg/dL
HGB URINE DIPSTICK: NEGATIVE
Ketones, ur: 20 mg/dL — AB
LEUKOCYTES UA: NEGATIVE
Nitrite: NEGATIVE
PH: 5 (ref 5.0–8.0)
Protein, ur: 30 mg/dL — AB
SPECIFIC GRAVITY, URINE: 1.029 (ref 1.005–1.030)

## 2017-05-29 LAB — ETHANOL

## 2017-05-29 LAB — HCG, QUANTITATIVE, PREGNANCY: hCG, Beta Chain, Quant, S: <1 m[IU]/mL (ref <5)

## 2017-05-29 NOTE — ED Triage Notes (Signed)
Patient reports anxiety attack today with mild SOB , pt. stated multiple emotional stressors this week , denies suicidal ideations / no hallucinations . Respirations unlabored at triage .

## 2017-05-30 ENCOUNTER — Encounter (HOSPITAL_COMMUNITY): Payer: Self-pay | Admitting: Emergency Medicine
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
## 2017-05-30 NOTE — ED Provider Notes (Signed)
[facility] EMERGENCY DEPARTMENT Provider Note   CSN: [id number] Arrival date & time: 05/29/17  1814     History   Chief Complaint Chief Complaint  Patient presents with  . Anxiety    HPI Terri Tran is a 27 y.o. female.   Anxiety  This is a new problem. The current episode started 12 to 24 hours ago. The problem occurs constantly. The problem has been rapidly improving. Pertinent negatives include no chest pain, no abdominal pain, no headaches and no shortness of breath. Nothing aggravates the symptoms. Nothing relieves the symptoms. She has tried nothing for the symptoms. The treatment provided no relief.  No SI or HI no AH or VH, drank from a cup at a friends house last night and has felt anxious since.    Past Medical History:  Diagnosis Date  . Anemia affecting first pregnancy 2013   was taking Iron supplements  . Depression 2013   postpartum depression after first delivery; was prescribed Xanax  . GERD (gastroesophageal reflux disease)    during pregnancy only; takes Tums; helps  . Hypertension     Patient Active Problem List   Diagnosis Date Noted  . GBS (group B Streptococcus carrier), +RV culture, currently pregnant 09/02/2016  . Chronic hypertension affecting pregnancy 08/31/2016  . Insufficient prenatal care 08/28/2016  . Obesity in pregnancy 08/28/2016  . BMI 50.0-59.9, adult (HCC) 08/28/2016  . Supervision of high risk pregnancy, antepartum 02/02/2016  . Chronic hypertension in pregnancy 02/02/2016  . Previous cesarean section complicating pregnancy 02/02/2016    Past Surgical History:  Procedure Laterality Date  . CESAREAN SECTION    . CESAREAN SECTION N/A 09/02/2016   Procedure: CESAREAN SECTION;  Surgeon: Lesly Dukes, MD;  Location: [facility] SU[facility];  Service: Obstetrics;  Laterality: N/A;    OB History    Gravida Para Term Preterm AB Living   2 2 2  0 0 2   SAB TAB Ectopic Multiple Live Births   0 0 0 0 2        Home Medications    Prior to Admission medications   Medication Sig Start Date End Date Taking? Authorizing Provider  labetalol (NORMODYNE) 100 MG tablet Take 1 tablet (100 mg total) by mouth 2 (two) times daily. 04/24/16   Lorne Skeens, MD  oxyCODONE-acetaminophen (PERCOCET/ROXICET) 5-325 MG tablet Take 1-2 tablets by mouth every 4 (four) hours as needed for moderate pain or severe pain. 09/04/16   Allie Bossier, MD  Prenatal Vit-Fe Fumarate-FA (PRENATAL VITAMINS) 28-0.8 MG TABS Take 1 tablet by mouth daily. 04/02/16   Lorne Skeens, MD    Family History Family History  Problem Relation Age of Onset  . Hypertension Mother     Social History Social History   Tobacco Use  . Smoking status: Former Smoker    Packs/day: 0.50    Types: Cigarettes  . Smokeless tobacco: Current User  Substance Use Topics  . Alcohol use: No    Alcohol/week: 4.2 oz    Types: 7 Cans of beer per week  . Drug use: No     Allergies   Patient has no known allergies.   Review of Systems Review of Systems  Constitutional: Negative for fever.  Respiratory: Negative for shortness of breath.   Cardiovascular: Negative for chest pain, palpitations and leg swelling.  Gastrointestinal: Negative for abdominal pain.  Musculoskeletal: Negative for back pain.  Neurological: Negative for dizziness, facial asymmetry, speech difficulty, weakness, numbness and headaches.  Psychiatric/Behavioral: Negative for behavioral problems, confusion, decreased concentration, dysphoric mood, hallucinations, self-injury and suicidal ideas. The patient is nervous/anxious.   All other systems reviewed and are negative.    Physical Exam Updated Vital Signs BP (!) 149/79 (BP Location: Left Arm)   Pulse 77   Temp 98.4 F (36.9 C) (Oral)   Resp 18   Ht 5\' 8"  (1.727 m)   Wt (!) 146.5 kg (323 lb)   LMP 05/22/2017 (Approximate)   SpO2 100%   BMI 49.11 kg/m   Physical Exam  Constitutional: She  is oriented to person, place, and time. She appears well-developed and well-nourished. No distress.  HENT:  Head: Normocephalic and atraumatic.  Mouth/Throat: Oropharynx is clear and moist. No oropharyngeal exudate.  Eyes: Conjunctivae and EOM are normal. Pupils are equal, round, and reactive to light.  Neck: Normal range of motion. Neck supple.  Cardiovascular: Normal rate, regular rhythm, normal heart sounds and intact distal pulses.  Pulmonary/Chest: Effort normal and breath sounds normal. No stridor. She has no wheezes. She has no rales.  Abdominal: Soft. Bowel sounds are normal. She exhibits no mass. There is no tenderness. There is no rebound and no guarding.  Musculoskeletal: Normal range of motion.  Neurological: She is alert and oriented to person, place, and time.  Skin: Skin is warm and dry. Capillary refill takes less than 2 seconds.  Psychiatric: Her behavior is normal. Thought content normal.     ED Treatments / Results  Labs (all labs ordered are listed, but only abnormal results are displayed)  Results for orders placed or performed during the hospital encounter of 05/29/17  CBC with Differential  Result Value Ref Range   WBC 7.6 4.0 - 10.5 K/uL   RBC 5.19 (H) 3.87 - 5.11 MIL/uL   Hemoglobin 14.2 12.0 - 15.0 g/dL   HCT 16.143.2 09.636.0 - 04.546.0 %   MCV 83.2 78.0 - 100.0 fL   MCH 27.4 26.0 - 34.0 pg   MCHC 32.9 30.0 - 36.0 g/dL   RDW 40.914.0 81.111.5 - 91.415.5 %   Platelets 501 (H) 150 - 400 K/uL   Neutrophils Relative % 66 %   Neutro Abs 5.0 1.7 - 7.7 K/uL   Lymphocytes Relative 22 %   Lymphs Abs 1.7 0.7 - 4.0 K/uL   Monocytes Relative 10 %   Monocytes Absolute 0.8 0.1 - 1.0 K/uL   Eosinophils Relative 1 %   Eosinophils Absolute 0.1 0.0 - 0.7 K/uL   Basophils Relative 1 %   Basophils Absolute 0.0 0.0 - 0.1 K/uL  Basic metabolic panel  Result Value Ref Range   Sodium 137 135 - 145 mmol/L   Potassium 3.7 3.5 - 5.1 mmol/L   Chloride 107 101 - 111 mmol/L   CO2 21 (L) 22 - 32  mmol/L   Glucose, Bld 81 65 - 99 mg/dL   BUN 8 6 - 20 mg/dL   Creatinine, Ser 7.820.76 0.44 - 1.00 mg/dL   Calcium 8.8 (L) 8.9 - 10.3 mg/dL   GFR calc non Af Amer >60 >60 mL/min   GFR calc Af Amer >60 >60 mL/min   Anion gap 9 5 - 15  Urinalysis, Routine w reflex microscopic  Result Value Ref Range   Color, Urine AMBER (A) YELLOW   APPearance HAZY (A) CLEAR   Specific Gravity, Urine 1.029 1.005 - 1.030   pH 5.0 5.0 - 8.0   Glucose, UA NEGATIVE NEGATIVE mg/dL   Hgb urine dipstick NEGATIVE NEGATIVE   Bilirubin Urine  NEGATIVE NEGATIVE   Ketones, ur 20 (A) NEGATIVE mg/dL   Protein, ur 30 (A) NEGATIVE mg/dL   Nitrite NEGATIVE NEGATIVE   Leukocytes, UA NEGATIVE NEGATIVE   RBC / HPF 0-5 0 - 5 RBC/hpf   WBC, UA 0-5 0 - 5 WBC/hpf   Bacteria, UA NONE SEEN NONE SEEN   Squamous Epithelial / LPF 0-5 (A) NONE SEEN   Mucus PRESENT    Hyaline Casts, UA PRESENT   Rapid urine drug screen (hospital performed)  Result Value Ref Range   Opiates NONE DETECTED NONE DETECTED   Cocaine POSITIVE (A) NONE DETECTED   Benzodiazepines NONE DETECTED NONE DETECTED   Amphetamines NONE DETECTED NONE DETECTED   Tetrahydrocannabinol POSITIVE (A) NONE DETECTED   Barbiturates NONE DETECTED NONE DETECTED  hCG, quantitative, pregnancy  Result Value Ref Range   hCG, Beta Chain, Quant, S <1 <5 mIU/mL  Ethanol  Result Value Ref Range   Alcohol, Ethyl (B) <10 <10 mg/dL   No results found.   Procedures Procedures (including critical care time)    Final Clinical Impressions(s) / ED Diagnoses   Informed of UDS results.  She states she was concerned there was something in the cup.  Admits to marijuana.  I believe this is a side effect of the cocaine and marijuana that are in her system.  She denies CP SOB no abdominal or back pain.  Exam and vitals are benign and reassuring.  She has walked and PO challenged in the ED.  She is stable for discharge at this time.    Return for worsening pain, fevers > 100.4  unrelieved by medication, chest pain, shortness of breath, intractable vomiting, or diarrhea, abdominal pain, Inability to tolerate liquids or food, cough, altered mental status or any concerns. No signs of systemic illness or infection. The patient is nontoxic-appearing on exam and vital signs are within normal limits.    I have reviewed the triage vital signs and the nursing notes. Pertinent labs &imaging results that were available during my care of the patient were reviewed by me and considered in my medical decision making (see chart for details).  After history, exam, and medical workup I feel the patient has been appropriately medically screened and is safe for discharge home. Pertinent diagnoses were discussed with the patient. Patient was given return precautions.    Tashira Torre, MD 05/30/17 [phone]

## 2017-06-24 DIAGNOSIS — Z87891 Personal history of nicotine dependence: Secondary | ICD-10-CM | POA: Diagnosis not present

## 2017-06-24 DIAGNOSIS — H65191 Other acute nonsuppurative otitis media, right ear: Secondary | ICD-10-CM | POA: Diagnosis not present

## 2017-06-24 DIAGNOSIS — I10 Essential (primary) hypertension: Secondary | ICD-10-CM | POA: Insufficient documentation

## 2017-06-24 DIAGNOSIS — J069 Acute upper respiratory infection, unspecified: Secondary | ICD-10-CM | POA: Insufficient documentation

## 2017-06-24 DIAGNOSIS — H9201 Otalgia, right ear: Secondary | ICD-10-CM | POA: Diagnosis present

## 2017-06-24 DIAGNOSIS — Z79899 Other long term (current) drug therapy: Secondary | ICD-10-CM | POA: Diagnosis not present

## 2017-06-25 ENCOUNTER — Encounter (HOSPITAL_COMMUNITY): Payer: Self-pay

## 2017-06-25 ENCOUNTER — Emergency Department (HOSPITAL_COMMUNITY)
Admission: EM | Admit: 2017-06-25 | Discharge: 2017-06-25 | Disposition: A | Discharge status: Home / Self Care | Payer: Medicaid Other | Attending: Emergency Medicine | Admitting: Emergency Medicine

## 2017-06-25 DIAGNOSIS — H65199 Other acute nonsuppurative otitis media, unspecified ear: Secondary | ICD-10-CM

## 2017-06-25 DIAGNOSIS — J069 Acute upper respiratory infection, unspecified: Secondary | ICD-10-CM

## 2017-06-25 LAB — RAPID STREP SCREEN (MED CTR MEBANE ONLY): Streptococcus, Group A Screen (Direct): NEGATIVE

## 2017-06-25 MED ORDER — IBUPROFEN 600 MG PO TABS: 600.0000 mg | ORAL_TABLET | Freq: Four times a day (QID) | ORAL | 0 refills | Status: DC | PRN

## 2017-06-25 NOTE — ED Notes (Signed)
Bed: WLPT1 Expected date:  Expected time:  Means of arrival:  Comments: 

## 2017-06-25 NOTE — ED Provider Notes (Signed)
[facility]-EMERGENCY DEPT Provider Note   CSN: [id number] Arrival date & time: 06/24/17  2317     History   Chief Complaint Chief Complaint  Patient presents with  . Sore Throat  . Otalgia    HPI Terri Tran is a 27 y.o. female.  HPI Pt comes in with cc of earache and sore throat. Pt's children have uri like symptoms. Her symptoms started today and she is having no n/v/f/c. Earache is to the R ear.  Past Medical History:  Diagnosis Date  . Anemia affecting first pregnancy 2013   was taking Iron supplements  . Depression 2013   postpartum depression after first delivery; was prescribed Xanax  . GERD (gastroesophageal reflux disease)    during pregnancy only; takes Tums; helps  . Hypertension     Patient Active Problem List   Diagnosis Date Noted  . GBS (group B Streptococcus carrier), +RV culture, currently pregnant 09/02/2016  . Chronic hypertension affecting pregnancy 08/31/2016  . Insufficient prenatal care 08/28/2016  . Obesity in pregnancy 08/28/2016  . BMI 50.0-59.9, adult (HCC) 08/28/2016  . Supervision of high risk pregnancy, antepartum 02/02/2016  . Chronic hypertension in pregnancy 02/02/2016  . Previous cesarean section complicating pregnancy 02/02/2016    Past Surgical History:  Procedure Laterality Date  . CESAREAN SECTION    . CESAREAN SECTION N/A 09/02/2016   Procedure: CESAREAN SECTION;  Surgeon: Lesly DukesKelly H Leggett, MD;  [facility];  Service: Obstetrics;  Laterality: N/A;    OB History    Gravida Para Term Preterm AB Living   2 2 2  0 0 2   SAB TAB Ectopic Multiple Live Births   0 0 0 0 2       Home Medications    Prior to Admission medications   Medication Sig Start Date End Date Taking? Authorizing Provider  ibuprofen (ADVIL,MOTRIN) 600 MG tablet Take 1 tablet (600 mg total) by mouth every 6 (six) hours as needed. 06/25/17   Derwood KaplanNanavati, Rosemond Lyttle, MD  labetalol (NORMODYNE) 100 MG tablet Take 1 tablet (100  mg total) by mouth 2 (two) times daily. 04/24/16   Lorne SkeensSchenk, Nicholas Michael, MD  oxyCODONE-acetaminophen (PERCOCET/ROXICET) 5-325 MG tablet Take 1-2 tablets by mouth every 4 (four) hours as needed for moderate pain or severe pain. 09/04/16   Allie Bossierove, Myra C, MD  Prenatal Vit-Fe Fumarate-FA (PRENATAL VITAMINS) 28-0.8 MG TABS Take 1 tablet by mouth daily. 04/02/16   Lorne SkeensSchenk, Nicholas Michael, MD    Family History Family History  Problem Relation Age of Onset  . Hypertension Mother     Social History Social History   Tobacco Use  . Smoking status: Former Smoker    Packs/day: 0.50    Types: Cigarettes  . Smokeless tobacco: Current User  Substance Use Topics  . Alcohol use: No    Alcohol/week: 4.2 oz    Types: 7 Cans of beer per week  . Drug use: No     Allergies   Patient has no known allergies.   Review of Systems Review of Systems  All other systems reviewed and are negative.    Physical Exam Updated Vital Signs BP (!) 136/92 (BP Location: Right Arm)   Pulse 69   Temp 97.9 F (36.6 C) (Oral)   Resp 20   Wt (!) 149.7 kg (330 lb)   SpO2 98%   BMI 50.18 kg/m   Physical Exam  Constitutional: She appears well-developed and well-nourished. No distress.  HENT:  Head: Normocephalic and  atraumatic.  Right Ear: Hearing and ear canal normal. There is tenderness. A middle ear effusion is present.  Left Ear: Hearing and ear canal normal. No drainage, swelling or tenderness.  No middle ear effusion.  Mouth/Throat: Oropharynx is clear and moist and mucous membranes are normal.  Eyes: Conjunctivae are normal.  Neck: Neck supple.  Cardiovascular: Normal rate and regular rhythm.  No murmur heard. Pulmonary/Chest: Effort normal and breath sounds normal. No respiratory distress.  Abdominal: Soft. There is no tenderness.  Musculoskeletal: She exhibits no edema.  Neurological: She is alert.  Skin: Skin is warm and dry.  Psychiatric: She has a normal mood and affect.  Nursing note  and vitals reviewed.    ED Treatments / Results  Labs (all labs ordered are listed, but only abnormal results are displayed) Labs Reviewed  RAPID STREP SCREEN (NOT AT [facility])  CULTURE, GROUP A STREP [facility])    EKG  EKG Interpretation None       Radiology No results found.  Procedures Procedures (including critical care time)  Medications Ordered in ED Medications - No data to display   Initial Impression / Assessment and Plan / ED Course  I have reviewed the triage vital signs and the nursing notes.  Pertinent labs & imaging results that were available during my care of the patient were reviewed by me and considered in my medical decision making (see chart for details).     Pt has viral URI. She appears well and has no significant medical comorbidities. Stable for d/c.  Final Clinical Impressions(s) / ED Diagnoses   Final diagnoses:  Viral URI  Other acute nonsuppurative otitis media, recurrence not specified, unspecified laterality    ED Discharge Orders        Ordered    ibuprofen (ADVIL,MOTRIN) 600 MG tablet  Every 6 hours PRN     06/25/17 0341       Derwood Kaplan, MD 06/25/17 0345

## 2017-06-25 NOTE — ED Triage Notes (Signed)
Pt complains of a sore throat and ear pain for one week

## 2017-06-27 LAB — CULTURE, GROUP A STREP (THRC)

## 2017-09-04 ENCOUNTER — Ambulatory Visit (HOSPITAL_COMMUNITY)
Admission: EM | Admit: 2017-09-04 | Discharge: 2017-09-04 | Disposition: A | Discharge status: Home / Self Care | Payer: Medicaid Other | Attending: Family Medicine | Admitting: Family Medicine

## 2017-09-04 ENCOUNTER — Other Ambulatory Visit: Payer: Self-pay

## 2017-09-04 ENCOUNTER — Encounter (HOSPITAL_COMMUNITY): Payer: Self-pay | Admitting: Emergency Medicine

## 2017-09-04 DIAGNOSIS — J029 Acute pharyngitis, unspecified: Secondary | ICD-10-CM

## 2017-09-04 DIAGNOSIS — H9201 Otalgia, right ear: Secondary | ICD-10-CM

## 2017-09-04 DIAGNOSIS — J020 Streptococcal pharyngitis: Secondary | ICD-10-CM

## 2017-09-04 LAB — POCT RAPID STREP A: STREPTOCOCCUS, GROUP A SCREEN (DIRECT): POSITIVE — AB

## 2017-09-04 MED ORDER — PENICILLIN G BENZATHINE 1200000 UNIT/2ML IM SUSP
INTRAMUSCULAR | Status: AC
  Filled 2017-09-04: qty 2

## 2017-09-04 MED ORDER — LIDOCAINE VISCOUS 2 % MT SOLN: 15.0000 mL | OROMUCOSAL | 0 refills | Status: DC | PRN

## 2017-09-04 MED ORDER — FLUTICASONE PROPIONATE 50 MCG/ACT NA SUSP: 2.0000 | Freq: Every day | NASAL | 0 refills | Status: DC

## 2017-09-04 MED ORDER — CETIRIZINE HCL 10 MG PO TABS: 10.0000 mg | ORAL_TABLET | Freq: Every day | ORAL | 0 refills | Status: DC

## 2017-09-04 MED ORDER — IPRATROPIUM BROMIDE 0.06 % NA SOLN: 2.0000 | Freq: Four times a day (QID) | NASAL | 0 refills | Status: DC

## 2017-09-04 MED ORDER — PENICILLIN G BENZATHINE 1200000 UNIT/2ML IM SUSP
1.2000 10*6.[IU] | Freq: Once | INTRAMUSCULAR | Status: AC
  Administered 2017-09-04: 1.2 10*6.[IU] via INTRAMUSCULAR

## 2017-09-04 NOTE — ED Triage Notes (Signed)
Sore throat onset 2 days ago, and right ear pain

## 2017-09-04 NOTE — Discharge Instructions (Signed)
Bicillin injection in office today for strep. Start flonase, zyrtec, atrovent for nasal congestion/rhinorrhea. You can gurgle lidocaine solution to help with sore throat. Wait 30-40 mins prior to eating or drinking as it can stunt your gag reflex. Monitor for any worsening of symptoms, trouble breathing, trouble swallowing, swelling of the throat, follow up here or at the emergency department for reevaluation.

## 2017-09-04 NOTE — ED Provider Notes (Signed)
[facility]    CSN: [id number] Arrival date & time: 09/04/17  1511     History   Chief Complaint Chief Complaint  Patient presents with  . Sore Throat  . Otalgia    HPI Terri Tran is a 27 y.o. female.   27 year old female comes in for 2-day history of sore throat and right ear pain.  Has also had  nasal congestion, rhinorrhea, no obvious cough.  No documented fever, but has felt chills.  Has been treating with OTC medications without relief.  Has been eating and drinking without problems, though with painful swallowing.  Denies swelling of the throat, trouble breathing. Former smoker.     Past Medical History:  Diagnosis Date  . Anemia affecting first pregnancy 2013   was taking Iron supplements  . Depression 2013   postpartum depression after first delivery; was prescribed Xanax  . GERD (gastroesophageal reflux disease)    during pregnancy only; takes Tums; helps  . Hypertension     Patient Active Problem List   Diagnosis Date Noted  . GBS (group B Streptococcus carrier), +RV culture, currently pregnant 09/02/2016  . Chronic hypertension affecting pregnancy 08/31/2016  . Insufficient prenatal care 08/28/2016  . Obesity in pregnancy 08/28/2016  . BMI 50.0-59.9, adult (HCC) 08/28/2016  . Supervision of high risk pregnancy, antepartum 02/02/2016  . Chronic hypertension in pregnancy 02/02/2016  . Previous cesarean section complicating pregnancy 02/02/2016    Past Surgical History:  Procedure Laterality Date  . CESAREAN SECTION    . CESAREAN SECTION N/A 09/02/2016   Procedure: CESAREAN SECTION;  Surgeon: Lesly Dukes, MD;  Location: [facility] BIRTHING SUITES;  Service: Obstetrics;  Laterality: N/A;    OB History    Gravida  2   Para  2   Term  2   Preterm  0   AB  0   Living  2     SAB  0   TAB  0   Ectopic  0   Multiple  0   Live Births  2            Home Medications    Prior to Admission medications   Medication Sig Start  Date End Date Taking? Authorizing Provider  labetalol (NORMODYNE) 100 MG tablet Take 1 tablet (100 mg total) by mouth 2 (two) times daily. 04/24/16  Yes Lorne Skeens, MD  cetirizine (ZYRTEC) 10 MG tablet Take 1 tablet (10 mg total) by mouth daily. 09/04/17   Cathie Hoops, Leotha Westermeyer V, PA-C  fluticasone (FLONASE) 50 MCG/ACT nasal spray Place 2 sprays into both nostrils daily. 09/04/17   Cathie Hoops, Jamilynn Whitacre V, PA-C  ibuprofen (ADVIL,MOTRIN) 600 MG tablet Take 1 tablet (600 mg total) by mouth every 6 (six) hours as needed. 06/25/17   Derwood Kaplan, MD  ipratropium (ATROVENT) 0.06 % nasal spray Place 2 sprays into both nostrils 4 (four) times daily. 09/04/17   Cathie Hoops, Nahiem Dredge V, PA-C  lidocaine (XYLOCAINE) 2 % solution Use as directed 15 mLs in the mouth or throat as needed for mouth pain. 09/04/17   Belinda Fisher, PA-C  oxyCODONE-acetaminophen (PERCOCET/ROXICET) 5-325 MG tablet Take 1-2 tablets by mouth every 4 (four) hours as needed for moderate pain or severe pain. 09/04/16   Allie Bossier, MD  Prenatal Vit-Fe Fumarate-FA (PRENATAL VITAMINS) 28-0.8 MG TABS Take 1 tablet by mouth daily. 04/02/16   Lorne Skeens, MD    Family History Family History  Problem Relation Age of Onset  . Hypertension Mother  Social History Social History   Tobacco Use  . Smoking status: Former Smoker    Packs/day: 0.50    Types: Cigarettes  . Smokeless tobacco: Current User  Substance Use Topics  . Alcohol use: No    Alcohol/week: 4.2 oz    Types: 7 Cans of beer per week  . Drug use: No     Allergies   Patient has no known allergies.   Review of Systems Review of Systems  Reason unable to perform ROS: See HPI as above.     Physical Exam Triage Vital Signs ED Triage Vitals  Enc Vitals Group     BP 09/04/17 1535 (!) 142/81     Pulse Rate 09/04/17 1535 81     Resp 09/04/17 1535 20     Temp 09/04/17 1535 98.9 F (37.2 C)     Temp Source 09/04/17 1535 Oral     SpO2 09/04/17 1535 98 %     Weight --      Height --       Head Circumference --      Peak Flow --      Pain Score 09/04/17 1533 8     Pain Loc --      Pain Edu? --      Excl. in GC? --    No data found.  Updated Vital Signs BP (!) 142/81 (BP Location: Left Arm) Comment (BP Location): large cuff  Pulse 81   Temp 98.9 F (37.2 C) (Oral)   Resp 20   LMP 08/28/2017   SpO2 98%   Physical Exam  Constitutional: She is oriented to person, place, and time. She appears well-developed and well-nourished. No distress.  HENT:  Head: Normocephalic and atraumatic.  Right Ear: Tympanic membrane, external ear and ear canal normal. Tympanic membrane is not erythematous and not bulging.  Left Ear: Tympanic membrane, external ear and ear canal normal. Tympanic membrane is not erythematous and not bulging.  Nose: Rhinorrhea present. Right sinus exhibits maxillary sinus tenderness and frontal sinus tenderness. Left sinus exhibits maxillary sinus tenderness and frontal sinus tenderness.  Mouth/Throat: Uvula is midline and mucous membranes are normal. Posterior oropharyngeal erythema present. Tonsils are 3+ on the right. Tonsils are 3+ on the left. Tonsillar exudate.  Eyes: Pupils are equal, round, and reactive to light. Conjunctivae are normal.  Neck: Normal range of motion. Neck supple.  Cardiovascular: Normal rate, regular rhythm and normal heart sounds. Exam reveals no gallop and no friction rub.  No murmur heard. Pulmonary/Chest: Effort normal and breath sounds normal. She has no decreased breath sounds. She has no wheezes. She has no rhonchi. She has no rales.  Lymphadenopathy:    She has no cervical adenopathy.  Neurological: She is alert and oriented to person, place, and time.  Skin: Skin is warm and dry.  Psychiatric: She has a normal mood and affect. Her behavior is normal. Judgment normal.     UC Treatments / Results  Labs (all labs ordered are listed, but only abnormal results are displayed) Labs Reviewed  POCT RAPID STREP A - Abnormal;  Notable for the following components:      Result Value   Streptococcus, Group A Screen (Direct) POSITIVE (*)    All other components within normal limits    EKG None Radiology No results found.  Procedures Procedures (including critical care time)  Medications Ordered in UC Medications  penicillin g benzathine (BICILLIN LA) 1200000 UNIT/2ML injection 1.2 Million Units (1.2 Million Units Intramuscular Given  09/04/17 1614)     Initial Impression / Assessment and Plan / UC Course  I have reviewed the triage vital signs and the nursing notes.  Pertinent labs & imaging results that were available during my care of the patient were reviewed by me and considered in my medical decision making (see chart for details).    Rapid strep positive.  Bicillin injection in office.  Patient also with right ear otalgia without obvious otitis media or externa.  Will treat for eustachian tube dysfunction.  Flonase, Atrovent, Zyrtec as directed.  Other symptomatic treatment discussed.  Return precautions given.  Patient expresses understanding and agrees to plan.  Final Clinical Impressions(s) / UC Diagnoses   Final diagnoses:  Strep pharyngitis  Right ear pain    ED Discharge Orders        Ordered    fluticasone (FLONASE) 50 MCG/ACT nasal spray  Daily     09/04/17 1601    ipratropium (ATROVENT) 0.06 % nasal spray  4 times daily     09/04/17 1601    cetirizine (ZYRTEC) 10 MG tablet  Daily     09/04/17 1601    lidocaine (XYLOCAINE) 2 % solution  As needed     09/04/17 1601        Belinda FisherYu, Emslee Lopezmartinez V, PA-C 09/04/17 1629

## 2017-09-12 ENCOUNTER — Encounter: Payer: Self-pay | Admitting: *Deleted

## 2017-09-18 ENCOUNTER — Encounter (HOSPITAL_COMMUNITY): Payer: Self-pay | Admitting: Emergency Medicine

## 2017-09-18 ENCOUNTER — Ambulatory Visit (HOSPITAL_COMMUNITY)
Admission: EM | Admit: 2017-09-18 | Discharge: 2017-09-18 | Disposition: A | Discharge status: Home / Self Care | Payer: Medicaid Other | Attending: Family Medicine | Admitting: Family Medicine

## 2017-09-18 DIAGNOSIS — Z87891 Personal history of nicotine dependence: Secondary | ICD-10-CM | POA: Insufficient documentation

## 2017-09-18 DIAGNOSIS — Z79899 Other long term (current) drug therapy: Secondary | ICD-10-CM | POA: Insufficient documentation

## 2017-09-18 DIAGNOSIS — R1110 Vomiting, unspecified: Secondary | ICD-10-CM | POA: Insufficient documentation

## 2017-09-18 DIAGNOSIS — I10 Essential (primary) hypertension: Secondary | ICD-10-CM | POA: Diagnosis not present

## 2017-09-18 DIAGNOSIS — R197 Diarrhea, unspecified: Secondary | ICD-10-CM | POA: Diagnosis not present

## 2017-09-18 DIAGNOSIS — R112 Nausea with vomiting, unspecified: Secondary | ICD-10-CM

## 2017-09-18 DIAGNOSIS — R300 Dysuria: Secondary | ICD-10-CM

## 2017-09-18 DIAGNOSIS — Z3202 Encounter for pregnancy test, result negative: Secondary | ICD-10-CM

## 2017-09-18 DIAGNOSIS — N3001 Acute cystitis with hematuria: Secondary | ICD-10-CM | POA: Insufficient documentation

## 2017-09-18 LAB — POCT URINALYSIS DIP (DEVICE)
BILIRUBIN URINE: NEGATIVE
Glucose, UA: NEGATIVE mg/dL
KETONES UR: NEGATIVE mg/dL
NITRITE: NEGATIVE
PH: 5.5 (ref 5.0–8.0)
Protein, ur: 100 mg/dL — AB
Specific Gravity, Urine: 1.01 (ref 1.005–1.030)
Urobilinogen, UA: 0.2 mg/dL (ref 0.0–1.0)

## 2017-09-18 LAB — POCT I-STAT, CHEM 8
BUN: 10 mg/dL (ref 6–20)
CHLORIDE: 106 mmol/L (ref 101–111)
Calcium, Ion: 1.18 mmol/L (ref 1.15–1.40)
Creatinine, Ser: 0.8 mg/dL (ref 0.44–1.00)
Glucose, Bld: 94 mg/dL (ref 65–99)
HEMATOCRIT: 43 % (ref 36.0–46.0)
HEMOGLOBIN: 14.6 g/dL (ref 12.0–15.0)
POTASSIUM: 4.1 mmol/L (ref 3.5–5.1)
Sodium: 141 mmol/L (ref 135–145)
TCO2: 24 mmol/L (ref 22–32)

## 2017-09-18 LAB — POCT PREGNANCY, URINE: Preg Test, Ur: NEGATIVE

## 2017-09-18 MED ORDER — PHENAZOPYRIDINE HCL 200 MG PO TABS: 200.0000 mg | ORAL_TABLET | Freq: Three times a day (TID) | ORAL | 0 refills | Status: DC | PRN

## 2017-09-18 MED ORDER — CIPROFLOXACIN HCL 500 MG PO TABS: 500.0000 mg | ORAL_TABLET | Freq: Two times a day (BID) | ORAL | 0 refills | Status: AC

## 2017-09-18 MED ORDER — ONDANSETRON HCL 4 MG/2ML IJ SOLN
4.0000 mg | Freq: Once | INTRAMUSCULAR | Status: AC
  Administered 2017-09-18: 4 mg via INTRAMUSCULAR

## 2017-09-18 MED ORDER — ONDANSETRON HCL 4 MG/2ML IJ SOLN
INTRAMUSCULAR | Status: AC
  Filled 2017-09-18: qty 2

## 2017-09-18 MED ORDER — ONDANSETRON HCL 4 MG PO TABS: 4.0000 mg | ORAL_TABLET | Freq: Four times a day (QID) | ORAL | 0 refills | Status: DC

## 2017-09-18 NOTE — ED Triage Notes (Signed)
Pt sts vomiting and diarrhea starting last night; pt sts some blood flecks in vomit today; pt thinks could possibly have UTI

## 2017-09-18 NOTE — ED Provider Notes (Signed)
[facility]    CSN: [id number] Arrival date & time: 09/18/17  1604     History   Chief Complaint Chief Complaint  Patient presents with  . Emesis    HPI Terri Tran is a 27 y.o. female.   Complains of nausea, vomiting and diarrhea that began this morning.  States her son was recently sick and vomiting.  She has vomiting ">40 times."  Reports blood in her vomit.  Patient has had ">20" episodes of diarrhea.  She has tried taking pepto without relief.  Her symptom are made worst with eating.  She complains subjective fever, chills.  She denies melena, hematochezia.  Patient also complains of dysuria that began 1 week ago.  She denies a precipitating event, or recent antibiotic use, but attributes her symptoms to not drinking enough water.  Patient also complains of associated intermittent suprapubic pain that is sharp in character. She has not tried taking OTC medications.  Her symptoms are made worse with urination.  She complains of increased urgency, frequency, flank pain, incomplete emptying, and enuresis.  She denies hematuria.    Patient also would like to be tested for STDs at this time.         Past Medical History:  Diagnosis Date  . Anemia affecting first pregnancy 2013   was taking Iron supplements  . Depression 2013   postpartum depression after first delivery; was prescribed Xanax  . GERD (gastroesophageal reflux disease)    during pregnancy only; takes Tums; helps  . Hypertension     Patient Active Problem List   Diagnosis Date Noted  . GBS (group B Streptococcus carrier), +RV culture, currently pregnant 09/02/2016  . Chronic hypertension affecting pregnancy 08/31/2016  . Insufficient prenatal care 08/28/2016  . Obesity in pregnancy 08/28/2016  . BMI 50.0-59.9, adult (HCC) 08/28/2016  . Supervision of high risk pregnancy, antepartum 02/02/2016  . Chronic hypertension in pregnancy 02/02/2016  . Previous cesarean section complicating pregnancy  02/02/2016    Past Surgical History:  Procedure Laterality Date  . CESAREAN SECTION    . CESAREAN SECTION N/A 09/02/2016   Procedure: CESAREAN SECTION;  Surgeon: Lesly Dukes, MD;  Location: [facility]TH[facility] SU[facility];  Service: Obstetrics;  Laterality: N/A;    OB History    Gravida  2   Para  2   Term  2   Preterm  0   AB  0   Living  2     SAB  0   TAB  0   Ectopic  0   Multiple  0   Live Births  2            Home Medications    Prior to Admission medications   Medication Sig Start Date End Date Taking? Authorizing Provider  cetirizine (ZYRTEC) 10 MG tablet Take 1 tablet (10 mg total) by mouth daily. 09/04/17   Cathie Hoops, Amy V, PA-C  ciprofloxacin (CIPRO) 500 MG tablet Take 1 tablet (500 mg total) by mouth every 12 (twelve) hours for 7 days. 09/18/17 09/25/17  Jonique Kulig, [place], PA-C  fluticasone (FLONASE) 50 MCG/ACT nasal spray Place 2 sprays into both nostrils daily. 09/04/17   Cathie Hoops, Amy V, PA-C  ibuprofen (ADVIL,MOTRIN) 600 MG tablet Take 1 tablet (600 mg total) by mouth every 6 (six) hours as needed. 06/25/17   Derwood Kaplan, MD  ipratropium (ATROVENT) 0.06 % nasal spray Place 2 sprays into both nostrils 4 (four) times daily. 09/04/17   Cathie Hoops, Amy V, PA-C  labetalol (  NORMODYNE) 100 MG tablet Take 1 tablet (100 mg total) by mouth 2 (two) times daily. 04/24/16   Lorne Skeens, MD  lidocaine (XYLOCAINE) 2 % solution Use as directed 15 mLs in the mouth or throat as needed for mouth pain. 09/04/17   Cathie Hoops, Amy V, PA-C  ondansetron (ZOFRAN) 4 MG tablet Take 1 tablet (4 mg total) by mouth every 6 (six) hours. 09/18/17   Donnajean Chesnut, [place], PA-C  oxyCODONE-acetaminophen (PERCOCET/ROXICET) 5-325 MG tablet Take 1-2 tablets by mouth every 4 (four) hours as needed for moderate pain or severe pain. 09/04/16   Allie Bossier, MD  phenazopyridine (PYRIDIUM) 200 MG tablet Take 1 tablet (200 mg total) by mouth 3 (three) times daily as needed for pain. 09/18/17   Quincey Quesinberry, [place], PA-C  Prenatal Vit-Fe  Fumarate-FA (PRENATAL VITAMINS) 28-0.8 MG TABS Take 1 tablet by mouth daily. 04/02/16   Lorne Skeens, MD    Family History Family History  Problem Relation Age of Onset  . Hypertension Mother     Social History Social History   Tobacco Use  . Smoking status: Former Smoker    Packs/day: 0.50    Types: Cigarettes  . Smokeless tobacco: Current User  Substance Use Topics  . Alcohol use: No    Alcohol/week: 4.2 oz    Types: 7 Cans of beer per week  . Drug use: No     Allergies   Patient has no known allergies.   Review of Systems Review of Systems  Constitutional: Positive for appetite change, chills, fatigue and fever (subjective). Negative for unexpected weight change.  Respiratory: Negative for shortness of breath.   Cardiovascular: Negative for chest pain.  Gastrointestinal: Positive for abdominal pain, diarrhea, nausea and vomiting. Negative for constipation.  Genitourinary: Positive for decreased urine volume, difficulty urinating, dysuria, enuresis, flank pain, frequency and urgency. Negative for hematuria.     Physical Exam Triage Vital Signs ED Triage Vitals [09/18/17 1624]  Enc Vitals Group     BP (!) 143/83     Pulse Rate 90     Resp 18     Temp 98.8 F (37.1 C)     Temp Source Oral     SpO2 99 %     Weight      Height      Head Circumference      Peak Flow      Pain Score      Pain Loc      Pain Edu?      Excl. in GC?    No data found.  Updated Vital Signs BP (!) 143/83 (BP Location: Right Arm)   Pulse 90   Temp 98.8 F (37.1 C) (Oral)   Resp 18   LMP 08/28/2017   SpO2 99%   Physical Exam  Constitutional: She is oriented to person, place, and time. She appears well-developed and well-nourished. No distress.  Does not appear toxic or in distress.  Appears well-hydrated.    HENT:  Head: Normocephalic and atraumatic.  Right Ear: External ear normal.  Left Ear: External ear normal.  Nose: Nose normal.  Mouth/Throat:  Oropharynx is clear and moist. No oropharyngeal exudate.  Eyes: Pupils are equal, round, and reactive to light. EOM are normal. No scleral icterus.  Neck: Normal range of motion. Neck supple.  Cardiovascular: Normal rate, regular rhythm and normal heart sounds. Exam reveals no gallop and no friction rub.  No murmur heard. Radial pulse 2+ bilaterally    Pulmonary/Chest: Effort normal and  breath sounds normal. No stridor. No respiratory distress. She has no wheezes. She has no rales.  Abdominal: Soft. Bowel sounds are normal. There is no tenderness. There is no guarding.  Protuberant abdomen Negative CVA tenderness  Lymphadenopathy:    She has no cervical adenopathy.  Neurological: She is alert and oriented to person, place, and time.  Skin: Skin is warm and dry. Capillary refill takes 2 to 3 seconds. She is not diaphoretic.  Psychiatric: She has a normal mood and affect. Her behavior is normal. Thought content normal.  Nursing note and vitals reviewed.    UC Treatments / Results  Labs (all labs ordered are listed, but only abnormal results are displayed) Labs Reviewed  POCT URINALYSIS DIP (DEVICE) - Abnormal; Notable for the following components:      Result Value   Hgb urine dipstick MODERATE (*)    Protein, ur 100 (*)    Leukocytes, UA LARGE (*)    All other components within normal limits  URINE CULTURE  POCT PREGNANCY, URINE  POCT I-STAT, CHEM 8  URINE CYTOLOGY ANCILLARY ONLY    EKG None  Radiology No results found.  Procedures Procedures (including critical care time)  Medications Ordered in UC Medications  ondansetron (ZOFRAN) injection 4 mg (4 mg Intramuscular Given 09/18/17 1736)    Initial Impression / Assessment and Plan / UC Course  I have reviewed the triage vital signs and the nursing notes.  Pertinent labs & imaging results that were available during my care of the patient were reviewed by me and considered in my medical decision making (see chart for  details).     Patient complains of urinary tract symptoms for past week.  Urine showed blood and leukocytes.  Urine culture sent.  Hx and symptoms are consistent with UTI or pyelonephritis.  PE reveals a well-developed, well-nourished female that is not in acute distress and does not appear toxic or dehydrated.  Abdominal exam is unremarkable, and negative CVA tenderness.  Patient also complains of nausea, vomiting 40+ times, and diarrhea 20+ times.  VS are stable.  Patient is afebrile, not hypotensive, or tachycardic.  Chem-8 was ordered and was WNL.  Zofran given in office.  Patient prescribed ciprofloxacin for broad coverage of acute cystis or possible pyelonephritis considering patient's recent symptoms of fever, chills and vomiting.  Prescribed pyridium as needed for symptomatic relief of dysuria.  Prescribed zofran as needed for symptomatic relief of nausea.  I recommended diet modification and instructed the patient on how to advance her diet as tolerated.  Strict return and ER precautions given. We will follow up with patient regarding her results.   Final Clinical Impressions(s) / UC Diagnoses   Final diagnoses:  Acute cystitis with hematuria  Nausea vomiting and diarrhea     Discharge Instructions     UTI Instructions: Prescribed ciprofloxacin.  Take medication as prescribed and to completion Prescribed pyridium as needed for symptomatic relief Urine sent for culture and STI testing.  We will follow up with you regarding the results of your testing Follow up with PCP if symptoms persists.   Return or go to ER if you have any new or worsening symptoms.    Nausea, vomiting, and diarrhea Instructions:  Zofran prescribed.  Take as directed.   DIET Instructions: 30 minutes after taking nausea medicine, begin with sips of clear liquids. If able to hold down 2 - 4 ounces for 30 minutes, begin drinking more. Increase your fluid intake to replace losses. Clear liquids only  for 24 hours  (water, tea, sport drinks, clear flat ginger ale or cola and juices, broth, jello, popsicles, ect). Advance to bland foods, applesauce, rice, baked or boiled chicken, ect. Avoid milk, greasy foods and anything that doesn't agree with you. If vomiting persists and you are unable to hold fluids, return here or go to the ER. If diarrhea persists more than 4 days, or becomes bloody, or if you develop high fever or abdominal pain, return here, see your doctor or go to the ER.     ED Prescriptions    Medication Sig Dispense Auth. Provider   ondansetron (ZOFRAN) 4 MG tablet Take 1 tablet (4 mg total) by mouth every 6 (six) hours. 12 tablet Tyronda Vizcarrondo, [place], PA-C   phenazopyridine (PYRIDIUM) 200 MG tablet Take 1 tablet (200 mg total) by mouth 3 (three) times daily as needed for pain. 9 tablet Bless Lisenby, [place], PA-C   ciprofloxacin (CIPRO) 500 MG tablet Take 1 tablet (500 mg total) by mouth every 12 (twelve) hours for 7 days. 14 tablet Gabrianna Fassnacht, [place], PA-C     Controlled Substance Prescriptions [place] Controlled Substance Registry consulted? Not Applicable   Rennis Harding, [place] 09/18/17 1610

## 2017-09-18 NOTE — Discharge Instructions (Signed)
UTI Instructions: Prescribed ciprofloxacin.  Take medication as prescribed and to completion Prescribed pyridium as needed for symptomatic relief Urine sent for culture and STI testing.  We will follow up with you regarding the results of your testing Follow up with PCP if symptoms persists.   Return or go to ER if you have any new or worsening symptoms.    Nausea, vomiting, and diarrhea Instructions:  Zofran prescribed.  Take as directed.   DIET Instructions: 30 minutes after taking nausea medicine, begin with sips of clear liquids. If able to hold down 2 - 4 ounces for 30 minutes, begin drinking more. Increase your fluid intake to replace losses. Clear liquids only for 24 hours (water, tea, sport drinks, clear flat ginger ale or cola and juices, broth, jello, popsicles, ect). Advance to bland foods, applesauce, rice, baked or boiled chicken, ect. Avoid milk, greasy foods and anything that doesn?t agree with you. If vomiting persists and you are unable to hold fluids, return here or go to the ER. If diarrhea persists more than 4 days, or becomes bloody, or if you develop high fever or abdominal pain, return here, see your doctor or go to the ER.

## 2017-09-19 LAB — URINE CYTOLOGY ANCILLARY ONLY
Chlamydia: NEGATIVE
Neisseria Gonorrhea: NEGATIVE
TRICH (WINDOWPATH): NEGATIVE

## 2017-09-19 NOTE — Progress Notes (Signed)
Attempted to reach patient regarding normal results. No answer at this time.  

## 2017-09-21 LAB — URINE CULTURE: Culture: >=100000 — AB

## 2017-09-21 LAB — URINE CYTOLOGY ANCILLARY ONLY
BACTERIAL VAGINITIS: POSITIVE — AB
Candida vaginitis: NEGATIVE

## 2017-09-23 NOTE — Progress Notes (Signed)
Urine tested positive for E.Coli, this was treated with Cipro at urgent care visit. Pt called and made aware, encouraged pt to follow up for persistent symptoms. Also, Bacterial vaginosis is positive. This was not treated at the urgent care visit.  Patient denies symptoms at this time. Answered all questions and pt verbalized understanding.

## 2018-01-17 ENCOUNTER — Ambulatory Visit (HOSPITAL_COMMUNITY)
Admission: EM | Admit: 2018-01-17 | Discharge: 2018-01-17 | Disposition: A | Discharge status: Home / Self Care | Payer: Medicaid Other | Attending: Internal Medicine | Admitting: Internal Medicine

## 2018-01-17 ENCOUNTER — Encounter (HOSPITAL_COMMUNITY): Payer: Self-pay

## 2018-01-17 DIAGNOSIS — J0140 Acute pansinusitis, unspecified: Secondary | ICD-10-CM

## 2018-01-17 MED ORDER — PREDNISONE 50 MG PO TABS: 50.0000 mg | ORAL_TABLET | Freq: Every day | ORAL | 0 refills | Status: AC

## 2018-01-17 MED ORDER — PREDNISONE 10 MG PO TABS: 20.0000 mg | ORAL_TABLET | Freq: Every day | ORAL | 0 refills | Status: DC

## 2018-01-17 MED ORDER — AMOXICILLIN-POT CLAVULANATE 875-125 MG PO TABS: 1.0000 | ORAL_TABLET | Freq: Two times a day (BID) | ORAL | 0 refills | Status: DC

## 2018-01-17 MED ORDER — IPRATROPIUM BROMIDE 0.06 % NA SOLN: 2.0000 | Freq: Four times a day (QID) | NASAL | 0 refills | Status: DC

## 2018-01-17 MED ORDER — FLUTICASONE PROPIONATE 50 MCG/ACT NA SUSP: 2.0000 | Freq: Every day | NASAL | 0 refills | Status: DC

## 2018-01-17 MED ORDER — BENZONATATE 100 MG PO CAPS: 100.0000 mg | ORAL_CAPSULE | Freq: Three times a day (TID) | ORAL | 0 refills | Status: DC

## 2018-01-17 NOTE — ED Provider Notes (Signed)
[facility]    CSN: [id number] Arrival date & time: 01/17/18  1641     History   Chief Complaint Chief Complaint  Patient presents with  . Cough    HPI Ferdinand LangoLekia M Hinds is a 27 y.o. female.   27 year old female comes in for 1 week history of URI symptoms.  Has a rhinorrhea, nasal congestion, cough, sore throat.  Has had sinus pressure.  No obvious fever, chills, night sweats.  Has had decreased appetite without abdominal pain, nausea, vomiting.  Has not taken anything for the symptoms.  Former smoker.  Positive sick contact.     Past Medical History:  Diagnosis Date  . Anemia affecting first pregnancy 2013   was taking Iron supplements  . Depression 2013   postpartum depression after first delivery; was prescribed Xanax  . GERD (gastroesophageal reflux disease)    during pregnancy only; takes Tums; helps  . Hypertension     Patient Active Problem List   Diagnosis Date Noted  . GBS (group B Streptococcus carrier), +RV culture, currently pregnant 09/02/2016  . Chronic hypertension affecting pregnancy 08/31/2016  . Insufficient prenatal care 08/28/2016  . Obesity in pregnancy 08/28/2016  . BMI 50.0-59.9, adult (HCC) 08/28/2016  . Supervision of high risk pregnancy, antepartum 02/02/2016  . Chronic hypertension in pregnancy 02/02/2016  . Previous cesarean section complicating pregnancy 02/02/2016    Past Surgical History:  Procedure Laterality Date  . CESAREAN SECTION    . CESAREAN SECTION N/A 09/02/2016   Procedure: CESAREAN SECTION;  Surgeon: Lesly DukesKelly H Leggett, MD;  [facility];  Service: Obstetrics;  Laterality: N/A;    OB History    Gravida  2   Para  2   Term  2   Preterm  0   AB  0   Living  2     SAB  0   TAB  0   Ectopic  0   Multiple  0   Live Births  2            Home Medications    Prior to Admission medications   Medication Sig Start Date End Date Taking? Authorizing Provider    amoxicillin-clavulanate (AUGMENTIN) 875-125 MG tablet Take 1 tablet by mouth every 12 (twelve) hours. 01/17/18   Cathie HoopsYu, Dejion Grillo V, PA-C  benzonatate (TESSALON) 100 MG capsule Take 1 capsule (100 mg total) by mouth every 8 (eight) hours. 01/17/18   Cathie HoopsYu, Melanee Cordial V, PA-C  fluticasone (FLONASE) 50 MCG/ACT nasal spray Place 2 sprays into both nostrils daily. 01/17/18   Cathie HoopsYu, Benino Korinek V, PA-C  ipratropium (ATROVENT) 0.06 % nasal spray Place 2 sprays into both nostrils 4 (four) times daily. 01/17/18   Cathie HoopsYu, Calleigh Lafontant V, PA-C  predniSONE (DELTASONE) 50 MG tablet Take 1 tablet (50 mg total) by mouth daily for 5 days. 01/17/18 01/22/18  Belinda FisherYu, Evy Lutterman V, PA-C    Family History Family History  Problem Relation Age of Onset  . Hypertension Mother     Social History Social History   Tobacco Use  . Smoking status: Former Smoker    Packs/day: 0.50    Types: Cigarettes  . Smokeless tobacco: Current User  Substance Use Topics  . Alcohol use: No    Alcohol/week: 7.0 standard drinks    Types: 7 Cans of beer per week  . Drug use: No     Allergies   Patient has no known allergies.   Review of Systems Review of Systems  Reason unable to perform ROS:  See HPI as above.     Physical Exam Triage Vital Signs ED Triage Vitals  Enc Vitals Group     BP 01/17/18 1730 (!) 151/89     Pulse Rate 01/17/18 1730 81     Resp 01/17/18 1730 19     Temp 01/17/18 1730 99 F (37.2 C)     Temp src --      SpO2 01/17/18 1730 99 %     Weight --      Height --      Head Circumference --      Peak Flow --      Pain Score 01/17/18 1729 8     Pain Loc --      Pain Edu? --      Excl. in GC? --    No data found.  Updated Vital Signs BP (!) 151/89   Pulse 81   Temp 99 F (37.2 C)   Resp 19   LMP 01/17/2018   SpO2 99%   Physical Exam  Constitutional: She is oriented to person, place, and time. She appears well-developed and well-nourished. No distress.  HENT:  Head: Normocephalic and atraumatic.  Right Ear: Tympanic membrane, external  ear and ear canal normal. Tympanic membrane is not erythematous and not bulging.  Left Ear: Tympanic membrane, external ear and ear canal normal. Tympanic membrane is not erythematous and not bulging.  Nose: Right sinus exhibits maxillary sinus tenderness and frontal sinus tenderness. Left sinus exhibits maxillary sinus tenderness and frontal sinus tenderness.  Mouth/Throat: Uvula is midline, oropharynx is clear and moist and mucous membranes are normal.  Eyes: Pupils are equal, round, and reactive to light. Conjunctivae are normal.  Neck: Normal range of motion. Neck supple.  Cardiovascular: Normal rate, regular rhythm and normal heart sounds. Exam reveals no gallop and no friction rub.  No murmur heard. Pulmonary/Chest: Effort normal and breath sounds normal. She has no decreased breath sounds. She has no wheezes. She has no rhonchi. She has no rales.  Lymphadenopathy:    She has no cervical adenopathy.  Neurological: She is alert and oriented to person, place, and time.  Skin: Skin is warm and dry.  Psychiatric: She has a normal mood and affect. Her behavior is normal. Judgment normal.     UC Treatments / Results  Labs (all labs ordered are listed, but only abnormal results are displayed) Labs Reviewed - No data to display  EKG None  Radiology No results found.  Procedures Procedures (including critical care time)  Medications Ordered in UC Medications - No data to display  Initial Impression / Assessment and Plan / UC Course  I have reviewed the triage vital signs and the nursing notes.  Pertinent labs & imaging results that were available during my care of the patient were reviewed by me and considered in my medical decision making (see chart for details).    Start prednisone for sinus pressure.  Other symptomatic treatment discussed.  Rx of Augmentin sent to pharmacy, can start in 3 days if symptoms not improving for sinusitis.  Return precautions given.  Patient  expresses understanding and agrees to plan.  Final Clinical Impressions(s) / UC Diagnoses   Final diagnoses:  Acute non-recurrent pansinusitis    ED Prescriptions    Medication Sig Dispense Auth. Provider   predniSONE (DELTASONE) 10 MG tablet  (Status: Discontinued) Take 2 tablets (20 mg total) by mouth daily. 15 tablet Lilybeth Vien V, PA-C   ipratropium (ATROVENT) 0.06 % nasal spray  Place 2 sprays into both nostrils 4 (four) times daily. 15 mL Jarome Trull V, PA-C   fluticasone (FLONASE) 50 MCG/ACT nasal spray Place 2 sprays into both nostrils daily. 1 g Kelsey Edman V, PA-C   amoxicillin-clavulanate (AUGMENTIN) 875-125 MG tablet Take 1 tablet by mouth every 12 (twelve) hours. 14 tablet Adelis Docter V, PA-C   benzonatate (TESSALON) 100 MG capsule Take 1 capsule (100 mg total) by mouth every 8 (eight) hours. 21 capsule Violia Knopf V, PA-C   predniSONE (DELTASONE) 50 MG tablet Take 1 tablet (50 mg total) by mouth daily for 5 days. 5 tablet Threasa Alpha, [place] 01/17/18 [phone]

## 2018-01-17 NOTE — Discharge Instructions (Signed)
Prednisone for sinus pressure, cough.Tessalon for cough. Start flonase, atrovent nasal spray for nasal congestion/drainage. You can use over the counter nasal saline rinse such as neti pot for nasal congestion. Keep hydrated, your urine should be clear to pale yellow in color. Tylenol/motrin for fever and pain. Monitor for any worsening of symptoms, chest pain, shortness of breath, wheezing, swelling of the throat, follow up for reevaluation.   If symptoms not improving in 3 days, can fill Augmentin for sinus infection.  For sore throat/cough try using a honey-based tea. Use 3 teaspoons of honey with juice squeezed from half lemon. Place shaved pieces of ginger into 1/2-1 cup of water and warm over stove top. Then mix the ingredients and repeat every 4 hours as needed.

## 2018-01-17 NOTE — ED Triage Notes (Signed)
Pt presents with congestion, cough and runny nose.

## 2018-01-31 ENCOUNTER — Telehealth (HOSPITAL_COMMUNITY): Payer: Self-pay

## 2018-03-22 ENCOUNTER — Ambulatory Visit (HOSPITAL_COMMUNITY)
Admission: EM | Admit: 2018-03-22 | Discharge: 2018-03-22 | Disposition: A | Discharge status: Home / Self Care | Payer: Medicaid Other | Attending: Internal Medicine | Admitting: Internal Medicine

## 2018-03-22 ENCOUNTER — Encounter (HOSPITAL_COMMUNITY): Payer: Self-pay | Admitting: *Deleted

## 2018-03-22 ENCOUNTER — Other Ambulatory Visit: Payer: Self-pay

## 2018-03-22 DIAGNOSIS — I10 Essential (primary) hypertension: Secondary | ICD-10-CM | POA: Diagnosis not present

## 2018-03-22 DIAGNOSIS — F17210 Nicotine dependence, cigarettes, uncomplicated: Secondary | ICD-10-CM | POA: Diagnosis not present

## 2018-03-22 DIAGNOSIS — Z8249 Family history of ischemic heart disease and other diseases of the circulatory system: Secondary | ICD-10-CM | POA: Diagnosis not present

## 2018-03-22 DIAGNOSIS — R1032 Left lower quadrant pain: Secondary | ICD-10-CM | POA: Insufficient documentation

## 2018-03-22 DIAGNOSIS — R3911 Hesitancy of micturition: Secondary | ICD-10-CM | POA: Insufficient documentation

## 2018-03-22 DIAGNOSIS — Z79899 Other long term (current) drug therapy: Secondary | ICD-10-CM | POA: Insufficient documentation

## 2018-03-22 DIAGNOSIS — R350 Frequency of micturition: Secondary | ICD-10-CM | POA: Insufficient documentation

## 2018-03-22 DIAGNOSIS — R102 Pelvic and perineal pain: Secondary | ICD-10-CM

## 2018-03-22 LAB — POCT URINALYSIS DIP (DEVICE)
Glucose, UA: NEGATIVE mg/dL
Nitrite: NEGATIVE
PROTEIN: 100 mg/dL — AB
SPECIFIC GRAVITY, URINE: 1.025 (ref 1.005–1.030)
Urobilinogen, UA: 1 mg/dL (ref 0.0–1.0)
pH: 7 (ref 5.0–8.0)

## 2018-03-22 LAB — POCT PREGNANCY, URINE: Preg Test, Ur: NEGATIVE

## 2018-03-22 MED ORDER — SULFAMETHOXAZOLE-TRIMETHOPRIM 800-160 MG PO TABS: 1.0000 | ORAL_TABLET | Freq: Two times a day (BID) | ORAL | 0 refills | Status: DC

## 2018-03-22 NOTE — ED Provider Notes (Signed)
[facility]    CSN: [id number] Arrival date & time: 03/22/18  1358     History   Chief Complaint No chief complaint on file.   HPI Terri Tran is a 27 y.o. female.   Chief complaint of lower left suprapubic pressure, started 3-4 days ago,worsening. Describes urine as cloudy, urinary hesitancy, with urinary frequency.  No dysuria. 'feels like my last uti'  Felt sharp pain and then started to worsen last night . Hot rag to ease pain. If lays on right side, feels better. No injury.  Took peptol bismal without relief.  Last UTI 09/2017.   No fever, n, v.  H/o c/s . No other abomdinal surgeries. No h/o diverticulitis. No CP  A little blood from vagina. No changes to vaginal discharge.  No bleeding from rectum. Last LMP in October.   Like to be tested for STDs and also pregnancy.     Past Medical History:  Diagnosis Date  . Anemia affecting first pregnancy 2013   was taking Iron supplements  . Depression 2013   postpartum depression after first delivery; was prescribed Xanax  . GERD (gastroesophageal reflux disease)    during pregnancy only; takes Tums; helps  . Hypertension     Patient Active Problem List   Diagnosis Date Noted  . GBS (group B Streptococcus carrier), +RV culture, currently pregnant 09/02/2016  . Chronic hypertension affecting pregnancy 08/31/2016  . Insufficient prenatal care 08/28/2016  . Obesity in pregnancy 08/28/2016  . BMI 50.0-59.9, adult (HCC) 08/28/2016  . Supervision of high risk pregnancy, antepartum 02/02/2016  . Chronic hypertension in pregnancy 02/02/2016  . Previous cesarean section complicating pregnancy 02/02/2016    Past Surgical History:  Procedure Laterality Date  . CESAREAN SECTION    . CESAREAN SECTION N/A 09/02/2016   Procedure: CESAREAN SECTION;  Surgeon: Lesly Dukes, MD;  Location: [facility] BIRTHING SUITES;  Service: Obstetrics;  Laterality: N/A;    OB History    Gravida  2   Para  2   Term  2   Preterm  0   AB  0   Living  2     SAB  0   TAB  0   Ectopic  0   Multiple  0   Live Births  2            Home Medications    Prior to Admission medications   Medication Sig Start Date End Date Taking? Authorizing Provider  amoxicillin-clavulanate (AUGMENTIN) 875-125 MG tablet Take 1 tablet by mouth every 12 (twelve) hours. 01/17/18   Cathie Hoops, Amy V, PA-C  benzonatate (TESSALON) 100 MG capsule Take 1 capsule (100 mg total) by mouth every 8 (eight) hours. 01/17/18   Cathie Hoops, Amy V, PA-C  fluticasone (FLONASE) 50 MCG/ACT nasal spray Place 2 sprays into both nostrils daily. 01/17/18   Cathie Hoops, Amy V, PA-C  ipratropium (ATROVENT) 0.06 % nasal spray Place 2 sprays into both nostrils 4 (four) times daily. 01/17/18   Cathie Hoops, Amy V, PA-C  sulfamethoxazole-trimethoprim (BACTRIM DS,SEPTRA DS) 800-160 MG tablet Take 1 tablet by mouth 2 (two) times daily. 03/22/18   Allegra Grana, FNP    Family History Family History  Problem Relation Age of Onset  . Hypertension Mother     Social History Social History   Tobacco Use  . Smoking status: Current Some Day Smoker    Packs/day: 0.50    Types: Cigarettes  . Smokeless tobacco: Current User  Substance Use Topics  .  Alcohol use: No    Alcohol/week: 7.0 standard drinks    Types: 7 Cans of beer per week  . Drug use: No     Allergies   Patient has no known allergies.   Review of Systems Review of Systems  Constitutional: Negative for chills and fever.  Respiratory: Negative for cough.   Cardiovascular: Negative for chest pain and palpitations.  Gastrointestinal: Negative for abdominal distention, nausea and vomiting.  Genitourinary: Positive for difficulty urinating, frequency, pelvic pain and vaginal bleeding. Negative for dysuria, hematuria, vaginal discharge and vaginal pain.     Physical Exam Triage Vital Signs ED Triage Vitals  Enc Vitals Group     BP 03/22/18 1527 (!) 157/77     Pulse Rate 03/22/18 1527 75     Resp 03/22/18  1527 18     Temp 03/22/18 1527 97.7 F (36.5 C)     Temp Source 03/22/18 1527 Oral     SpO2 03/22/18 1527 98 %     Weight --      Height --      Head Circumference --      Peak Flow --      Pain Score 03/22/18 1526 10     Pain Loc --      Pain Edu? --      Excl. in GC? --    No data found.  Updated Vital Signs BP (!) 157/77 (BP Location: Left Arm)   Pulse 75   Temp 97.7 F (36.5 C) (Oral)   Resp 18   LMP 03/03/2018 (Approximate)   SpO2 98%   Visual Acuity Right Eye Distance:   Left Eye Distance:   Bilateral Distance:    Right Eye Near:   Left Eye Near:    Bilateral Near:     Physical Exam  Constitutional: She appears well-developed and well-nourished.  Eyes: Conjunctivae are normal.  Cardiovascular: Normal rate, regular rhythm, normal heart sounds and normal pulses.  Pulmonary/Chest: Effort normal and breath sounds normal. She has no wheezes. She has no rhonchi. She has no rales.  Abdominal: Soft. Normal appearance and bowel sounds are normal. She exhibits no distension, no fluid wave, no ascites and no mass. There is tenderness. There is no rigidity, no rebound, no guarding and no CVA tenderness.    Suprapubic pain with palpation as noted, primarily left sided.   Neurological: She is alert.  Skin: Skin is warm and dry.  Psychiatric: She has a normal mood and affect. Her speech is normal and behavior is normal. Thought content normal.  Vitals reviewed.    UC Treatments / Results  Labs (all labs ordered are listed, but only abnormal results are displayed) Labs Reviewed  POCT URINALYSIS DIP (DEVICE) - Abnormal; Notable for the following components:      Result Value   Bilirubin Urine SMALL (*)    Ketones, ur TRACE (*)    Hgb urine dipstick SMALL (*)    Protein, ur 100 (*)    Leukocytes, UA SMALL (*)    All other components within normal limits  URINE CULTURE  POCT PREGNANCY, URINE  CERVICOVAGINAL ANCILLARY ONLY    EKG None  Radiology No results  found.  Procedures Procedures (including critical care time)  Medications Ordered in UC Medications - No data to display  Initial Impression / Assessment and Plan / UC Course  I have reviewed the triage vital signs and the nursing notes.  Pertinent labs & imaging results that were available during my care of  the patient were reviewed by me and considered in my medical decision making (see chart for details).      Final Clinical Impressions(s) / UC Diagnoses   Final diagnoses:  Suprapubic pain  Patient is well-appearing, afebrile. Pain on exam is not exquisite. No diarrhea. NO h/o diverticulitis.   Urine is positive for small leukocytes.  Negative nitrites . based on history of predominant urine complaints, presentation today, patient and I jointly agree with going to empirically treat for cystitis.  We will start Bactrim based on patient's last urine culture.  Pending self swab for STDs.  Patient politely declined pelvic exam today.  Given strict precautions that if pain does not improve promptly on antibiotic therapy, and certainly if new symptoms present, that she should return to the emergency room for prompt evaluation.   Discharge Instructions     Based on her symptoms, suspect your suprapubic pain is related to a urinary tract infection.  I will go ahead and empirically start antibiotic for you, Bactrim.  Plenty of fluids. We are still waiting results for your vaginal swab, urine culture.  We will call you with these.  If we do not give you a call, please call for results. If any worsening abdominal pain, fever, new symptoms develop, please go to your nearest emergency room.    ED Prescriptions    Medication Sig Dispense Auth. Provider   sulfamethoxazole-trimethoprim (BACTRIM DS,SEPTRA DS) 800-160 MG tablet Take 1 tablet by mouth 2 (two) times daily. 6 tablet Allegra Grana, FNP     Controlled Substance Prescriptions [place] Controlled Substance Registry consulted? Not  Applicable   Allegra Grana, FNP 03/22/18 [phone]

## 2018-03-22 NOTE — Discharge Instructions (Signed)
Based on her symptoms, suspect your suprapubic pain is related to a urinary tract infection.  I will go ahead and empirically start antibiotic for you, Bactrim.  Plenty of fluids. We are still waiting results for your vaginal swab, urine culture.  We will call you with these.  If we do not give you a call, please call for results. If any worsening abdominal pain, fever, new symptoms develop, please go to your nearest emergency room.

## 2018-03-22 NOTE — ED Triage Notes (Signed)
Per pt she is having left lower abd pain for the past 3-4 days, severe last night, per pt she would like check for pregnancy, std and uti

## 2018-03-24 LAB — URINE CULTURE: Culture: >=100000 — AB

## 2018-03-24 LAB — CERVICOVAGINAL ANCILLARY ONLY
Bacterial vaginitis: POSITIVE — AB
CANDIDA VAGINITIS: POSITIVE — AB
Chlamydia: NEGATIVE
Neisseria Gonorrhea: NEGATIVE
TRICH (WINDOWPATH): NEGATIVE

## 2018-03-25 ENCOUNTER — Telehealth (HOSPITAL_COMMUNITY): Payer: Self-pay

## 2018-03-25 MED ORDER — FLUCONAZOLE 150 MG PO TABS: 150.0000 mg | ORAL_TABLET | Freq: Every day | ORAL | 0 refills | Status: AC

## 2018-03-25 MED ORDER — METRONIDAZOLE 500 MG PO TABS: 500.0000 mg | ORAL_TABLET | Freq: Two times a day (BID) | ORAL | 0 refills | Status: DC

## 2018-03-25 NOTE — Telephone Encounter (Signed)
Bacterial vaginosis is positive. This was not treated at the urgent care visit.  Flagyl 500 mg BID x 7 days #14 no refills sent to patients pharmacy of choice.    Test for candida (yeast) was positive.  Prescription for fluconazole 150mg  po now, repeat dose in 3d if needed, #2 no refills, sent to the pharmacy of record.  Recheck or followup with PCP for further evaluation if symptoms are not improving.    Attempted to reach patient. No answer at this time. Voicemail left.  Message sent to mychart.

## 2018-07-14 ENCOUNTER — Encounter (HOSPITAL_COMMUNITY): Payer: Self-pay

## 2018-07-14 ENCOUNTER — Ambulatory Visit (HOSPITAL_COMMUNITY)
Admission: EM | Admit: 2018-07-14 | Discharge: 2018-07-14 | Disposition: A | Discharge status: Home / Self Care | Payer: Medicaid Other | Attending: Family Medicine | Admitting: Family Medicine

## 2018-07-14 DIAGNOSIS — Z3201 Encounter for pregnancy test, result positive: Secondary | ICD-10-CM

## 2018-07-14 LAB — POCT PREGNANCY, URINE: Preg Test, Ur: POSITIVE — AB

## 2018-07-14 NOTE — ED Notes (Signed)
Per provider because of positive pregnancy test [facility]
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
## 2018-07-14 NOTE — ED Triage Notes (Signed)
Per Dr. Milus Glazier, [facility] needs [facility], pt given information for [facility] and sent there.

## 2018-07-29 ENCOUNTER — Ambulatory Visit (INDEPENDENT_AMBULATORY_CARE_PROVIDER_SITE_OTHER): Payer: Medicaid Other | Admitting: Internal Medicine

## 2018-07-29 ENCOUNTER — Ambulatory Visit: Payer: Self-pay

## 2018-07-29 ENCOUNTER — Encounter: Payer: Self-pay | Admitting: Internal Medicine

## 2018-07-29 VITALS — BP 124/80 | HR 77 | Wt 329.4 lb

## 2018-07-29 DIAGNOSIS — O10911 Unspecified pre-existing hypertension complicating pregnancy, first trimester: Secondary | ICD-10-CM

## 2018-07-29 DIAGNOSIS — Z3480 Encounter for supervision of other normal pregnancy, unspecified trimester: Secondary | ICD-10-CM

## 2018-07-29 DIAGNOSIS — O34219 Maternal care for unspecified type scar from previous cesarean delivery: Secondary | ICD-10-CM

## 2018-07-29 DIAGNOSIS — Z3490 Encounter for supervision of normal pregnancy, unspecified, unspecified trimester: Secondary | ICD-10-CM

## 2018-07-29 DIAGNOSIS — O99211 Obesity complicating pregnancy, first trimester: Secondary | ICD-10-CM

## 2018-07-29 DIAGNOSIS — O10919 Unspecified pre-existing hypertension complicating pregnancy, unspecified trimester: Secondary | ICD-10-CM

## 2018-07-29 DIAGNOSIS — O0991 Supervision of high risk pregnancy, unspecified, first trimester: Secondary | ICD-10-CM

## 2018-07-29 DIAGNOSIS — O99210 Obesity complicating pregnancy, unspecified trimester: Secondary | ICD-10-CM

## 2018-07-29 DIAGNOSIS — Z3A12 12 weeks gestation of pregnancy: Secondary | ICD-10-CM | POA: Diagnosis not present

## 2018-07-29 DIAGNOSIS — O0990 Supervision of high risk pregnancy, unspecified, unspecified trimester: Secondary | ICD-10-CM | POA: Insufficient documentation

## 2018-07-29 MED ORDER — ASPIRIN EC 81 MG PO TBEC: 81.0000 mg | DELAYED_RELEASE_TABLET | Freq: Every day | ORAL | 3 refills | Status: DC

## 2018-07-29 NOTE — Progress Notes (Signed)
Subjective:   Terri Tran is a 28 y.o. G3P2002 at [redacted]w[redacted]d by LMP (unsure of exact date) being seen today for her first obstetrical visit.  Her obstetrical history is significant for obesity. Patient does intend to breast feed. Pregnancy history fully reviewed.  Patient reports no complaints.  HISTORY: OB History  Gravida Para Term Preterm AB Living  3 2 2  0 0 2  SAB TAB Ectopic Multiple Live Births  0 0 0 0 2    # Outcome Date GA Lbr Len/2nd Weight Sex Delivery Anes PTL Lv  3 Current           2 Term 09/02/16 [redacted]w[redacted]d  6 lb 13.9 oz (3.115 kg) M CS-LTranv EPI  LIV     Name: Terri Tran, Terri Tran     Apgar1: 9  Apgar5: 9  1 Term 08/08/11 [redacted]w[redacted]d   F CS-LTranv Spinal N LIV   Past Medical History:  Diagnosis Date  . Anemia affecting first pregnancy 2013   was taking Iron supplements  . Depression 2013   postpartum depression after first delivery; was prescribed Xanax  . GERD (gastroesophageal reflux disease)    during pregnancy only; takes Tums; helps  . Hypertension    Past Surgical History:  Procedure Laterality Date  . CESAREAN SECTION    . CESAREAN SECTION N/A 09/02/2016   Procedure: CESAREAN SECTION;  Surgeon: Lesly Dukes, MD;  Location: [facility]IRTHING SUIT[facility];  Service: Obstetrics;  Laterality: N/A;   Family History  Problem Relation Age of Onset  . Hypertension Mother    Social History   Tobacco Use  . Smoking status: Current Some Day Smoker    Packs/day: 0.50    Types: Cigarettes  . Smokeless tobacco: Current User  Substance Use Topics  . Alcohol use: No    Alcohol/week: 7.0 standard drinks    Types: 7 Cans of beer per week  . Drug use: No   No Known Allergies Current Outpatient Medications on File Prior to Visit  Medication Sig Dispense Refill  . amoxicillin-clavulanate (AUGMENTIN) 875-125 MG tablet Take 1 tablet by mouth every 12 (twelve) hours. (Patient not taking: Reported on 07/29/2018) 14 tablet 0  . benzonatate (TESSALON) 100 MG capsule Take 1 capsule  (100 mg total) by mouth every 8 (eight) hours. (Patient not taking: Reported on 07/29/2018) 21 capsule 0  . fluticasone (FLONASE) 50 MCG/ACT nasal spray Place 2 sprays into both nostrils daily. (Patient not taking: Reported on 07/29/2018) 1 g 0  . ipratropium (ATROVENT) 0.06 % nasal spray Place 2 sprays into both nostrils 4 (four) times daily. (Patient not taking: Reported on 07/29/2018) 15 mL 0  . metroNIDAZOLE (FLAGYL) 500 MG tablet Take 1 tablet (500 mg total) by mouth 2 (two) times daily. (Patient not taking: Reported on 07/29/2018) 14 tablet 0  . sulfamethoxazole-trimethoprim (BACTRIM DS,SEPTRA DS) 800-160 MG tablet Take 1 tablet by mouth 2 (two) times daily. (Patient not taking: Reported on 07/29/2018) 6 tablet 0   No current facility-administered medications on file prior to visit.     Indications for ASA therapy (per uptodate) One of the following: Previous pregnancy with preeclampsia, especially early onset and with an adverse outcome No Multifetal gestation No Chronic hypertension Yes Type 1 or 2 diabetes mellitus No Chronic kidney disease No Autoimmune disease (antiphospholipid syndrome, systemic lupus erythematosus) No  Indications for early 1 hour GTT (per uptodate)  BMI >25 (>23 in Asian women) AND one of the following  Gestational diabetes mellitus in a previous  pregnancy No Glycated hemoglobin ?5.7 percent (39 mmol/mol), impaired glucose tolerance, or impaired fasting glucose on previous testing No First-degree relative with diabetes Yes High-risk race/ethnicity (eg, African American, Latino, Native American, [place] American, Pacific Islander) Yes History of cardiovascular disease No Hypertension or on therapy for hypertension Yes High-density lipoprotein cholesterol level <35 mg/dL (1.61 mmol/L) and/or a triglyceride level >250 mg/dL (0.96 mmol/L) No Polycystic ovary syndrome No Physical inactivity Yes Other clinical condition associated with insulin resistance (eg, severe  obesity, acanthosis nigricans) Yes Previous birth of an infant weighing ?4000 g No Previous stillbirth of unknown cause No   Exam   Vitals:   07/29/18 0849  BP: 124/80  Pulse: 77  Weight: (!) 329 lb 6.4 oz (149.4 kg)      Uterus:   Gravid, consistent with dates.   Pelvic Exam: Perineum: no hemorrhoids, normal perineum   Vulva: normal external genitalia, no lesions   Vagina:  normal mucosa, normal discharge   Cervix: no lesions and normal, pap smear done.    Adnexa: normal adnexa and no mass, fullness, tenderness   Bony Pelvis: average  System: General: well-developed, well-nourished female in no acute distress   Breast:  normal appearance, no masses or tenderness   Skin: normal coloration and turgor, no rashes   Neurologic: oriented, normal, negative, normal mood   Extremities: normal strength, tone, and muscle mass, ROM of all joints is normal   HEENT PERRLA, extraocular movement intact and sclera clear, anicteric   Mouth/Teeth mucous membranes moist, pharynx normal without lesions and dental hygiene good   Neck supple and no masses   Cardiovascular: regular rate and rhythm   Respiratory:  no respiratory distress, normal breath sounds   Abdomen: soft, non-tender; bowel sounds normal; no masses,  no organomegaly     Assessment:   Pregnancy: [id number] Patient Active Problem List   Diagnosis Date Noted  . Supervision of high risk pregnancy, antepartum 07/29/2018  . Previous cesarean delivery affecting pregnancy 07/29/2018  . Chronic hypertension affecting pregnancy 08/31/2016  . Obesity in pregnancy 08/28/2016  . BMI 50.0-59.9, adult (HCC) 08/28/2016     Plan:   1. Supervision of high risk pregnancy, antepartum Patient was unable to leave urine specimen. Will need OB urine culture at next visit.   Continue prenatal vitamins.  Declined flu vaccine.  Initial labs drawn.  The nature of [facility] with multiple MDs and other Advanced  Practice Providers was explained to patient; also emphasized that residents, students are part of our team. Routine obstetric precautions reviewed. - Inheritest(R) CF/SMA Panel - Hemoglobinopathy Evaluation - Obstetric Panel, Including HIV - [place] MFM OB COMP + 14 WK; Future - Genetic Screening - US OB LESS THAN 14 WEEKS WITH OB TRANSVAGINAL; Future - Glucose Tolerance, 1 Hour  2. Current pregnancy with uncertain date of last menstrual period with normal prior pregnancy Dating ultrasound performed in clinic due to unsure date of LMP. CRL consistent with [redacted]w[redacted]d GA. Therefore, will keep EDD based on LMP. - US OB Limited; Future  3. Chronic hypertension affecting pregnancy BP currently stable. Not currently on medications. Was on Labetalol in prior pregnancy. Will need PCR at next visit due to inability to provide urine sample today.  - aspirin EC 81 MG tablet; Take 1 tablet (81 mg total) by mouth daily.  Dispense: 30 tablet; Refill: 3 - Comprehensive metabolic panel  4. Obesity in pregnancy Discussed appropriate weight gain during this pregnancy. Early 1 hr GTT performed due to  risk factors.   5. Previous cesarean delivery affecting pregnancy Previous CS x2. Will need repeat CS scheduled at 39 weeks.    Return in about 4 weeks (around 08/26/2018) for high risk OB.   De Hollingshead 1:41 PM 07/29/18

## 2018-08-05 ENCOUNTER — Encounter: Payer: Self-pay | Admitting: *Deleted

## 2018-08-07 LAB — OBSTETRIC PANEL, INCLUDING HIV
ANTIBODY SCREEN: NEGATIVE
BASOS: 0 %
Basophils Absolute: 0 10*3/uL (ref 0.0–0.2)
EOS (ABSOLUTE): 0.2 10*3/uL (ref 0.0–0.4)
Eos: 3 %
HEMATOCRIT: 37.3 % (ref 34.0–46.6)
HIV SCREEN 4TH GENERATION: NONREACTIVE
Hemoglobin: 12.1 g/dL (ref 11.1–15.9)
Hepatitis B Surface Ag: NEGATIVE
IMMATURE GRANS (ABS): 0 10*3/uL (ref 0.0–0.1)
Immature Granulocytes: 0 %
LYMPHS ABS: 1.6 10*3/uL (ref 0.7–3.1)
LYMPHS: 22 %
MCH: 27.5 pg (ref 26.6–33.0)
MCHC: 32.4 g/dL (ref 31.5–35.7)
MCV: 85 fL (ref 79–97)
MONOS ABS: 0.5 10*3/uL (ref 0.1–0.9)
Monocytes: 7 %
Neutrophils Absolute: 5.2 10*3/uL (ref 1.4–7.0)
Neutrophils: 68 %
Platelets: 386 10*3/uL (ref 150–450)
RBC: 4.4 x10E6/uL (ref 3.77–5.28)
RDW: 14.5 % (ref 11.7–15.4)
RPR Ser Ql: NONREACTIVE
Rh Factor: POSITIVE
Rubella Antibodies, IGG: 1.2 index (ref >0.99)
WBC: 7.5 10*3/uL (ref 3.4–10.8)

## 2018-08-07 LAB — HEMOGLOBINOPATHY EVALUATION
Ferritin: 32 ng/mL (ref 15–150)
HGB A: 97.7 % (ref 96.4–98.8)
HGB C: 0 %
HGB S: 0 %
Hgb A2 Quant: 2.3 % (ref 1.8–3.2)
Hgb F Quant: 0 % (ref 0.0–2.0)
Hgb Solubility: NEGATIVE
Hgb Variant: 0 %

## 2018-08-07 LAB — INHERITEST(R) CF/SMA PANEL

## 2018-08-07 LAB — GLUCOSE TOLERANCE, 1 HOUR: GLUCOSE, 1HR PP: 89 mg/dL (ref 65–199)

## 2018-08-11 ENCOUNTER — Encounter: Payer: Self-pay | Admitting: *Deleted

## 2018-08-21 ENCOUNTER — Telehealth: Payer: Self-pay | Admitting: Family Medicine

## 2018-08-21 ENCOUNTER — Telehealth: Payer: Medicaid Other | Admitting: Family

## 2018-08-21 DIAGNOSIS — N390 Urinary tract infection, site not specified: Secondary | ICD-10-CM

## 2018-08-21 DIAGNOSIS — A499 Bacterial infection, unspecified: Secondary | ICD-10-CM | POA: Diagnosis not present

## 2018-08-21 MED ORDER — CEPHALEXIN 500 MG PO CAPS: 500.0000 mg | ORAL_CAPSULE | Freq: Two times a day (BID) | ORAL | 0 refills | Status: DC

## 2018-08-21 NOTE — Progress Notes (Signed)

## 2018-08-21 NOTE — Telephone Encounter (Signed)
The patient called in upset about the appointment being scheduled. Explained to the patient our office will be closed on the day she was scheduled however we are working on rescheduling the patients. The patient stated she would like to know what she is having and she does not see any records in [facility]. Informed I would send a message to the nurse.

## 2018-08-21 NOTE — Telephone Encounter (Signed)
Per chart see that horizon and panorama ordered but only horizon tubes sent.   Called patient and left message I am returning her call - please call the office to discuss her call- not urgent. When she calls back need to tell her to come in for redraw for panorama tubes and then will know the sex and remainder genetic testing results.

## 2018-08-25 NOTE — Telephone Encounter (Signed)
Returned pt call.  The home number, a man answered and stated the pt was not available.  Left message with him requesting she call the clinic.  Mobile number, the pt did not pick up.  Left voicemail informing pt that I was returning her call and requesting that she contacts the office if she has any further questions or concerns.

## 2018-08-26 ENCOUNTER — Encounter: Payer: Self-pay | Admitting: *Deleted

## 2018-08-26 ENCOUNTER — Encounter: Payer: Self-pay | Admitting: Medical

## 2018-08-27 ENCOUNTER — Telehealth: Payer: Self-pay

## 2018-08-27 NOTE — Telephone Encounter (Signed)
Pt called requesting her baby fetal gender.   LM that I am returning her call that if she continues to have questions to please give the office a call.

## 2018-09-03 NOTE — Telephone Encounter (Signed)
Attempted to call pt about positive screening for SMA.  Recommendation from the provider that the FOB get tested as well.  Re: Pt needs to be informed that FOB can be tested by [facility] by sending a text with the word "SESSION" on their smart phone to # [phone].  Terri Tran will then reach out them to schedule genetic counseling on pt's availability.

## 2018-09-05 ENCOUNTER — Telehealth: Payer: Medicaid Other | Admitting: Family

## 2018-09-05 DIAGNOSIS — M545 Low back pain, unspecified: Secondary | ICD-10-CM

## 2018-09-05 NOTE — Telephone Encounter (Signed)
LVM advising pt that we were calling her back for results and to call us back if she still needed anything

## 2018-09-05 NOTE — Progress Notes (Signed)
Based on what you shared with me, I feel your condition warrants further evaluation and I recommend that you be seen for a face to face office visit.     NOTE: If you entered your credit card information for this eVisit, you will not be charged. You may see a "hold" on your card for the $35 but that hold will drop off and you will not have a charge processed.  If you are having a true medical emergency please call 911.  If you need an urgent face to face visit, [place] has four urgent care centers for your convenience.    PLEASE NOTE: THE [facility] LOCATIONS AND URGENT CARE CLINICS DO NOT HAVE THE TESTING FOR CORONAVIRUS COVID19 AVAILABLE.  IF YOU FEEL YOU NEED THIS TEST YOU MUST GO TO A TRIAGE LOCATION AT ONE OF THE HOSPITAL EMERGENCY DEPARTMENTS   [email or web address] to reserve your spot online an avoid wait times  [facility] [address], [address] [place], [place] [address] Modified hours of operation: Monday-Friday, 10 AM to 6 PM  Saturday & Sunday 10 AM to 4 PM *Across the street from [employer]  [facility] (New Address!) [address], [address] [place], [place] [address] *Just off [address], across the road from [employer]* Modified hours of operation: Monday-Friday, 10 AM to 5 PM  Closed Saturday & Sunday   The following sites will take your insurance:  . [facility]  [phone] Get Driving Directions Find a Provider at this Location  [address] [place], [place] [address] . 10 am to 8 pm Monday-Friday . 12 pm to 8 pm Saturday-Sunday   . [facility]  [phone] Get Driving Directions Find a Provider at this Location  [address], [address] , [place] [address] . 8 am to 8 pm Monday-Friday . 9 am to 6 pm Saturday . 11 am to 6 pm Sunday   . [facility] [place]  [phone] Get Driving Directions  [address] [place], [place] [address] . 8 am to 8 pm Monday-Friday . 8 am to 4 pm Saturday-Sunday   Your e-visit answers were reviewed by a board certified advanced clinical practitioner to complete your personal care plan.  Thank you for using e-Visits. 

## 2018-09-08 ENCOUNTER — Ambulatory Visit (INDEPENDENT_AMBULATORY_CARE_PROVIDER_SITE_OTHER): Payer: Medicaid Other | Admitting: Family Medicine

## 2018-09-08 ENCOUNTER — Other Ambulatory Visit: Payer: Self-pay

## 2018-09-08 VITALS — BP 122/76 | HR 76 | Temp 97.6°F | Wt 335.0 lb

## 2018-09-08 DIAGNOSIS — O10919 Unspecified pre-existing hypertension complicating pregnancy, unspecified trimester: Secondary | ICD-10-CM

## 2018-09-08 DIAGNOSIS — O99210 Obesity complicating pregnancy, unspecified trimester: Secondary | ICD-10-CM

## 2018-09-08 DIAGNOSIS — Z3A18 18 weeks gestation of pregnancy: Secondary | ICD-10-CM

## 2018-09-08 DIAGNOSIS — O99212 Obesity complicating pregnancy, second trimester: Secondary | ICD-10-CM

## 2018-09-08 DIAGNOSIS — O34219 Maternal care for unspecified type scar from previous cesarean delivery: Secondary | ICD-10-CM

## 2018-09-08 DIAGNOSIS — O10912 Unspecified pre-existing hypertension complicating pregnancy, second trimester: Secondary | ICD-10-CM

## 2018-09-08 DIAGNOSIS — Z3480 Encounter for supervision of other normal pregnancy, unspecified trimester: Secondary | ICD-10-CM

## 2018-09-08 NOTE — Progress Notes (Signed)
   PRENATAL VISIT NOTE  Subjective:  Terri Tran is a 28 y.o. G3P2002 at [redacted]w[redacted]d being seen today for ongoing prenatal care.  She is currently monitored for the following issues for this high-risk pregnancy and has Obesity in pregnancy; BMI 50.0-59.9, adult (HCC); Chronic hypertension affecting pregnancy; Supervision of high risk pregnancy, antepartum; and Previous cesarean delivery affecting pregnancy on their problem list.  Patient reports no complaints.  Contractions: Not present. Vag. Bleeding: None.  Movement: Present. Denies leaking of fluid.   The following portions of the patient's history were reviewed and updated as appropriate: allergies, current medications, past family history, past medical history, past social history, past surgical history and problem list.   Objective:   Vitals:   09/08/18 1443  BP: 122/76  Pulse: 76  Temp: 97.6 F (36.4 C)  Weight: (!) 335 lb (152 kg)    Fetal Status: Fetal Heart Rate (bpm): 156   Movement: Present     General:  Alert, oriented and cooperative. Patient is in no acute distress.  Skin: Skin is warm and dry. No rash noted.   Cardiovascular: Normal heart rate noted  Respiratory: Normal respiratory effort, no problems with respiration noted  Abdomen: Soft, gravid, appropriate for gestational age.  Pain/Pressure: Absent     Pelvic: Cervical exam deferred        Extremities: Normal range of motion.  Edema: None  Mental Status: Normal mood and affect. Normal behavior. Normal judgment and thought content.   Assessment and Plan:  Pregnancy: G3P2002 at [redacted]w[redacted]d  1. Supervision of other normal pregnancy, antepartum FHT normal - Culture, OB Urine - Comprehensive metabolic panel - CHL AMB BABYSCRIPTS SCHEDULE OPTIMIZATION  2. Chronic hypertension affecting pregnancy BP normal Take ASA 81mg  daily Serial [place] CMP and UP:C  3. Obesity in pregnancy   4. Previous cesarean delivery affecting pregnancy    Preterm labor symptoms and  general obstetric precautions including but not limited to vaginal bleeding, contractions, leaking of fluid and fetal movement were reviewed in detail with the patient. Please refer to After Visit Summary for other counseling recommendations.   No follow-ups on file.  Future Appointments  Date Time Provider Department Center  09/18/2018  1:15 PM [facility] [place] 4 [facility]    Levie Heritage, DO

## 2018-09-09 LAB — COMPREHENSIVE METABOLIC PANEL
ALT: 7 IU/L (ref 0–32)
AST: 8 IU/L (ref 0–40)
Albumin/Globulin Ratio: 1.5 (ref 1.2–2.2)
Albumin: 3.5 g/dL — ABNORMAL LOW (ref 3.9–5.0)
Alkaline Phosphatase: 39 IU/L (ref 39–117)
BUN/Creatinine Ratio: 13 (ref 9–23)
BUN: 8 mg/dL (ref 6–20)
Bilirubin Total: <0.2 mg/dL (ref 0.0–1.2)
CO2: 19 mmol/L — ABNORMAL LOW (ref 20–29)
Calcium: 9.1 mg/dL (ref 8.7–10.2)
Chloride: 102 mmol/L (ref 96–106)
Creatinine, Ser: 0.63 mg/dL (ref 0.57–1.00)
Globulin, Total: 2.3 g/dL (ref 1.5–4.5)
Glucose: 83 mg/dL (ref 65–99)
Potassium: 4.1 mmol/L (ref 3.5–5.2)
Sodium: 137 mmol/L (ref 134–144)
Total Protein: 5.8 g/dL — ABNORMAL LOW (ref 6.0–8.5)

## 2018-09-13 ENCOUNTER — Encounter: Payer: Self-pay | Admitting: Family Medicine

## 2018-09-13 DIAGNOSIS — R8271 Bacteriuria: Secondary | ICD-10-CM

## 2018-09-13 HISTORY — DX: Bacteriuria: R82.71

## 2018-09-13 LAB — URINE CULTURE, OB REFLEX

## 2018-09-13 LAB — CULTURE, OB URINE

## 2018-09-16 ENCOUNTER — Other Ambulatory Visit: Payer: Self-pay | Admitting: Family Medicine

## 2018-09-16 MED ORDER — AMOXICILLIN 875 MG PO TABS: 875.0000 mg | ORAL_TABLET | Freq: Two times a day (BID) | ORAL | 0 refills | Status: AC

## 2018-09-18 ENCOUNTER — Other Ambulatory Visit: Payer: Self-pay

## 2018-09-18 ENCOUNTER — Ambulatory Visit (HOSPITAL_COMMUNITY)
Admission: RE | Admit: 2018-09-18 | Discharge: 2018-09-18 | Disposition: A | Discharge status: Home / Self Care | Payer: Medicaid Other | Source: Ambulatory Visit | Attending: Obstetrics and Gynecology | Admitting: Obstetrics and Gynecology

## 2018-09-18 ENCOUNTER — Other Ambulatory Visit: Payer: Self-pay | Admitting: Internal Medicine

## 2018-09-18 DIAGNOSIS — Z3480 Encounter for supervision of other normal pregnancy, unspecified trimester: Secondary | ICD-10-CM

## 2018-09-18 DIAGNOSIS — O10012 Pre-existing essential hypertension complicating pregnancy, second trimester: Secondary | ICD-10-CM | POA: Diagnosis not present

## 2018-09-18 DIAGNOSIS — O34219 Maternal care for unspecified type scar from previous cesarean delivery: Secondary | ICD-10-CM | POA: Diagnosis not present

## 2018-09-18 DIAGNOSIS — Z363 Encounter for antenatal screening for malformations: Secondary | ICD-10-CM

## 2018-09-18 DIAGNOSIS — O99212 Obesity complicating pregnancy, second trimester: Secondary | ICD-10-CM | POA: Diagnosis not present

## 2018-09-18 DIAGNOSIS — Z3A19 19 weeks gestation of pregnancy: Secondary | ICD-10-CM

## 2018-09-19 ENCOUNTER — Other Ambulatory Visit (HOSPITAL_COMMUNITY): Payer: Self-pay | Admitting: *Deleted

## 2018-09-19 DIAGNOSIS — O10912 Unspecified pre-existing hypertension complicating pregnancy, second trimester: Secondary | ICD-10-CM

## 2018-09-24 ENCOUNTER — Ambulatory Visit (INDEPENDENT_AMBULATORY_CARE_PROVIDER_SITE_OTHER): Payer: Medicaid Other

## 2018-09-24 DIAGNOSIS — O34219 Maternal care for unspecified type scar from previous cesarean delivery: Secondary | ICD-10-CM

## 2018-09-24 DIAGNOSIS — O10919 Unspecified pre-existing hypertension complicating pregnancy, unspecified trimester: Secondary | ICD-10-CM

## 2018-09-24 DIAGNOSIS — Z3A20 20 weeks gestation of pregnancy: Secondary | ICD-10-CM

## 2018-09-24 DIAGNOSIS — R898 Other abnormal findings in specimens from other organs, systems and tissues: Secondary | ICD-10-CM

## 2018-09-24 DIAGNOSIS — R8271 Bacteriuria: Secondary | ICD-10-CM

## 2018-09-24 DIAGNOSIS — O10912 Unspecified pre-existing hypertension complicating pregnancy, second trimester: Secondary | ICD-10-CM

## 2018-09-24 DIAGNOSIS — Z3480 Encounter for supervision of other normal pregnancy, unspecified trimester: Secondary | ICD-10-CM

## 2018-09-24 DIAGNOSIS — O0990 Supervision of high risk pregnancy, unspecified, unspecified trimester: Secondary | ICD-10-CM

## 2018-09-24 MED ORDER — AMBULATORY NON FORMULARY MEDICATION: 1.0000 | 0 refills | Status: DC

## 2018-09-24 NOTE — Progress Notes (Signed)
   TELEHEALTH VIRTUAL OBSTETRICS VISIT ENCOUNTER NOTE  I connected with Terri Tran on 09/24/18 at  9:15 AM EDT by WebEx at home and verified that I am speaking with the correct person using two identifiers.   I discussed the limitations, risks, security and privacy concerns of performing an evaluation and management service by telephone and the availability of in person appointments. I also discussed with the patient that there may be a patient responsible charge related to this service. The patient expressed understanding and agreed to proceed.  Subjective:  Terri Tran is a 28 y.o. G3P2002 at [redacted]w[redacted]d being followed for ongoing prenatal care.  She is currently monitored for the following issues for this high-risk pregnancy and has Obesity in pregnancy; BMI 50.0-59.9, adult (HCC); Chronic hypertension affecting pregnancy; Supervision of high risk pregnancy, antepartum; Previous cesarean delivery affecting pregnancy; and GBS bacteriuria on their problem list.  Patient reports no complaints. Reports fetal movement. Denies any contractions, bleeding or leaking of fluid.   The following portions of the patient's history were reviewed and updated as appropriate: allergies, current medications, past family history, past medical history, past social history, past surgical history and problem list.   Objective:   General:  Alert, oriented and cooperative.   Mental Status: Normal mood and affect perceived. Normal judgment and thought content.  Rest of physical exam deferred due to type of encounter  Assessment and Plan:  Pregnancy: G3P2002 at [redacted]w[redacted]d 1. Supervision of other normal pregnancy, antepartum -Patient having issues with BP cuff. Attempted to take while on WebEx and cuff begins to inflate but shows error message. Will send RX for new cuff. Instructed patient to call office if cuff does not work and will have patient come in for nurse visit/BP check  2. Chronic hypertension affecting  pregnancy  3. Previous cesarean delivery affecting pregnancy -Will schedule next appointment with MD for repeat c/s consent  4. GBS bacteriuria -Patient reports completing prescribed antibiotics.  Preterm labor symptoms and general obstetric precautions including but not limited to vaginal bleeding, contractions, leaking of fluid and fetal movement were reviewed in detail with the patient.  I discussed the assessment and treatment plan with the patient. The patient was provided an opportunity to ask questions and all were answered. The patient agreed with the plan and demonstrated an understanding of the instructions. The patient was advised to call back or seek an in-person office evaluation/go to MAU at [facility] for any urgent or concerning symptoms. Please refer to After Visit Summary for other counseling recommendations.   I provided 15 minutes of non-face-to-face time during this encounter.  Return in about 8 weeks (around 11/19/2018) for Return OB visit, 2hr GTT and labs.  Future Appointments  Date Time Provider Department Center  10/16/2018 11:00 AM WH-MFC [place] 3 WH-MFCUS MFC-US    Rolm Bookbinder, CNM [facility] [employer], [facility]

## 2018-09-24 NOTE — Addendum Note (Signed)
Addended by: Gwendlyn Deutscher A on: 09/24/2018 11:00 AM   Modules accepted: Orders

## 2018-10-01 DIAGNOSIS — R898 Other abnormal findings in specimens from other organs, systems and tissues: Secondary | ICD-10-CM

## 2018-10-01 HISTORY — DX: Other abnormal findings in specimens from other organs, systems and tissues: R89.8

## 2018-10-16 ENCOUNTER — Ambulatory Visit (HOSPITAL_COMMUNITY): Payer: Medicaid Other

## 2018-10-23 ENCOUNTER — Other Ambulatory Visit: Payer: Self-pay

## 2018-10-23 ENCOUNTER — Ambulatory Visit (HOSPITAL_COMMUNITY)
Admission: RE | Admit: 2018-10-23 | Discharge: 2018-10-23 | Disposition: A | Discharge status: Home / Self Care | Payer: Medicaid Other | Source: Ambulatory Visit | Attending: Maternal & Fetal Medicine | Admitting: Maternal & Fetal Medicine

## 2018-10-23 ENCOUNTER — Other Ambulatory Visit (HOSPITAL_COMMUNITY): Payer: Self-pay | Admitting: *Deleted

## 2018-10-23 DIAGNOSIS — O10012 Pre-existing essential hypertension complicating pregnancy, second trimester: Secondary | ICD-10-CM | POA: Diagnosis not present

## 2018-10-23 DIAGNOSIS — O10912 Unspecified pre-existing hypertension complicating pregnancy, second trimester: Secondary | ICD-10-CM | POA: Diagnosis not present

## 2018-10-23 DIAGNOSIS — O99210 Obesity complicating pregnancy, unspecified trimester: Secondary | ICD-10-CM

## 2018-10-23 DIAGNOSIS — O99212 Obesity complicating pregnancy, second trimester: Secondary | ICD-10-CM

## 2018-10-23 DIAGNOSIS — O34219 Maternal care for unspecified type scar from previous cesarean delivery: Secondary | ICD-10-CM | POA: Diagnosis not present

## 2018-10-23 DIAGNOSIS — Z3A24 24 weeks gestation of pregnancy: Secondary | ICD-10-CM | POA: Diagnosis not present

## 2018-11-07 NOTE — Progress Notes (Signed)
Greater than 5 minutes, yet less than 10 minutes of time have been spent researching, coordinating, and implementing care for this patient today.  Thank you for the details you included in the comment boxes. Those details are very helpful in determining the best course of treatment for you and help us to provide the best care.  

## 2018-11-11 ENCOUNTER — Telehealth (INDEPENDENT_AMBULATORY_CARE_PROVIDER_SITE_OTHER): Payer: Medicaid Other | Admitting: *Deleted

## 2018-11-11 DIAGNOSIS — O0990 Supervision of high risk pregnancy, unspecified, unspecified trimester: Secondary | ICD-10-CM

## 2018-11-11 NOTE — Telephone Encounter (Signed)
Called pt regarding babyscripts.  Per review, pt last logged a bp in the app on 5/12.  Pt states she is taking her blood pressure but has been writing it down instead of using the app because the app was slow.  Thanked pt for checking her blood pressure weekly.  Advised pt that we encourage the use of the app because it sends alerts to the clinic if any of the numbers are out of range.  Pt verbalized understanding and stated she would enter in her blood pressure once she got home.

## 2018-11-17 ENCOUNTER — Other Ambulatory Visit: Payer: Self-pay | Admitting: *Deleted

## 2018-11-17 ENCOUNTER — Telehealth: Payer: Self-pay | Admitting: Obstetrics and Gynecology

## 2018-11-17 DIAGNOSIS — O0990 Supervision of high risk pregnancy, unspecified, unspecified trimester: Secondary | ICD-10-CM

## 2018-11-17 NOTE — Telephone Encounter (Signed)
Patient was called and instructed about her visit for 11/18/2018. Patient was screened for COVID-19 symptoms and denied any symptoms. Instructed to use the provided hand sanitizer when entering building. Patient was instructed about wearing a mask for their entire visit, and no visitors.  °

## 2018-11-18 ENCOUNTER — Other Ambulatory Visit: Payer: Self-pay

## 2018-11-18 ENCOUNTER — Ambulatory Visit (INDEPENDENT_AMBULATORY_CARE_PROVIDER_SITE_OTHER): Payer: Medicaid Other | Admitting: Obstetrics and Gynecology

## 2018-11-18 ENCOUNTER — Encounter: Payer: Self-pay | Admitting: Obstetrics and Gynecology

## 2018-11-18 ENCOUNTER — Other Ambulatory Visit: Payer: Medicaid Other

## 2018-11-18 VITALS — BP 131/69 | HR 91 | Wt 335.0 lb

## 2018-11-18 DIAGNOSIS — O10919 Unspecified pre-existing hypertension complicating pregnancy, unspecified trimester: Secondary | ICD-10-CM

## 2018-11-18 DIAGNOSIS — Z23 Encounter for immunization: Secondary | ICD-10-CM

## 2018-11-18 DIAGNOSIS — Z6841 Body Mass Index (BMI) 40.0 and over, adult: Secondary | ICD-10-CM | POA: Diagnosis not present

## 2018-11-18 DIAGNOSIS — R8271 Bacteriuria: Secondary | ICD-10-CM

## 2018-11-18 DIAGNOSIS — O34219 Maternal care for unspecified type scar from previous cesarean delivery: Secondary | ICD-10-CM | POA: Diagnosis not present

## 2018-11-18 DIAGNOSIS — O0993 Supervision of high risk pregnancy, unspecified, third trimester: Secondary | ICD-10-CM

## 2018-11-18 DIAGNOSIS — O10913 Unspecified pre-existing hypertension complicating pregnancy, third trimester: Secondary | ICD-10-CM | POA: Diagnosis not present

## 2018-11-18 DIAGNOSIS — O0990 Supervision of high risk pregnancy, unspecified, unspecified trimester: Secondary | ICD-10-CM

## 2018-11-18 NOTE — Progress Notes (Signed)
   PRENATAL VISIT NOTE  Subjective:  Terri Tran is a 28 y.o. G3P2002 at [redacted]w[redacted]d being seen today for ongoing prenatal care.  She is currently monitored for the following issues for this high-risk pregnancy and has Obesity in pregnancy; BMI 50.0-59.9, adult ([place]); Chronic hypertension affecting pregnancy; Supervision of high risk pregnancy, antepartum; Previous cesarean delivery affecting pregnancy; GBS bacteriuria; and Abnormal genetic test on their problem list.  Patient reports no complaints.  Contractions: Not present. Vag. Bleeding: None.  Movement: Present. Denies leaking of fluid.   The following portions of the patient's history were reviewed and updated as appropriate: allergies, current medications, past family history, past medical history, past social history, past surgical history and problem list.   Objective:   Vitals:   11/18/18 0851  BP: 131/69  Pulse: 91  Weight: (!) 335 lb (152 kg)    Fetal Status: Fetal Heart Rate (bpm): 140   Movement: Present     General:  Alert, oriented and cooperative. Patient is in no acute distress.  Skin: Skin is warm and dry. No rash noted.   Cardiovascular: Normal heart rate noted  Respiratory: Normal respiratory effort, no problems with respiration noted  Abdomen: Soft, gravid, appropriate for gestational age.  Pain/Pressure: Present     Pelvic: Cervical exam deferred        Extremities: Normal range of motion.  Edema: Trace  Mental Status: Normal mood and affect. Normal behavior. Normal judgment and thought content.   Assessment and Plan:  Pregnancy: G3P2002 at [redacted]w[redacted]d  1. Supervision of high risk pregnancy, antepartum - Tdap vaccine greater than or equal to 7yo IM - 2 hr GTT tomorrow  2. Previous cesarean delivery affecting pregnancy - H/o x2, expressed interest in TOLAC, however after discussion, she would like to proceed with RCS - RCS scheduling request sent   3. Chronic hypertension affecting pregnancy Cont baby ASA  Stable no meds  4. GBS bacteriuria ppx in labor  5. BMI 50.0-59.9, adult ([place]) Cont baby ASA  Preterm labor symptoms and general obstetric precautions including but not limited to vaginal bleeding, contractions, leaking of fluid and fetal movement were reviewed in detail with the patient. Please refer to After Visit Summary for other counseling recommendations.   Return in about 2 weeks (around 12/02/2018) for OB visit (MD), virtual.  Future Appointments  Date Time Provider [place]  11/19/2018  8:20 AM WOC-WOCA LAB WOC-WOCA [place]  11/20/2018 10:30 AM [place] NURSE [place] MFC-US  11/20/2018 10:30 AM [place] 1 WH-MFCUS MFC-US    Sloan Leiter, MD

## 2018-11-19 ENCOUNTER — Other Ambulatory Visit: Payer: Medicaid Other

## 2018-11-20 ENCOUNTER — Ambulatory Visit (HOSPITAL_COMMUNITY)
Admission: RE | Admit: 2018-11-20 | Discharge: 2018-11-20 | Disposition: A | Discharge status: Home / Self Care | Payer: Medicaid Other | Source: Ambulatory Visit | Attending: Maternal & Fetal Medicine | Admitting: Maternal & Fetal Medicine

## 2018-11-20 ENCOUNTER — Telehealth: Payer: Self-pay | Admitting: General Practice

## 2018-11-20 ENCOUNTER — Ambulatory Visit (HOSPITAL_COMMUNITY): Payer: Medicaid Other

## 2018-11-20 NOTE — Telephone Encounter (Signed)
Received alert from Babyscripts, patient had elevated blood pressure of 130/93. Attempted to call patient, no answer. Discussed with Dr Rip Harbour who states patient can continue to monitor at home and should contact us if she becomes symptomatic. Will send mychart message.

## 2018-11-28 ENCOUNTER — Other Ambulatory Visit: Payer: Self-pay | Admitting: Family Medicine

## 2018-12-03 ENCOUNTER — Other Ambulatory Visit (HOSPITAL_COMMUNITY): Payer: Self-pay | Admitting: *Deleted

## 2018-12-03 ENCOUNTER — Ambulatory Visit (HOSPITAL_COMMUNITY)
Admission: RE | Admit: 2018-12-03 | Discharge: 2018-12-03 | Disposition: A | Discharge status: Home / Self Care | Payer: Medicaid Other | Source: Ambulatory Visit | Attending: Maternal & Fetal Medicine | Admitting: Maternal & Fetal Medicine

## 2018-12-03 ENCOUNTER — Encounter (HOSPITAL_COMMUNITY): Payer: Self-pay

## 2018-12-03 ENCOUNTER — Ambulatory Visit (HOSPITAL_COMMUNITY): Payer: Medicaid Other | Admitting: *Deleted

## 2018-12-03 ENCOUNTER — Other Ambulatory Visit: Payer: Self-pay

## 2018-12-03 VITALS — BP 127/68 | HR 78 | Temp 98.9°F

## 2018-12-03 DIAGNOSIS — O99210 Obesity complicating pregnancy, unspecified trimester: Secondary | ICD-10-CM | POA: Diagnosis present

## 2018-12-03 DIAGNOSIS — O34219 Maternal care for unspecified type scar from previous cesarean delivery: Secondary | ICD-10-CM | POA: Diagnosis not present

## 2018-12-03 DIAGNOSIS — Z3A29 29 weeks gestation of pregnancy: Secondary | ICD-10-CM

## 2018-12-03 DIAGNOSIS — Z362 Encounter for other antenatal screening follow-up: Secondary | ICD-10-CM

## 2018-12-03 DIAGNOSIS — O10013 Pre-existing essential hypertension complicating pregnancy, third trimester: Secondary | ICD-10-CM

## 2018-12-03 DIAGNOSIS — O99213 Obesity complicating pregnancy, third trimester: Secondary | ICD-10-CM | POA: Diagnosis not present

## 2018-12-03 DIAGNOSIS — O10919 Unspecified pre-existing hypertension complicating pregnancy, unspecified trimester: Secondary | ICD-10-CM

## 2018-12-04 ENCOUNTER — Encounter: Payer: Medicaid Other | Admitting: Obstetrics & Gynecology

## 2018-12-04 DIAGNOSIS — O0990 Supervision of high risk pregnancy, unspecified, unspecified trimester: Secondary | ICD-10-CM

## 2018-12-04 NOTE — Progress Notes (Signed)
@  1108 LVM that I will call back at appointment time and to be available by phone.   @1115am  LVM to call office back for appointment and that I will call her 1 more time for appointment. If we don't reach her we will  Have her call and reschedule.  @1129am  LVM to call to reschedule appointment

## 2018-12-09 ENCOUNTER — Telehealth: Payer: Self-pay | Admitting: Obstetrics & Gynecology

## 2018-12-09 NOTE — Telephone Encounter (Signed)
Spoke with patient about her appointment on 7/22 @ 11:15. Patient instructed that the appointment is a mychart visit. Patient instructed to download the mychart app if not already done so. Patient downloaded the app while we were on the phone and knows how to access the appointment.

## 2018-12-10 ENCOUNTER — Telehealth (INDEPENDENT_AMBULATORY_CARE_PROVIDER_SITE_OTHER): Payer: Medicaid Other | Admitting: Obstetrics & Gynecology

## 2018-12-10 ENCOUNTER — Other Ambulatory Visit: Payer: Self-pay

## 2018-12-10 VITALS — BP 124/80 | HR 89

## 2018-12-10 DIAGNOSIS — Z3A31 31 weeks gestation of pregnancy: Secondary | ICD-10-CM

## 2018-12-10 DIAGNOSIS — Z6841 Body Mass Index (BMI) 40.0 and over, adult: Secondary | ICD-10-CM

## 2018-12-10 DIAGNOSIS — O0993 Supervision of high risk pregnancy, unspecified, third trimester: Secondary | ICD-10-CM

## 2018-12-10 DIAGNOSIS — O34219 Maternal care for unspecified type scar from previous cesarean delivery: Secondary | ICD-10-CM

## 2018-12-10 DIAGNOSIS — O0990 Supervision of high risk pregnancy, unspecified, unspecified trimester: Secondary | ICD-10-CM

## 2018-12-10 DIAGNOSIS — O10913 Unspecified pre-existing hypertension complicating pregnancy, third trimester: Secondary | ICD-10-CM

## 2018-12-10 DIAGNOSIS — R898 Other abnormal findings in specimens from other organs, systems and tissues: Secondary | ICD-10-CM

## 2018-12-10 DIAGNOSIS — O10919 Unspecified pre-existing hypertension complicating pregnancy, unspecified trimester: Secondary | ICD-10-CM

## 2018-12-10 NOTE — Progress Notes (Signed)
TELEHEALTH OBSTETRICS PRENATAL VIRTUAL VIDEO VISIT ENCOUNTER NOTE  Provider location: [facility] [place] [facility]   I connected with Terri Tran on 12/10/18 at 11:15 AM EDT by MyChart Video Encounter at home and verified that I am speaking with the correct person using two identifiers.   I discussed the limitations, risks, security and privacy concerns of performing an evaluation and management service virtually and the availability of in person appointments. I also discussed with the patient that there may be a patient responsible charge related to this service. The patient expressed understanding and agreed to proceed. Subjective:  Terri Tran is a 28 y.o. G3P2002 at [redacted]w[redacted]d being seen today for ongoing prenatal care.  She is currently monitored for the following issues for this high-risk pregnancy and has Obesity in pregnancy; BMI 50.0-59.9, adult ([place]); Chronic hypertension affecting pregnancy; Supervision of high risk pregnancy, antepartum; Previous cesarean delivery affecting pregnancy; GBS bacteriuria; and Abnormal genetic test on their problem list.  Patient reports no complaints.  Contractions: Not present. Vag. Bleeding: None.  Movement: Present. Denies any leaking of fluid.   The following portions of the patient's history were reviewed and updated as appropriate: allergies, current medications, past family history, past medical history, past social history, past surgical history and problem list.   Objective:   Vitals:   12/10/18 1112  BP: 124/80  Pulse: 89    Fetal Status:     Movement: Present     General:  Alert, oriented and cooperative. Patient is in no acute distress.  Respiratory: Normal respiratory effort, no problems with respiration noted  Mental Status: Normal mood and affect. Normal behavior. Normal judgment and thought content.  Rest of physical exam deferred due to type of encounter  Imaging: [place] Mfm Ob Follow Up  Result Date:  12/03/2018 ----------------------------------------------------------------------  OBSTETRICS REPORT                         (Corrected Final 12/03/2018 03:16                                                                            pm) ---------------------------------------------------------------------- Patient Info  ID #:       [id number]                          D.O.B.:  March 17, 1991 (28 yrs)  Name:       Terri Tran                  Visit Date: 12/03/2018 09:25 am ---------------------------------------------------------------------- Performed By  Performed By:     Ellin Saba          Ref. Address:      [address]  [address]  Attending:        Noralee Spaceavi Shankar MD        [facility]  [facility]:      [facility] ---------------------------------------------------------------------- Orders   #  Description                           Code        Ordered By   1  US MFM OB FOLLOW UP                   [id number]    Lin LandsmanORENTHIAN                                                        BOOKER  ----------------------------------------------------------------------   #  Order #                     Accession #                Episode #   1  [id number]                   [id number]                 [id number]  ---------------------------------------------------------------------- Indications   [redacted] weeks gestation of pregnancy                 Z3A.29   Hypertension - Chronic/Pre-existing             O10.019   Obesity complicating pregnancy, second          O99.212   trimester   Previous cesarean delivery, antepartum x 2      O34.219  ---------------------------------------------------------------------- Vital Signs  Weight       335                               Height:        5'8"  (lb):  BMI:         50.93  ---------------------------------------------------------------------- Fetal Evaluation  Num Of Fetuses:          1  Fetal Heart              143  Rate(bpm):  Cardiac Activity:        Observed  Presentation:            Cephalic  Placenta:                Posterior  P. Cord Insertion:       Previously Visualized  Amniotic Fluid  AFI FV:      Within normal limits  AFI Sum(cm)     %Tile       Largest Pocket(cm)  7.66            <  3         4.3  RUQ(cm)       RLQ(cm)       LUQ(cm)        LLQ(cm)  0             3.36          0              4.3 ---------------------------------------------------------------------- Biometry  BPD:      71.5  mm     G. Age:  28w 5d         10  %    CI:         70.63  %    70 - 86                                                          FL/HC:       21.7  %    19.2 - 21.4  HC:      271.2  mm     G. Age:  29w 4d         12  %    HC/AC:       1.04       0.99 - 1.21  AC:      260.3  mm     G. Age:  30w 1d         55  %    FL/BPD:      82.2  %    71 - 87  FL:       58.8  mm     G. Age:  30w 5d         59  %    FL/AC:       22.6  %    20 - 24  HUM:      53.2  mm     G. Age:  31w 0d         72  %  LV:        6.6  mm  Est. FW:    1523   g      3 lb 6 oz     48  %                     m ---------------------------------------------------------------------- Gestational Age  U/S Today:     29w 6d                                        EDD:    02/12/19  Best:          29w 6d     Det. By:  U/S C R L  (07/29/18)    EDD:    02/12/19 ---------------------------------------------------------------------- Anatomy  Cranium:               Appears normal         LVOT:                   Previously seen  Cavum:                 Appears normal  Aortic Arch:            Previously seen  Ventricles:            Appears normal         Ductal Arch:            Previously seen  Choroid Plexus:        Appears normal         Diaphragm:              Appears normal  Cerebellum:            Appears normal         Stomach:                 Appears normal,                                                                        left sided  Posterior Fossa:       Appears normal         Abdomen:                Appears normal  Nuchal Fold:           Not applicable (>20    Abdominal Wall:         Appears nml (cord                         wks GA)                                        insert, abd wall)  Face:                  Orbits previously      Cord Vessels:           Appears normal (3                         seen, proile NL                                vessel cord)  Lips:                  Previously seen        Kidneys:                Appear normal  Palate:                Previously seen        Bladder:                Appears normal  Thoracic:              Appears normal         Spine:                  Not well visualized  Heart:                 Previously seen  Upper Extremities:      Previously seen  RVOT:                  Previously seen        Lower Extremities:      Previously seen  Other:  Technically difficult due to maternal habitus and fetal position. ---------------------------------------------------------------------- Cervix Uterus Adnexa  Cervix  Not visualized (advanced GA >24wks)  Left Ovary  Not visualized.  Right Ovary  Not visualized.  Adnexa  No abnormality visualized. ---------------------------------------------------------------------- Impression  Amniotic fluid is normal and good fetal activity is seen. Fetal  growth is appropriate for gestational age. ---------------------------------------------------------------------- Recommendations  An appointment was made for her to return in 4 weeks for  fetal growth assessment. ----------------------------------------------------------------------                       Noralee Spaceavi Shankar, MD Electronically Signed Corrected Final Report  12/03/2018 03:16 pm ----------------------------------------------------------------------   Assessment and Plan:  Pregnancy: G3P2002 at  7235w2d 1. Abnormal genetic test  2. Supervision of high risk pregnancy, antepartum  3. Previous cesarean delivery affecting pregnancy - Opts for #3, scheduled  4. Chronic hypertension affecting pregnancy - on baby asa, no BP meds - follow up MFM u/s next month  5. BMI 50.0-59.9, adult (HCC) - Hemoglobin A1c - she didn't keep her appt for 2 hour GTT  Preterm labor symptoms and general obstetric precautions including but not limited to vaginal bleeding, contractions, leaking of fluid and fetal movement were reviewed in detail with the patient. I discussed the assessment and treatment plan with the patient. The patient was provided an opportunity to ask questions and all were answered. The patient agreed with the plan and demonstrated an understanding of the instructions. The patient was advised to call back or seek an in-person office evaluation/[facility] for any urgent or concerning symptoms. Please refer to After Visit Summary for other counseling recommendations.   I provided 10 minutes of face-to-face time during this encounter.  Return in about 2 weeks (around 12/24/2018).  Future Appointments  Date Time Provider Department Center  12/31/2018 10:30 AM WH-MFC NURSE WH-MFC MFC-US  12/31/2018 10:30 AM WH-MFC US 1 WH-MFCUS MFC-US    Allie BossierMyra C Nikeia Henkes, MD [facility], [facility]

## 2018-12-15 ENCOUNTER — Other Ambulatory Visit: Payer: Self-pay | Admitting: *Deleted

## 2018-12-15 DIAGNOSIS — O99210 Obesity complicating pregnancy, unspecified trimester: Secondary | ICD-10-CM

## 2018-12-15 DIAGNOSIS — O10919 Unspecified pre-existing hypertension complicating pregnancy, unspecified trimester: Secondary | ICD-10-CM

## 2018-12-15 DIAGNOSIS — O0990 Supervision of high risk pregnancy, unspecified, unspecified trimester: Secondary | ICD-10-CM

## 2018-12-16 ENCOUNTER — Other Ambulatory Visit: Payer: Self-pay

## 2018-12-16 ENCOUNTER — Other Ambulatory Visit: Payer: Medicaid Other

## 2018-12-16 DIAGNOSIS — O99210 Obesity complicating pregnancy, unspecified trimester: Secondary | ICD-10-CM

## 2018-12-16 DIAGNOSIS — O10919 Unspecified pre-existing hypertension complicating pregnancy, unspecified trimester: Secondary | ICD-10-CM

## 2018-12-16 DIAGNOSIS — O0990 Supervision of high risk pregnancy, unspecified, unspecified trimester: Secondary | ICD-10-CM

## 2018-12-17 LAB — CBC
Hematocrit: 36.4 % (ref 34.0–46.6)
Hemoglobin: 11.8 g/dL (ref 11.1–15.9)
MCH: 27.6 pg (ref 26.6–33.0)
MCHC: 32.4 g/dL (ref 31.5–35.7)
MCV: 85 fL (ref 79–97)
Platelets: 350 10*3/uL (ref 150–450)
RBC: 4.27 x10E6/uL (ref 3.77–5.28)
RDW: 13.3 % (ref 11.7–15.4)
WBC: 10.9 10*3/uL — ABNORMAL HIGH (ref 3.4–10.8)

## 2018-12-17 LAB — GLUCOSE TOLERANCE, 2 HOURS W/ 1HR
Glucose, 1 hour: 120 mg/dL (ref 65–179)
Glucose, 2 hour: 79 mg/dL (ref 65–152)
Glucose, Fasting: 77 mg/dL (ref 65–91)

## 2018-12-17 LAB — SYPHILIS: RPR W/REFLEX TO RPR TITER AND TREPONEMAL ANTIBODIES, TRADITIONAL SCREENING AND DIAGNOSIS ALGORITHM: RPR Ser Ql: NONREACTIVE

## 2018-12-17 LAB — HIV ANTIBODY (ROUTINE TESTING W REFLEX): HIV Screen 4th Generation wRfx: NONREACTIVE

## 2018-12-25 ENCOUNTER — Telehealth: Payer: Self-pay | Admitting: Obstetrics & Gynecology

## 2018-12-25 NOTE — Telephone Encounter (Signed)
Called the patient to inform of the upcoming mychart visit. Left a detailed voicemail message of how to log into mychart and complete the appointment. Informed the patient if she has any questions please call our office at [phone]. °

## 2018-12-26 ENCOUNTER — Telehealth (INDEPENDENT_AMBULATORY_CARE_PROVIDER_SITE_OTHER): Payer: Medicaid Other | Admitting: Family Medicine

## 2018-12-26 ENCOUNTER — Encounter: Payer: Self-pay | Admitting: Family Medicine

## 2018-12-26 ENCOUNTER — Other Ambulatory Visit: Payer: Self-pay

## 2018-12-26 DIAGNOSIS — R898 Other abnormal findings in specimens from other organs, systems and tissues: Secondary | ICD-10-CM

## 2018-12-26 DIAGNOSIS — O10919 Unspecified pre-existing hypertension complicating pregnancy, unspecified trimester: Secondary | ICD-10-CM

## 2018-12-26 DIAGNOSIS — O0993 Supervision of high risk pregnancy, unspecified, third trimester: Secondary | ICD-10-CM | POA: Diagnosis not present

## 2018-12-26 DIAGNOSIS — O10913 Unspecified pre-existing hypertension complicating pregnancy, third trimester: Secondary | ICD-10-CM | POA: Diagnosis not present

## 2018-12-26 DIAGNOSIS — O0990 Supervision of high risk pregnancy, unspecified, unspecified trimester: Secondary | ICD-10-CM

## 2018-12-26 DIAGNOSIS — O34219 Maternal care for unspecified type scar from previous cesarean delivery: Secondary | ICD-10-CM

## 2018-12-26 DIAGNOSIS — Z3A33 33 weeks gestation of pregnancy: Secondary | ICD-10-CM

## 2018-12-26 NOTE — Progress Notes (Signed)
    PRENATAL VISIT NOTE  Subjective:  Terri Tran is a 28 y.o. G3P2002 at [redacted]w[redacted]d being seen today for ongoing prenatal care.  She is currently monitored for the following issues for this high-risk pregnancy and has Obesity in pregnancy; BMI 50.0-59.9, adult ([place]); Chronic hypertension affecting pregnancy; Supervision of high risk pregnancy, antepartum; Previous cesarean delivery affecting pregnancy; GBS bacteriuria; and Abnormal genetic test on their problem list.  Patient reports contractions since last week.  Contractions: Irregular. Vag. Bleeding: None.  Movement: Present. Denies leaking of fluid.   The following portions of the patient's history were reviewed and updated as appropriate: allergies, current medications, past family history, past medical history, past social history, past surgical history and problem list.   Objective:  There were no vitals filed for this visit. BP 123/78 on 12/25/2018  Fetal Status:     Movement: Present     General:  Alert, oriented and cooperative. Patient is in no acute distress.  Skin: Skin is warm and dry. No rash noted.   Cardiovascular: Normal heart rate noted  Respiratory: Normal respiratory effort, no problems with respiration noted  Abdomen: Soft, gravid, appropriate for gestational age.  Pain/Pressure: Present     Pelvic: Cervical exam deferred        Extremities: Normal range of motion.  Edema: None  Mental Status: Normal mood and affect. Normal behavior. Normal judgment and thought content.   Assessment and Plan:  Pregnancy: G3P2002 at [redacted]w[redacted]d 1. Abnormal genetic test   2. Supervision of high risk pregnancy, antepartum Continue prenatal care.   3. Chronic hypertension affecting pregnancy BP stable on ASA, no meds BabyScripts data reviewed U/S for growth nml (48%) on 12/03/2018 Begin AP testing at 36 wks  4. Previous cesarean delivery affecting pregnancy For RCS  Preterm labor symptoms and general obstetric precautions including  but not limited to vaginal bleeding, contractions, leaking of fluid and fetal movement were reviewed in detail with the patient. Please refer to After Visit Summary for other counseling recommendations.   Return in about 2 weeks (around 01/09/2019) for OB visit and BPP, in person, [facility].  Future Appointments  Date Time Provider [place]  12/31/2018 10:30 AM [place] MFC-US  12/31/2018 10:30 AM WH-MFC [place] 1 WH-MFCUS MFC-US    Donnamae Jude, MD

## 2018-12-26 NOTE — Progress Notes (Signed)
I connected with  Terri Tran on 12/26/18 at 11:15 AM EDT by telephone and verified that I am speaking with the correct person using two identifiers.   I discussed the limitations, risks, security and privacy concerns of performing an evaluation and management service by telephone  And virtual and the availability of in person appointments. I also discussed with the patient that there may be a patient responsible charge related to this service. The patient expressed understanding and agreed to proceed.  C/o " I feel like I am dilating- started hurting while in [employer] - had to stop what she was doing- c/o is a sharp pain, contractions.  C/o feeling the pain everyday- 2-3 times.  She is active in babyscripts and last bp was yesterday 123 78. She will recheck today.   Melik Blancett,RN 12/26/2018  11:00 AM

## 2018-12-31 ENCOUNTER — Ambulatory Visit (HOSPITAL_COMMUNITY): Payer: Medicaid Other

## 2018-12-31 ENCOUNTER — Encounter (HOSPITAL_COMMUNITY): Payer: Self-pay

## 2019-01-07 ENCOUNTER — Ambulatory Visit (HOSPITAL_COMMUNITY)
Admission: RE | Admit: 2019-01-07 | Discharge: 2019-01-07 | Disposition: A | Discharge status: Home / Self Care | Payer: Medicaid Other | Source: Ambulatory Visit | Attending: Obstetrics and Gynecology | Admitting: Obstetrics and Gynecology

## 2019-01-07 ENCOUNTER — Ambulatory Visit (HOSPITAL_COMMUNITY): Payer: Medicaid Other | Admitting: *Deleted

## 2019-01-07 ENCOUNTER — Encounter (HOSPITAL_COMMUNITY): Payer: Self-pay

## 2019-01-07 ENCOUNTER — Other Ambulatory Visit: Payer: Self-pay

## 2019-01-07 DIAGNOSIS — O34219 Maternal care for unspecified type scar from previous cesarean delivery: Secondary | ICD-10-CM | POA: Diagnosis not present

## 2019-01-07 DIAGNOSIS — O99213 Obesity complicating pregnancy, third trimester: Secondary | ICD-10-CM | POA: Diagnosis not present

## 2019-01-07 DIAGNOSIS — Z362 Encounter for other antenatal screening follow-up: Secondary | ICD-10-CM | POA: Diagnosis not present

## 2019-01-07 DIAGNOSIS — R898 Other abnormal findings in specimens from other organs, systems and tissues: Secondary | ICD-10-CM

## 2019-01-07 DIAGNOSIS — O0990 Supervision of high risk pregnancy, unspecified, unspecified trimester: Secondary | ICD-10-CM

## 2019-01-07 DIAGNOSIS — Z3A34 34 weeks gestation of pregnancy: Secondary | ICD-10-CM

## 2019-01-07 DIAGNOSIS — O10013 Pre-existing essential hypertension complicating pregnancy, third trimester: Secondary | ICD-10-CM | POA: Diagnosis not present

## 2019-01-07 DIAGNOSIS — O10919 Unspecified pre-existing hypertension complicating pregnancy, unspecified trimester: Secondary | ICD-10-CM | POA: Diagnosis not present

## 2019-01-08 ENCOUNTER — Other Ambulatory Visit (HOSPITAL_COMMUNITY): Payer: Self-pay | Admitting: *Deleted

## 2019-01-08 DIAGNOSIS — O10913 Unspecified pre-existing hypertension complicating pregnancy, third trimester: Secondary | ICD-10-CM

## 2019-01-14 ENCOUNTER — Telehealth: Payer: Self-pay | Admitting: *Deleted

## 2019-01-14 NOTE — Telephone Encounter (Signed)
Terri Tran called and left a message she is confused because on her [place] from last week it says her EDD is 02/22/19.  I reviewed her [place] and discussed with Dr. Kennon Rounds. I called Terri Tran and explained her EDD is still 02/09/19 based on her LMP and this was decided way back in March and hasn't changed. I explained MFM is going by 02/12/19 because they did not have her LMP date.  I explained on her last [place] they were saying by measurements baby EDD would be 02/22/19 but that does not change her  EDD and baby measures fine- appropriate growth.  She voices understanding.

## 2019-01-16 IMAGING — US US MFM OB FOLLOW-UP
1 series · 14 of 28 positions shown · non-contrast
Comparison: none

[Series 1: us mfm ob follow-up · 73 acquisitions, 14 frames shown]
[im 3/73]
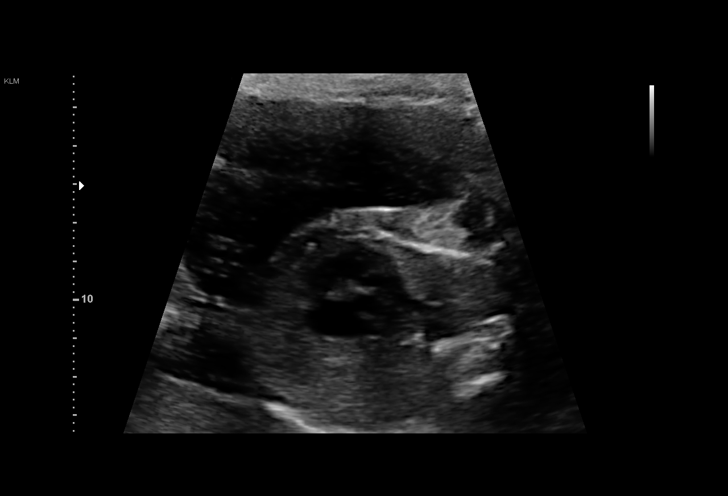
[im 9/73]
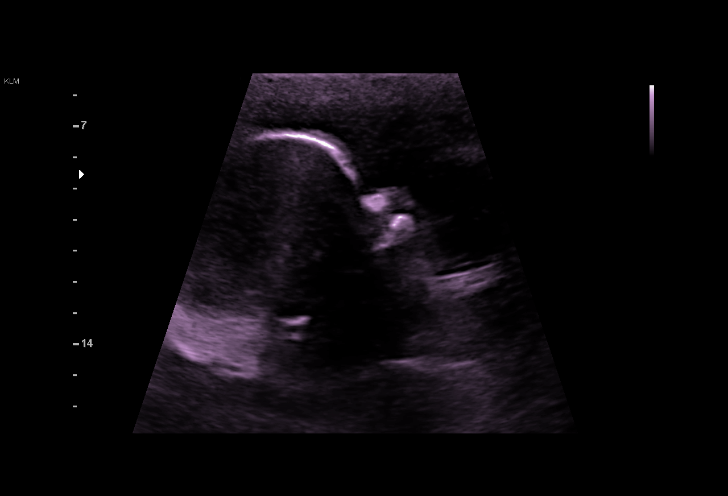
[im 14/73]
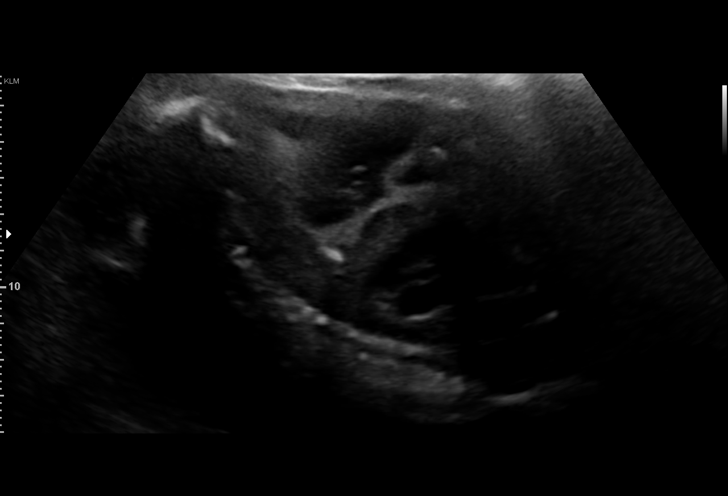
[im 19/73]
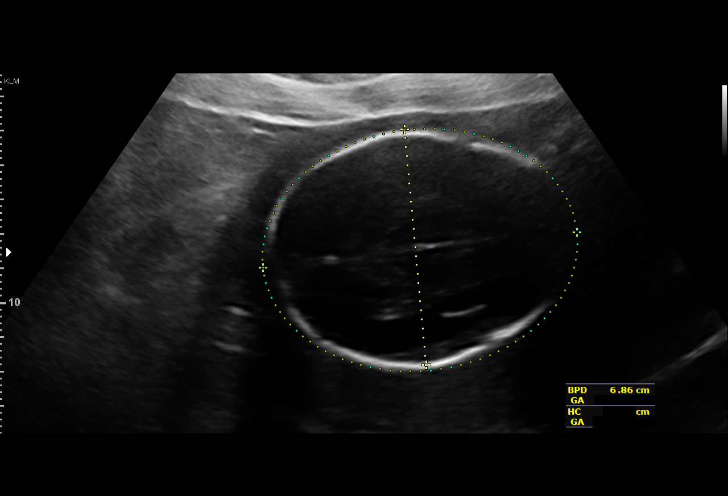
[im 25/73]
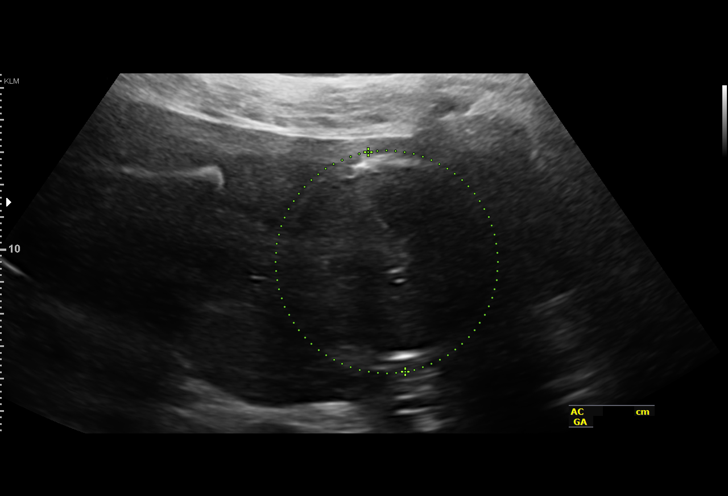
[im 30/73]
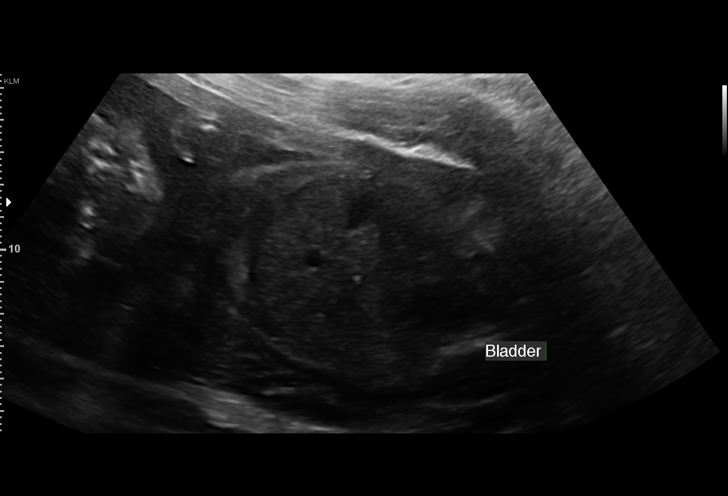
[im 35/73]
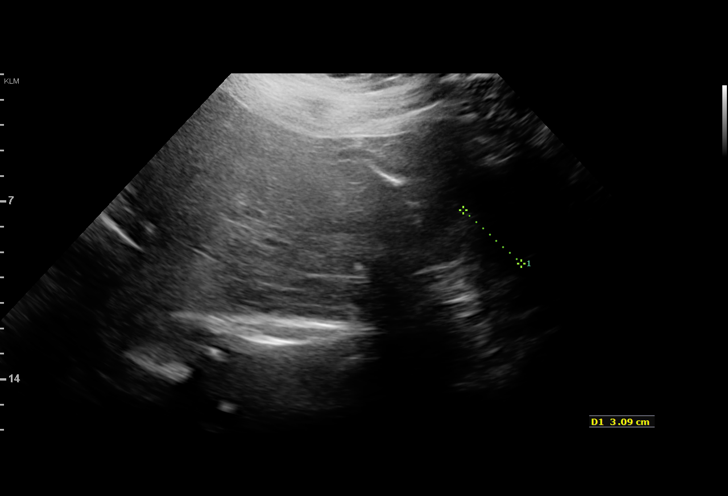
[im 41/73]
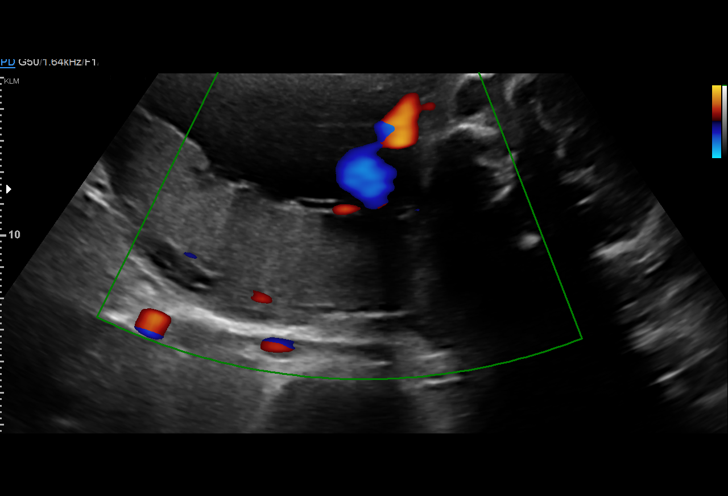
[im 46/73]
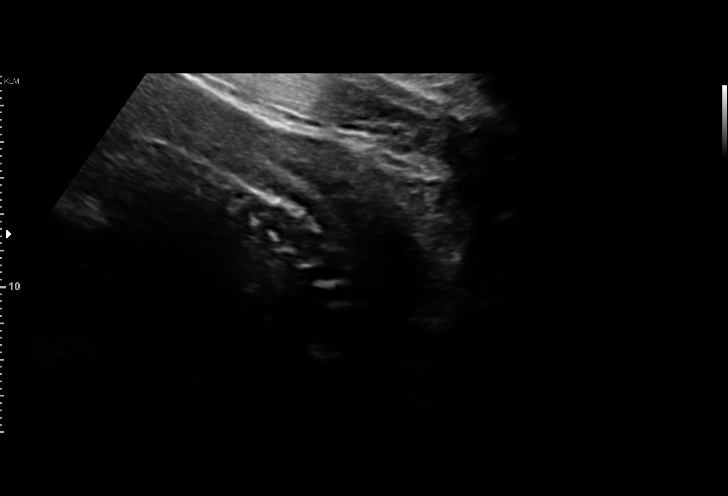
[im 51/73]
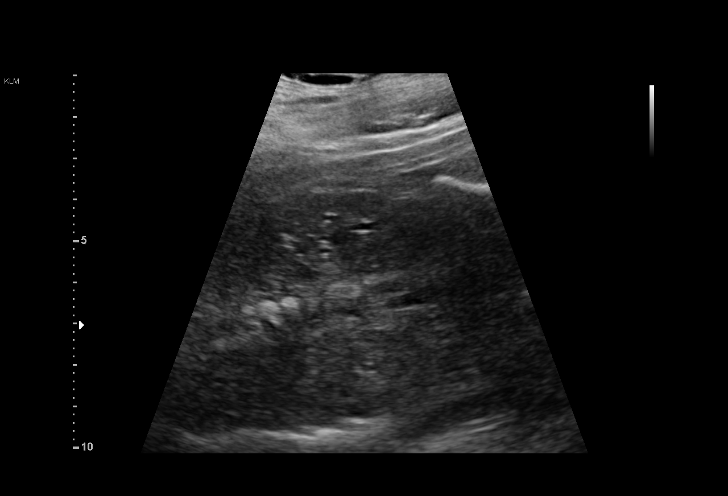
[im 57/73]
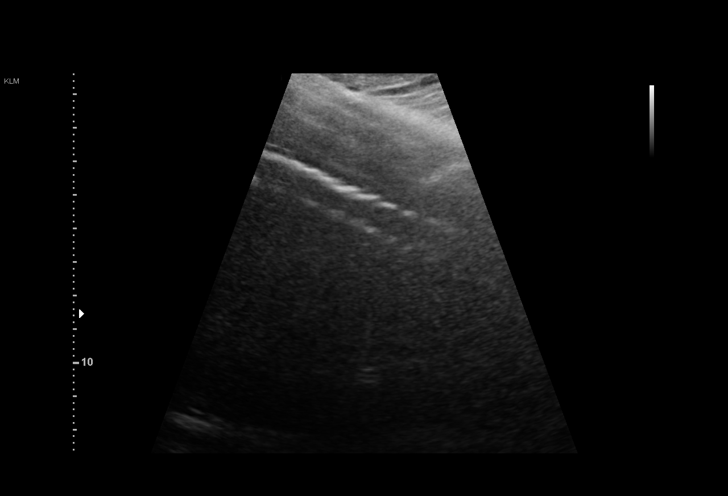
[im 62/73]
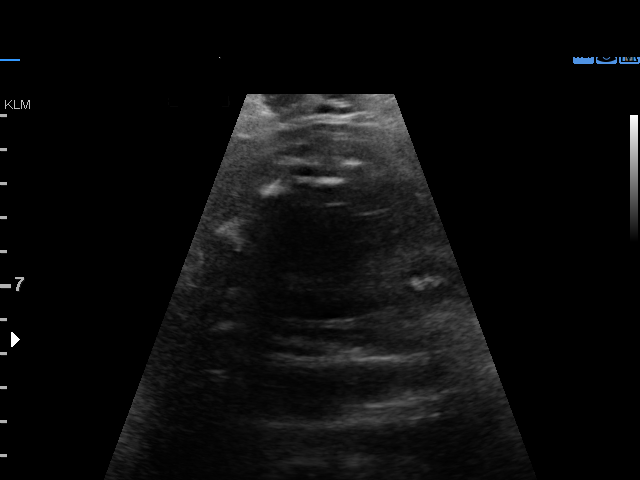
[im 67/73]
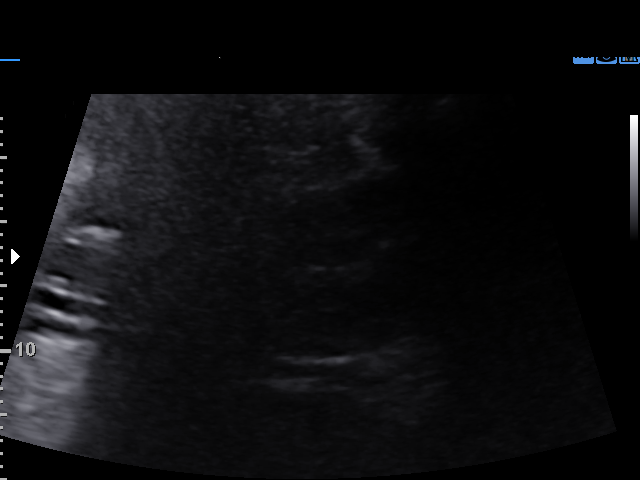
[im 73/73]
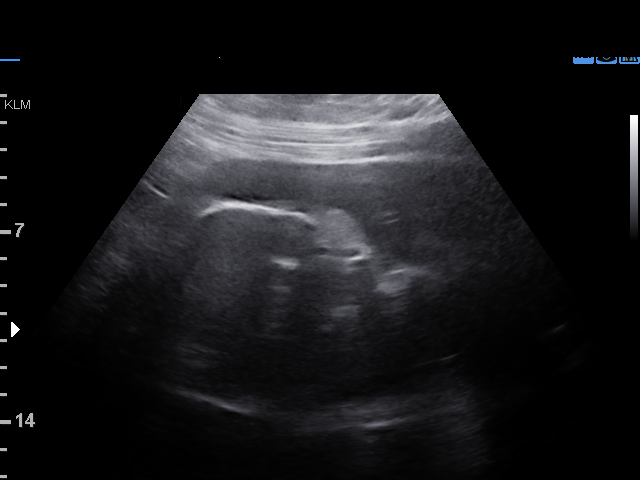

[14 of 28 positions shown; findings below may reference images not displayed]

1  RTOYOTA JOSHJAX          [id number]      [id number]     [id number]
Indications

26 weeks gestation of pregnancy
Hypertension - Chronic/Pre-existing on
labetalol
History of cesarean delivery, currently
pregnant
Antenatal follow-up for nonvisualized fetal
anatomy
Maternal morbid obesity
OB History

Blood Type:            Height:  5'8"   Weight (lb):  337      BMI:
Gravidity:    2         Term:   1        Prem:   0        SAB:   0
TOP:          0       Ectopic:  0        Living: 1
Fetal Evaluation

Num Of Fetuses:     1
Fetal Heart         141
Rate(bpm):
Cardiac Activity:   Observed
Presentation:       Breech
Placenta:           Posterior, above cervical os
P. Cord Insertion:  Visualized

Amniotic Fluid
AFI FV:      Subjectively within normal limits

Largest Pocket(cm)
5.3
Biometry

BPD:      68.3  mm     G. Age:  27w 3d         66  %    CI:        72.81   %   70 - 86
FL/HC:      18.9   %   18.6 -
HC:      254.5  mm     G. Age:  27w 5d         54  %    HC/AC:      1.15       1.05 -
AC:       222   mm     G. Age:  26w 4d         39  %    FL/BPD:     70.6   %   71 - 87
FL:       48.2  mm     G. Age:  26w 1d         21  %    FL/AC:      21.7   %   20 - 24

Est. FW:     959  gm      2 lb 2 oz     51  %
Gestational Age

LMP:           26w 1d       Date:   12/06/15                 EDD:   09/11/16
U/S Today:     27w 0d                                        EDD:   09/05/16
Best:          26w 5d    Det. By:   Early Ultrasound         EDD:   09/07/16
(01/13/16)
Anatomy

Cranium:               Appears normal         Aortic Arch:            Appears normal
Cavum:                 Appears normal         Ductal Arch:            Appears normal
Ventricles:            Appears normal         Diaphragm:              Appears normal
Choroid Plexus:        Previously seen        Stomach:                Appears normal, left
sided
Cerebellum:            Previously seen        Abdomen:                Appears normal
Posterior Fossa:       Previously seen        Abdominal Wall:         Appears nml (cord
insert, abd wall)
Nuchal Fold:           Not applicable (>20    Cord Vessels:           Appears normal (3
wks GA)                                        vessel cord)
Face:                  Appears normal         Kidneys:                Appear normal
(orbits and profile)
Lips:                  Appears normal         Bladder:                Appears normal
Thoracic:              Appears normal         Spine:                  Appears normal
Heart:                 Appears normal         Upper Extremities:      Appears normal
(4CH, axis, and si
RVOT:                  Appears normal         Lower Extremities:      Appears normal
LVOT:                  Appears normal

Other:  Male gender. Right Heel and Left 5th digit visualized. Technically
difficult due to maternal habitus and fetal position.
Cervix Uterus Adnexa

Cervix
Length:            3.7  cm.
Normal appearance by transabdominal scan.
Impression

SIUP at 26+5 weeks
Normal interval anatomy; anatomic survey complete
Normal amniotic fluid volume
Appropriate interval growth with EFW at the 51st %tile
Recommendations

Follow-up ultrasound for growth in 4 weeks

## 2019-01-19 ENCOUNTER — Telehealth (INDEPENDENT_AMBULATORY_CARE_PROVIDER_SITE_OTHER): Payer: Medicaid Other | Admitting: General Practice

## 2019-01-19 ENCOUNTER — Ambulatory Visit (INDEPENDENT_AMBULATORY_CARE_PROVIDER_SITE_OTHER): Payer: Medicaid Other | Admitting: *Deleted

## 2019-01-19 ENCOUNTER — Other Ambulatory Visit (HOSPITAL_COMMUNITY)
Admission: RE | Admit: 2019-01-19 | Discharge: 2019-01-19 | Disposition: A | Discharge status: Home / Self Care | Payer: Medicaid Other | Source: Ambulatory Visit | Attending: Obstetrics & Gynecology | Admitting: Obstetrics & Gynecology

## 2019-01-19 ENCOUNTER — Ambulatory Visit (INDEPENDENT_AMBULATORY_CARE_PROVIDER_SITE_OTHER): Payer: Medicaid Other | Admitting: Obstetrics & Gynecology

## 2019-01-19 ENCOUNTER — Ambulatory Visit: Payer: Self-pay

## 2019-01-19 ENCOUNTER — Other Ambulatory Visit: Payer: Self-pay

## 2019-01-19 VITALS — BP 124/77 | HR 85 | Wt 340.0 lb

## 2019-01-19 DIAGNOSIS — Z3A37 37 weeks gestation of pregnancy: Secondary | ICD-10-CM

## 2019-01-19 DIAGNOSIS — R898 Other abnormal findings in specimens from other organs, systems and tissues: Secondary | ICD-10-CM

## 2019-01-19 DIAGNOSIS — O10913 Unspecified pre-existing hypertension complicating pregnancy, third trimester: Secondary | ICD-10-CM | POA: Diagnosis present

## 2019-01-19 DIAGNOSIS — O34219 Maternal care for unspecified type scar from previous cesarean delivery: Secondary | ICD-10-CM

## 2019-01-19 DIAGNOSIS — Z6841 Body Mass Index (BMI) 40.0 and over, adult: Secondary | ICD-10-CM

## 2019-01-19 DIAGNOSIS — R8271 Bacteriuria: Secondary | ICD-10-CM

## 2019-01-19 DIAGNOSIS — O0990 Supervision of high risk pregnancy, unspecified, unspecified trimester: Secondary | ICD-10-CM

## 2019-01-19 DIAGNOSIS — O10919 Unspecified pre-existing hypertension complicating pregnancy, unspecified trimester: Secondary | ICD-10-CM | POA: Insufficient documentation

## 2019-01-19 DIAGNOSIS — O99820 Streptococcus B carrier state complicating pregnancy: Secondary | ICD-10-CM

## 2019-01-19 DIAGNOSIS — O0993 Supervision of high risk pregnancy, unspecified, third trimester: Secondary | ICD-10-CM

## 2019-01-19 NOTE — Telephone Encounter (Signed)
Received phone call alert from [employer] due to multiple elevated blood pressures. Patient had BP of 140/89 over the weekend and 154/84 today. Per chart review, patient saw Dr Kennon Rounds on 8/7 and was instructed to follow up in 2 weeks for OB visit and NST/BPP- no appt was made. Called patient and asked how she was feeling. She denies headaches, dizziness or blurry vision but reports her BP has been elevated even though she hasn't put all of them in [employer]. She also reports recent onset of pelvic pressure/pain as well as contractions. Patient is not on BP medication and has chronic HTN. Offered appt today at 2:55 with a provider as well as NST/BPP. Patient verbalized understanding & states she will be here then. Patient had no questions.

## 2019-01-19 NOTE — Addendum Note (Signed)
Addended by: Alric Seton on: 01/19/2019 03:15 PM   Modules accepted: Orders

## 2019-01-19 NOTE — Progress Notes (Addendum)
   PRENATAL VISIT NOTE  Subjective:  Terri Tran is a 28 y.o. G3P2002 at [redacted]w[redacted]d being seen today for ongoing prenatal care.  She is currently monitored for the following issues for this high-risk pregnancy and has Obesity in pregnancy; BMI 50.0-59.9, adult ([place]); Chronic hypertension affecting pregnancy; Supervision of high risk pregnancy, antepartum; Previous cesarean delivery affecting pregnancy; GBS bacteriuria; and Abnormal genetic test on their problem list.  Patient reports no complaints.  Contractions: Not present. Vag. Bleeding: None.  Movement: Present. Denies leaking of fluid.   The following portions of the patient's history were reviewed and updated as appropriate: allergies, current medications, past family history, past medical history, past social history, past surgical history and problem list.   Objective:   Vitals:   01/19/19 1504  BP: 124/77  Pulse: 85  Weight: (!) 340 lb (154.2 kg)    Fetal Status:     Movement: Present     General:  Alert, oriented and cooperative. Patient is in no acute distress.  Skin: Skin is warm and dry. No rash noted.   Cardiovascular: Normal heart rate noted  Respiratory: Normal respiratory effort, no problems with respiration noted  Abdomen: Soft, gravid, appropriate for gestational age.  Pain/Pressure: Absent     Pelvic: Cervical exam deferred        Extremities: Normal range of motion.  Edema: None  Mental Status: Normal mood and affect. Normal behavior. Normal judgment and thought content.   Assessment and Plan:  Pregnancy: G3P2002 at [redacted]w[redacted]d 1. Abnormal genetic test  2. Supervision of high risk pregnancy, antepartum  - Cervicovaginal ancillary only( [place])  3. GBS bacteriuria  4. Previous cesarean delivery affecting pregnancy - #3 scheduled  5. Chronic hypertension affecting pregnancy  - good BP, on baby asa, getting weekly BPP   6. BMI 50.0-59.9, adult ([place])    Preterm labor symptoms and general obstetric  precautions including but not limited to vaginal bleeding, contractions, leaking of fluid and fetal movement were reviewed in detail with the patient. Please refer to After Visit Summary for other counseling recommendations.   Return for weekly BPP with Diane.  Future Appointments  Date Time Provider [place]  01/19/2019  4:15 PM [place]  01/31/2019  8:50 AM MC-MAU 1 MC-INDC None  02/04/2019 11:00 AM WH-MFC NURSE WH-MFC MFC-US  02/04/2019 11:00 AM WH-MFC [place] 3 WH-MFCUS MFC-US    Emily Filbert, MD

## 2019-01-19 NOTE — Patient Instructions (Signed)
TREYANA STURGELL  01/19/2019   Your procedure is scheduled on:  9.14.2020  Arrive at 34 at Entrance C on [address] at [facility]  and [address] [employer]. You are invited to use the FREE valet parking or use the Visitor's parking deck.  Pick up the phone at the desk and dial [phone].  Call this number if you have problems the morning of surgery: [phone]  Remember:   Do not eat food:(After Midnight) Desps de medianoche.  Do not drink clear liquids: (After Midnight) Desps de medianoche.  Take these medicines the morning of surgery with A SIP OF WATER:  none   Do not wear jewelry, make-up or nail polish.  Do not wear lotions, powders, or perfumes. Do not wear deodorant.  Do not shave 48 hours prior to surgery.  Do not bring valuables to the hospital.  [facility] is not   responsible for any belongings or valuables brought to the hospital.  Contacts, dentures or bridgework may not be worn into surgery.  Leave suitcase in the car. After surgery it may be brought to your room.  For patients admitted to the hospital, checkout time is 11:00 AM the day of              discharge.      Please read over the following fact sheets that you were given:     Preparing for Surgery

## 2019-01-20 ENCOUNTER — Telehealth (HOSPITAL_COMMUNITY): Payer: Self-pay | Admitting: *Deleted

## 2019-01-20 LAB — CERVICOVAGINAL ANCILLARY ONLY
Chlamydia: NEGATIVE
Neisseria Gonorrhea: NEGATIVE

## 2019-01-20 NOTE — Telephone Encounter (Signed)
Preadmission screen  

## 2019-01-21 ENCOUNTER — Encounter (HOSPITAL_COMMUNITY): Payer: Self-pay

## 2019-01-27 ENCOUNTER — Telehealth: Payer: Self-pay | Admitting: Obstetrics and Gynecology

## 2019-01-27 NOTE — Telephone Encounter (Signed)
Spoke to patient about her appointment on 9/9 @ 3:15 and 4:15. Patient instructed to wear a face mask for the entire appointment and no visitors are allowed. Patient screened for covid symptoms and denied having.

## 2019-01-28 ENCOUNTER — Telehealth: Payer: Self-pay | Admitting: Obstetrics & Gynecology

## 2019-01-28 ENCOUNTER — Telehealth (INDEPENDENT_AMBULATORY_CARE_PROVIDER_SITE_OTHER): Payer: Medicaid Other | Admitting: Obstetrics and Gynecology

## 2019-01-28 ENCOUNTER — Other Ambulatory Visit: Payer: Medicaid Other

## 2019-01-28 VITALS — BP 124/80

## 2019-01-28 DIAGNOSIS — O99210 Obesity complicating pregnancy, unspecified trimester: Secondary | ICD-10-CM

## 2019-01-28 DIAGNOSIS — O99213 Obesity complicating pregnancy, third trimester: Secondary | ICD-10-CM

## 2019-01-28 DIAGNOSIS — O10913 Unspecified pre-existing hypertension complicating pregnancy, third trimester: Secondary | ICD-10-CM

## 2019-01-28 DIAGNOSIS — O0990 Supervision of high risk pregnancy, unspecified, unspecified trimester: Secondary | ICD-10-CM

## 2019-01-28 DIAGNOSIS — Z3A38 38 weeks gestation of pregnancy: Secondary | ICD-10-CM

## 2019-01-28 DIAGNOSIS — R898 Other abnormal findings in specimens from other organs, systems and tissues: Secondary | ICD-10-CM

## 2019-01-28 DIAGNOSIS — O0993 Supervision of high risk pregnancy, unspecified, third trimester: Secondary | ICD-10-CM

## 2019-01-28 DIAGNOSIS — O10919 Unspecified pre-existing hypertension complicating pregnancy, unspecified trimester: Secondary | ICD-10-CM

## 2019-01-28 DIAGNOSIS — Z6841 Body Mass Index (BMI) 40.0 and over, adult: Secondary | ICD-10-CM

## 2019-01-28 DIAGNOSIS — O34219 Maternal care for unspecified type scar from previous cesarean delivery: Secondary | ICD-10-CM

## 2019-01-28 NOTE — Progress Notes (Signed)
   TELEHEALTH VIRTUAL OBSTETRICS VISIT ENCOUNTER NOTE  Clinic: [facility]  I connected with Terri Tran on 01/28/19 at  4:15 PM EDT by telephone at home and verified that I am speaking with the correct person using two identifiers.   I discussed the limitations, risks, security and privacy concerns of performing an evaluation and management service by telephone and the availability of in person appointments. I also discussed with the patient that there may be a patient responsible charge related to this service. The patient expressed understanding and agreed to proceed.  Subjective:  Terri Tran is a 28 y.o. G3P2002 at [redacted]w[redacted]d being followed for ongoing prenatal care.  She is currently monitored for the following issues for this high-risk pregnancy and has Obesity in pregnancy; BMI 50.0-59.9, adult ([place]); Chronic hypertension affecting pregnancy; Supervision of high risk pregnancy, antepartum; Previous cesarean delivery affecting pregnancy; GBS bacteriuria; and Abnormal genetic test on their problem list.  Patient reports no complaints. Reports fetal movement. Denies any contractions, bleeding or leaking of fluid.   The following portions of the patient's history were reviewed and updated as appropriate: allergies, current medications, past family history, past medical history, past social history, past surgical history and problem list.   Objective:   Vitals:   01/28/19 1627  BP: 124/80    Babyscripts Data Reviewed: no  General:  Alert, oriented and cooperative.   Mental Status: Normal mood and affect perceived. Normal judgment and thought content.  Rest of physical exam deferred due to type of encounter  Assessment and Plan:  Pregnancy: G3P2002 at [redacted]w[redacted]d 1. Obesity in pregnancy F/u weight at nv  2. BMI 50.0-59.9, adult ([place])  3. Chronic hypertension affecting pregnancy Doing well on no meds D/w her need for ap testing. Request made for sometime this  week  4. Supervision of high risk pregnancy, antepartum Patient missed appt for today so virtual visit made  5. Previous cesarean delivery affecting pregnancy Rpt already scheduled   Term labor symptoms and general obstetric precautions including but not limited to vaginal bleeding, contractions, leaking of fluid and fetal movement were reviewed in detail with the patient.  I discussed the assessment and treatment plan with the patient. The patient was provided an opportunity to ask questions and all were answered. The patient agreed with the plan and demonstrated an understanding of the instructions. The patient was advised to call back or seek an in-person office evaluation/go to MAU at [facility] for any urgent or concerning symptoms. Please refer to After Visit Summary for other counseling recommendations.   I provided 10 minutes of non-face-to-face time during this encounter. The visit was conducted via MyChart-medicine  Return in about 1 day (around 01/29/2019).  Future Appointments  Date Time Provider [place]  01/31/2019  8:50 AM MC-MAU 1 MC-INDC None    Aletha Halim, MD [facility], [place]

## 2019-01-28 NOTE — Telephone Encounter (Signed)
The patient called to inform our office she could not make the appointment today as she does not have a babysitter. She stated she was told the appointment was 4:15. Offered the patient a mychart visit for the ob as she requested to cancel all appointments. She also stated she was getting induced on 9/14 and she wont need any other appointments.

## 2019-01-29 ENCOUNTER — Telehealth: Payer: Self-pay | Admitting: Obstetrics and Gynecology

## 2019-01-29 NOTE — Telephone Encounter (Signed)
Attempted to reach patient about her appointment. Was not able to leave a message.  

## 2019-01-30 ENCOUNTER — Other Ambulatory Visit: Payer: Medicaid Other

## 2019-01-30 ENCOUNTER — Telehealth: Payer: Self-pay | Admitting: Family Medicine

## 2019-01-30 NOTE — Telephone Encounter (Signed)
Patient called the office after her appointment time, very upset that she can't find a babysitter. Patient asked to just cancel and said she do NOT need the non stress test, patient said we was making her mad and running her blood pressure up, patient then hung up

## 2019-01-31 ENCOUNTER — Other Ambulatory Visit (HOSPITAL_COMMUNITY)
Admission: RE | Admit: 2019-01-31 | Discharge: 2019-01-31 | Disposition: A | Discharge status: Home / Self Care | Payer: Medicaid Other | Source: Ambulatory Visit | Attending: Obstetrics and Gynecology | Admitting: Obstetrics and Gynecology

## 2019-01-31 ENCOUNTER — Other Ambulatory Visit: Payer: Self-pay

## 2019-01-31 DIAGNOSIS — Z01812 Encounter for preprocedural laboratory examination: Secondary | ICD-10-CM | POA: Diagnosis not present

## 2019-01-31 DIAGNOSIS — Z20828 Contact with and (suspected) exposure to other viral communicable diseases: Secondary | ICD-10-CM | POA: Diagnosis not present

## 2019-01-31 HISTORY — DX: Gestational (pregnancy-induced) hypertension without significant proteinuria, unspecified trimester: O13.9

## 2019-01-31 LAB — CBC
HCT: 37.6 % (ref 36.0–46.0)
Hemoglobin: 12.4 g/dL (ref 12.0–15.0)
MCH: 28.2 pg (ref 26.0–34.0)
MCHC: 33 g/dL (ref 30.0–36.0)
MCV: 85.5 fL (ref 80.0–100.0)
Platelets: 377 10*3/uL (ref 150–400)
RBC: 4.4 MIL/uL (ref 3.87–5.11)
RDW: 13.6 % (ref 11.5–15.5)
WBC: 11.3 10*3/uL — ABNORMAL HIGH (ref 4.0–10.5)
nRBC: 0 % (ref 0.0–0.2)

## 2019-01-31 LAB — TYPE AND SCREEN
ABO/RH(D): A POS
Antibody Screen: NEGATIVE

## 2019-01-31 LAB — RPR: RPR Ser Ql: NONREACTIVE

## 2019-01-31 LAB — SARS CORONAVIRUS 2 (TAT 6-24 HRS): SARS Coronavirus 2: NEGATIVE

## 2019-01-31 LAB — ABO/RH: ABO/RH(D): A POS

## 2019-01-31 NOTE — MAU Note (Signed)
Pt here for PAT covid swab and lab draw. Denies symptoms. Swab collected. Pt informed to not removed blood bank bracelet, verbalizes understanding.

## 2019-02-02 ENCOUNTER — Inpatient Hospital Stay (HOSPITAL_COMMUNITY): Payer: Medicaid Other | Admitting: Anesthesiology

## 2019-02-02 ENCOUNTER — Encounter (HOSPITAL_COMMUNITY)
Admission: RE | Disposition: A | Discharge status: Home / Self Care | Payer: Self-pay | Source: Home / Self Care | Attending: Family Medicine

## 2019-02-02 ENCOUNTER — Encounter (HOSPITAL_COMMUNITY): Payer: Self-pay | Admitting: *Deleted

## 2019-02-02 ENCOUNTER — Other Ambulatory Visit: Payer: Self-pay

## 2019-02-02 ENCOUNTER — Inpatient Hospital Stay (HOSPITAL_COMMUNITY)
Admission: RE | Admit: 2019-02-02 | Discharge: 2019-02-05 | DRG: 787 | Disposition: A | Discharge status: Home / Self Care | Payer: Medicaid Other | Attending: Family Medicine | Admitting: Family Medicine

## 2019-02-02 DIAGNOSIS — O99214 Obesity complicating childbirth: Secondary | ICD-10-CM | POA: Diagnosis present

## 2019-02-02 DIAGNOSIS — O1002 Pre-existing essential hypertension complicating childbirth: Secondary | ICD-10-CM | POA: Diagnosis present

## 2019-02-02 DIAGNOSIS — O10919 Unspecified pre-existing hypertension complicating pregnancy, unspecified trimester: Secondary | ICD-10-CM

## 2019-02-02 DIAGNOSIS — O34219 Maternal care for unspecified type scar from previous cesarean delivery: Secondary | ICD-10-CM

## 2019-02-02 DIAGNOSIS — O0990 Supervision of high risk pregnancy, unspecified, unspecified trimester: Secondary | ICD-10-CM

## 2019-02-02 DIAGNOSIS — O99824 Streptococcus B carrier state complicating childbirth: Secondary | ICD-10-CM | POA: Diagnosis not present

## 2019-02-02 DIAGNOSIS — O34211 Maternal care for low transverse scar from previous cesarean delivery: Principal | ICD-10-CM | POA: Diagnosis present

## 2019-02-02 DIAGNOSIS — Z3A39 39 weeks gestation of pregnancy: Secondary | ICD-10-CM

## 2019-02-02 DIAGNOSIS — R8271 Bacteriuria: Secondary | ICD-10-CM | POA: Diagnosis present

## 2019-02-02 DIAGNOSIS — O139 Gestational [pregnancy-induced] hypertension without significant proteinuria, unspecified trimester: Secondary | ICD-10-CM | POA: Diagnosis present

## 2019-02-02 DIAGNOSIS — O99210 Obesity complicating pregnancy, unspecified trimester: Secondary | ICD-10-CM | POA: Diagnosis present

## 2019-02-02 DIAGNOSIS — Z87891 Personal history of nicotine dependence: Secondary | ICD-10-CM

## 2019-02-02 SURGERY — Surgical Case
Anesthesia: Spinal

## 2019-02-02 MED ORDER — DIBUCAINE (PERIANAL) 1 % EX OINT: 1.0000 "application " | TOPICAL_OINTMENT | CUTANEOUS | Status: DC | PRN

## 2019-02-02 MED ORDER — ACETAMINOPHEN 500 MG PO TABS
1000.0000 mg | ORAL_TABLET | ORAL | Status: AC
  Administered 2019-02-02: 09:00:00 1000 mg via ORAL

## 2019-02-02 MED ORDER — NALOXONE HCL 0.4 MG/ML IJ SOLN: 0.4000 mg | INTRAMUSCULAR | Status: DC | PRN

## 2019-02-02 MED ORDER — TETANUS-DIPHTH-ACELL PERTUSSIS 5-2.5-18.5 LF-MCG/0.5 IM SUSP: 0.5000 mL | Freq: Once | INTRAMUSCULAR | Status: DC

## 2019-02-02 MED ORDER — DEXTROSE 5 % IV SOLN
INTRAVENOUS | Status: AC
  Filled 2019-02-02: qty 3000

## 2019-02-02 MED ORDER — ONDANSETRON HCL 4 MG/2ML IJ SOLN
INTRAMUSCULAR | Status: AC
  Filled 2019-02-02: qty 2

## 2019-02-02 MED ORDER — DIPHENHYDRAMINE HCL 25 MG PO CAPS: 25.0000 mg | ORAL_CAPSULE | Freq: Four times a day (QID) | ORAL | Status: DC | PRN

## 2019-02-02 MED ORDER — PRENATAL MULTIVITAMIN CH
1.0000 | ORAL_TABLET | Freq: Every day | ORAL | Status: DC
  Administered 2019-02-03 – 2019-02-05 (×3): 1 via ORAL
  Filled 2019-02-02 (×3): qty 1

## 2019-02-02 MED ORDER — SENNOSIDES-DOCUSATE SODIUM 8.6-50 MG PO TABS
2.0000 | ORAL_TABLET | ORAL | Status: DC
  Administered 2019-02-03 – 2019-02-05 (×2): 2 via ORAL
  Filled 2019-02-02 (×3): qty 2

## 2019-02-02 MED ORDER — COCONUT OIL OIL: 1.0000 "application " | TOPICAL_OIL | Status: DC | PRN

## 2019-02-02 MED ORDER — DIPHENHYDRAMINE HCL 50 MG/ML IJ SOLN
12.5000 mg | INTRAMUSCULAR | Status: DC | PRN
  Administered 2019-02-02: 12.5 mg via INTRAVENOUS

## 2019-02-02 MED ORDER — LACTATED RINGERS IV SOLN
INTRAVENOUS | Status: DC | PRN
  Administered 2019-02-02: 11:00:00 via INTRAVENOUS

## 2019-02-02 MED ORDER — NALBUPHINE HCL 10 MG/ML IJ SOLN
5.0000 mg | INTRAMUSCULAR | Status: DC | PRN
  Filled 2019-02-02: qty 0.5

## 2019-02-02 MED ORDER — KETOROLAC TROMETHAMINE 30 MG/ML IJ SOLN
INTRAMUSCULAR | Status: AC
  Filled 2019-02-02: qty 1

## 2019-02-02 MED ORDER — CELECOXIB 200 MG PO CAPS
ORAL_CAPSULE | ORAL | Status: AC
  Filled 2019-02-02: qty 2

## 2019-02-02 MED ORDER — CELECOXIB 200 MG PO CAPS
400.0000 mg | ORAL_CAPSULE | ORAL | Status: AC
  Administered 2019-02-02: 400 mg via ORAL

## 2019-02-02 MED ORDER — OXYTOCIN 40 UNITS IN NORMAL SALINE INFUSION - SIMPLE MED: 2.5000 [IU]/h | INTRAVENOUS | Status: AC

## 2019-02-02 MED ORDER — IBUPROFEN 800 MG PO TABS
800.0000 mg | ORAL_TABLET | Freq: Four times a day (QID) | ORAL | Status: DC
  Administered 2019-02-03 – 2019-02-05 (×8): 800 mg via ORAL
  Filled 2019-02-02 (×10): qty 1

## 2019-02-02 MED ORDER — DEXTROSE 5 % IV SOLN
3.0000 g | INTRAVENOUS | Status: AC
  Administered 2019-02-02: 3 g via INTRAVENOUS
  Filled 2019-02-02: qty 3000

## 2019-02-02 MED ORDER — MEPERIDINE HCL 25 MG/ML IJ SOLN: 6.2500 mg | INTRAMUSCULAR | Status: DC | PRN

## 2019-02-02 MED ORDER — MORPHINE SULFATE (PF) 0.5 MG/ML IJ SOLN
INTRAMUSCULAR | Status: AC
  Filled 2019-02-02: qty 10

## 2019-02-02 MED ORDER — FENTANYL CITRATE (PF) 100 MCG/2ML IJ SOLN
INTRAMUSCULAR | Status: DC | PRN
  Administered 2019-02-02: 15 ug via EPIDURAL
  Administered 2019-02-02: 85 ug via INTRATHECAL

## 2019-02-02 MED ORDER — NALOXONE HCL 4 MG/10ML IJ SOLN
1.0000 ug/kg/h | INTRAVENOUS | Status: DC | PRN
  Filled 2019-02-02: qty 5

## 2019-02-02 MED ORDER — SODIUM CHLORIDE 0.9 % IV SOLN
INTRAVENOUS | Status: DC | PRN
  Administered 2019-02-02: 40 [IU] via INTRAVENOUS

## 2019-02-02 MED ORDER — PHENYLEPHRINE HCL-NACL 20-0.9 MG/250ML-% IV SOLN
INTRAVENOUS | Status: AC
  Filled 2019-02-02: qty 250

## 2019-02-02 MED ORDER — ACETAMINOPHEN 500 MG PO TABS
ORAL_TABLET | ORAL | Status: AC
  Filled 2019-02-02: qty 2

## 2019-02-02 MED ORDER — KETOROLAC TROMETHAMINE 30 MG/ML IJ SOLN
30.0000 mg | Freq: Four times a day (QID) | INTRAMUSCULAR | Status: AC
  Administered 2019-02-02 – 2019-02-03 (×3): 30 mg via INTRAVENOUS
  Filled 2019-02-02 (×3): qty 1

## 2019-02-02 MED ORDER — MORPHINE SULFATE (PF) 0.5 MG/ML IJ SOLN
INTRAMUSCULAR | Status: DC | PRN
  Administered 2019-02-02: .15 mg via INTRATHECAL

## 2019-02-02 MED ORDER — FENTANYL CITRATE (PF) 100 MCG/2ML IJ SOLN
INTRAMUSCULAR | Status: AC
  Filled 2019-02-02: qty 2

## 2019-02-02 MED ORDER — WITCH HAZEL-GLYCERIN EX PADS: 1.0000 "application " | MEDICATED_PAD | CUTANEOUS | Status: DC | PRN

## 2019-02-02 MED ORDER — ENOXAPARIN SODIUM 80 MG/0.8ML ~~LOC~~ SOLN
0.5000 mg/kg | SUBCUTANEOUS | Status: DC
  Administered 2019-02-03 – 2019-02-05 (×3): 75 mg via SUBCUTANEOUS
  Filled 2019-02-02: qty 0.75
  Filled 2019-02-02 (×2): qty 0.8
  Filled 2019-02-02: qty 0.75

## 2019-02-02 MED ORDER — NALBUPHINE HCL 10 MG/ML IJ SOLN
5.0000 mg | Freq: Once | INTRAMUSCULAR | Status: DC | PRN
  Filled 2019-02-02: qty 0.5

## 2019-02-02 MED ORDER — DIPHENHYDRAMINE HCL 25 MG PO CAPS: 25.0000 mg | ORAL_CAPSULE | ORAL | Status: DC | PRN

## 2019-02-02 MED ORDER — SODIUM CHLORIDE 0.9% FLUSH: 3.0000 mL | INTRAVENOUS | Status: DC | PRN

## 2019-02-02 MED ORDER — HYDROMORPHONE HCL 1 MG/ML IJ SOLN
0.2500 mg | INTRAMUSCULAR | Status: DC | PRN
  Administered 2019-02-02: 0.25 mg via INTRAVENOUS
  Administered 2019-02-02: 0.5 mg via INTRAVENOUS
  Administered 2019-02-02: 0.25 mg via INTRAVENOUS

## 2019-02-02 MED ORDER — OXYCODONE-ACETAMINOPHEN 5-325 MG PO TABS
1.0000 | ORAL_TABLET | ORAL | Status: DC | PRN
  Administered 2019-02-02: 1 via ORAL
  Administered 2019-02-04: 2 via ORAL
  Filled 2019-02-02: qty 2
  Filled 2019-02-02: qty 1
  Filled 2019-02-02: qty 2

## 2019-02-02 MED ORDER — OXYTOCIN 40 UNITS IN NORMAL SALINE INFUSION - SIMPLE MED
INTRAVENOUS | Status: AC
  Filled 2019-02-02: qty 1000

## 2019-02-02 MED ORDER — SIMETHICONE 80 MG PO CHEW
80.0000 mg | CHEWABLE_TABLET | ORAL | Status: DC
  Administered 2019-02-03 – 2019-02-05 (×3): 80 mg via ORAL
  Filled 2019-02-02 (×3): qty 1

## 2019-02-02 MED ORDER — MENTHOL 3 MG MT LOZG: 1.0000 | LOZENGE | OROMUCOSAL | Status: DC | PRN

## 2019-02-02 MED ORDER — LACTATED RINGERS IV SOLN
INTRAVENOUS | Status: DC
  Administered 2019-02-02: 22:00:00 via INTRAVENOUS

## 2019-02-02 MED ORDER — LACTATED RINGERS IV SOLN
125.0000 mL/h | INTRAVENOUS | Status: DC
  Administered 2019-02-02 (×2): 125 mL/h via INTRAVENOUS

## 2019-02-02 MED ORDER — PROMETHAZINE HCL 25 MG/ML IJ SOLN: 6.2500 mg | INTRAMUSCULAR | Status: DC | PRN

## 2019-02-02 MED ORDER — SCOPOLAMINE 1 MG/3DAYS TD PT72
1.0000 | MEDICATED_PATCH | Freq: Once | TRANSDERMAL | Status: AC
  Administered 2019-02-02: 13:00:00 1.5 mg via TRANSDERMAL

## 2019-02-02 MED ORDER — PHENYLEPHRINE HCL-NACL 20-0.9 MG/250ML-% IV SOLN
INTRAVENOUS | Status: DC | PRN
  Administered 2019-02-02: 60 ug/min via INTRAVENOUS

## 2019-02-02 MED ORDER — ZOLPIDEM TARTRATE 5 MG PO TABS: 5.0000 mg | ORAL_TABLET | Freq: Every evening | ORAL | Status: DC | PRN

## 2019-02-02 MED ORDER — SODIUM CHLORIDE 0.9 % IR SOLN
Status: DC | PRN
  Administered 2019-02-02: 1

## 2019-02-02 MED ORDER — DIPHENHYDRAMINE HCL 50 MG/ML IJ SOLN
INTRAMUSCULAR | Status: AC
  Filled 2019-02-02: qty 1

## 2019-02-02 MED ORDER — ONDANSETRON HCL 4 MG/2ML IJ SOLN: 4.0000 mg | Freq: Three times a day (TID) | INTRAMUSCULAR | Status: DC | PRN

## 2019-02-02 MED ORDER — SIMETHICONE 80 MG PO CHEW
80.0000 mg | CHEWABLE_TABLET | Freq: Three times a day (TID) | ORAL | Status: DC
  Administered 2019-02-02 – 2019-02-05 (×7): 80 mg via ORAL
  Filled 2019-02-02 (×8): qty 1

## 2019-02-02 MED ORDER — SIMETHICONE 80 MG PO CHEW
80.0000 mg | CHEWABLE_TABLET | ORAL | Status: DC | PRN
  Filled 2019-02-02: qty 1

## 2019-02-02 MED ORDER — ONDANSETRON HCL 4 MG/2ML IJ SOLN
INTRAMUSCULAR | Status: DC | PRN
  Administered 2019-02-02: 4 mg via INTRAVENOUS

## 2019-02-02 MED ORDER — SCOPOLAMINE 1 MG/3DAYS TD PT72
MEDICATED_PATCH | TRANSDERMAL | Status: AC
  Filled 2019-02-02: qty 1

## 2019-02-02 MED ORDER — SODIUM CHLORIDE 0.9 % IV SOLN
INTRAVENOUS | Status: DC | PRN
  Administered 2019-02-02: 12:00:00 via INTRAVENOUS

## 2019-02-02 MED ORDER — OXYCODONE-ACETAMINOPHEN 5-325 MG PO TABS
2.0000 | ORAL_TABLET | ORAL | Status: DC | PRN
  Administered 2019-02-03 – 2019-02-05 (×7): 2 via ORAL
  Filled 2019-02-02 (×6): qty 2

## 2019-02-02 MED ORDER — HYDROMORPHONE HCL 1 MG/ML IJ SOLN
INTRAMUSCULAR | Status: AC
  Filled 2019-02-02: qty 1

## 2019-02-02 MED ORDER — KETOROLAC TROMETHAMINE 30 MG/ML IJ SOLN
30.0000 mg | Freq: Once | INTRAMUSCULAR | Status: AC | PRN
  Administered 2019-02-02: 30 mg via INTRAVENOUS

## 2019-02-02 SURGICAL SUPPLY — 35 items
BENZOIN TINCTURE PRP APPL 2/3 (GAUZE/BANDAGES/DRESSINGS) ×3 IMPLANT
CHLORAPREP W/TINT 26ML (MISCELLANEOUS) ×3 IMPLANT
CLAMP CORD UMBIL (MISCELLANEOUS) IMPLANT
CLOSURE WOUND 1/2 X4 (GAUZE/BANDAGES/DRESSINGS) ×1
CLOTH BEACON ORANGE TIMEOUT ST (SAFETY) ×3 IMPLANT
DRSG OPSITE POSTOP 4X10 (GAUZE/BANDAGES/DRESSINGS) ×3 IMPLANT
ELECT REM PT RETURN 9FT ADLT (ELECTROSURGICAL) ×3
ELECTRODE REM PT RTRN 9FT ADLT (ELECTROSURGICAL) ×1 IMPLANT
EXTRACTOR VACUUM M CUP 4 TUBE (SUCTIONS) IMPLANT
EXTRACTOR VACUUM M CUP 4' TUBE (SUCTIONS)
GAUZE SPONGE 4X4 3PLY NS LF (GAUZE/BANDAGES/DRESSINGS) ×6 IMPLANT
GLOVE BIOGEL PI IND STRL 7.0 (GLOVE) ×2 IMPLANT
GLOVE BIOGEL PI IND STRL 7.5 (GLOVE) ×2 IMPLANT
GLOVE BIOGEL PI INDICATOR 7.0 (GLOVE) ×4
GLOVE BIOGEL PI INDICATOR 7.5 (GLOVE) ×4
GLOVE ECLIPSE 7.5 STRL STRAW (GLOVE) ×3 IMPLANT
GOWN STRL REUS W/TWL LRG LVL3 (GOWN DISPOSABLE) ×9 IMPLANT
KIT ABG SYR 3ML LUER SLIP (SYRINGE) IMPLANT
NEEDLE HYPO 25X5/8 SAFETYGLIDE (NEEDLE) IMPLANT
NS IRRIG 1000ML POUR BTL (IV SOLUTION) ×3 IMPLANT
PACK C SECTION WH (CUSTOM PROCEDURE TRAY) ×3 IMPLANT
PAD ABD 8X10 STRL (GAUZE/BANDAGES/DRESSINGS) ×3 IMPLANT
PAD OB MATERNITY 4.3X12.25 (PERSONAL CARE ITEMS) ×3 IMPLANT
PENCIL SMOKE EVAC W/HOLSTER (ELECTROSURGICAL) ×3 IMPLANT
RTRCTR C-SECT PINK 25CM LRG (MISCELLANEOUS) ×3 IMPLANT
STRIP CLOSURE SKIN 1/2X4 (GAUZE/BANDAGES/DRESSINGS) ×2 IMPLANT
SUT VIC AB 0 CT1 36 (SUTURE) ×3 IMPLANT
SUT VIC AB 0 CTX 36 (SUTURE) ×6
SUT VIC AB 0 CTX36XBRD ANBCTRL (SUTURE) ×3 IMPLANT
SUT VIC AB 2-0 CT1 27 (SUTURE) ×2
SUT VIC AB 2-0 CT1 TAPERPNT 27 (SUTURE) ×1 IMPLANT
SUT VIC AB 4-0 KS 27 (SUTURE) ×3 IMPLANT
TOWEL OR 17X24 6PK STRL BLUE (TOWEL DISPOSABLE) ×3 IMPLANT
TRAY FOLEY W/BAG SLVR 14FR LF (SET/KITS/TRAYS/PACK) ×3 IMPLANT
WATER STERILE IRR 1000ML POUR (IV SOLUTION) ×3 IMPLANT

## 2019-02-02 NOTE — Op Note (Addendum)
Operative Note   SURGERY DATE: 02/02/2019  PRE-OP DIAGNOSIS:  Repeat elective Cesarean section [redacted] weeks gestation  POST-OP DIAGNOSIS:  Repeat elective Cesarean section [redacted] weeks gestation   PROCEDURE: repeat low transverse cesarean section via pfannenstiel skin incision with double layer uterine closure  SURGEON: Surgeon(s) and Role:    * Stinson, Jacob J, DO - Primary    * Rondell Pardon L, DO - Fellow  ASSISTANT: None  ANESTHESIA: spinal  ESTIMATED BLOOD LOSS: 336 mL  DRAINS: 75 mL UOP via indwelling foley  TOTAL IV FLUIDS: 1400 mL crystalloid  VTE PROPHYLAXIS: SCDs to bilateral lower extremities  ANTIBIOTICS: Ancef, within 1 hour of skin incision  SPECIMENS: Placenta sent to pathology  COMPLICATIONS: None  INDICATIONS: repeat elective Cesarean section  FINDINGS: No intra-abdominal adhesions were noted. Grossly normal uterus, tubes and ovaries. Clear amniotic fluid, cephalic female infant, weight 2530 gm, APGARs 8/8, intact placenta.  PROCEDURE IN DETAIL: The patient was taken to the operating room where anesthesia was administered and normal fetal heart tones were confirmed. She was then prepped and draped in the normal fashion in the dorsal supine position with a leftward tilt.  After a time out was performed, a pfannensteil skin incision was made with the scalpel and carried through to the underlying layer of fascia. The fascia was then incised at the midline and this incision was extended laterally with the mayo scissors. Attention was turned to the superior aspect of the fascial incision which was grasped with the kocher clamps x 2, tented up and the rectus muscles were dissected off bluntly and with mayo scissors. In a similar fashion the inferior aspect of the fascial incision was grasped with the kocher clamps, tented up and the rectus muscles dissected off with the mayo scissors. The rectus muscles were then separated in the midline after transecting the rectus  muscles with the bovie, and the peritoneum was entered bluntly. The Alexis retractor was inserted and the vesicouterine peritoneum was identified.  A low transverse hysterotomy was made with the scalpel until the endometrial cavity was breached and the amniotic sac ruptured with the Allis clamp, yielding clear amniotic fluid. This incision was extended bluntly and the infant's head, shoulders and body were delivered atraumatically.The cord was clamped x 2 and cut, and the infant was handed to the awaiting pediatricians, after delayed cord clamping was done.  The placenta was then gradually expressed from the uterus and then the uterus was cleared of all clots and debris. The hysterotomy was repaired with a running suture of 1-0 Vicryl. A second imbricating layer of 1-0 Vicryl suture was then placed. The hysterotomy and all operative sites were reinspected and excellent hemostasis was noted.  The fascia was reapproximated with 0 Vicryl in a simple running fashion bilaterally. The subcutaneous layer was then reapproximated with interrupted sutures of 2-0 plain gut, and the skin was then closed with 4-0 monocryl, in a subcuticular fashion.  The patient  tolerated the procedure well. Sponge, lap, needle, and instrument counts were correct x 2. The patient was transferred to the recovery room awake, alert and breathing independently in stable condition.  Merilyn Baba, DO OB Fellow [facility] [employer])

## 2019-02-02 NOTE — Discharge Summary (Signed)
OB Discharge Summary     Patient Name: Terri Tran DOB: December 15, 1990 MRN: [id number]  Date of admission: 02/02/2019 Delivering MD: Terri Tran   Date of discharge: 02/05/2019  Admitting diagnosis: RCS Intrauterine pregnancy: [redacted]w[redacted]d     Secondary diagnosis:  Active Problems:   Obesity in pregnancy   Chronic hypertension affecting pregnancy   Previous cesarean delivery affecting pregnancy   GBS bacteriuria   Delivered by cesarean delivery following previous cesarean delivery  Additional problems: None     Discharge diagnosis: Term Pregnancy Delivered                                                                                                Post partum procedures: None  Augmentation: None  Complications: None  Hospital course:  Sceduled C/S   28 y.o. yo [id number] at [redacted]w[redacted]d was admitted to the hospital 02/02/2019 for scheduled cesarean section with the following indication:Elective Repeat.  Membrane Rupture Time/Date: 11:42 AM ,02/02/2019   Patient delivered a Viable infant.02/02/2019  Details of operation can be found in separate operative note.  Pateint had an uncomplicated postpartum course.  She is ambulating, tolerating a regular diet, passing flatus, and urinating well. Patient is discharged home in stable condition on  02/05/19         Physical exam  Vitals:   02/04/19 0540 02/04/19 1500 02/04/19 2129 02/05/19 0545  BP: 135/78 138/74 138/78 (!) 144/82  Pulse: 67 65 79 68  Resp: 16 16 16 18   Temp: 98.5 F (36.9 C) 98.4 F (36.9 C) 98.4 F (36.9 C) (!) 97.3 F (36.3 C)  TempSrc: Oral Oral Oral Oral  SpO2:   99% 100%  Weight:      Height:       General: alert, cooperative and no distress looks well  Cardiac: RRR, no rubs or gallops Pulm: chest clear on ausc, no crackles or wheeze, no respiratory distress GI: Abdo soft, mildly tender near incision site. No guarding. Bowel sounds present  Lochia: appropriate Uterine Fundus: firm Incision: Healing well with  no significant drainage, Dressing is clean, dry, and intact DVT Evaluation: No cords or calf tenderness. No significant calf/ankle edema.  Labs: Lab Results  Component Value Date   WBC 10.4 02/03/2019   HGB 11.1 (L) 02/03/2019   HCT 33.4 (L) 02/03/2019   MCV 85.6 02/03/2019   PLT 346 02/03/2019   CMP Latest Ref Rng & Units 02/03/2019  Glucose 65 - 99 mg/dL -  BUN 6 - 20 mg/dL -  Creatinine 0.44 - 1.00 mg/dL 0.65  Sodium 134 - 144 mmol/L -  Potassium 3.5 - 5.2 mmol/L -  Chloride 96 - 106 mmol/L -  CO2 20 - 29 mmol/L -  Calcium 8.7 - 10.2 mg/dL -  Total Protein 6.0 - 8.5 g/dL -  Total Bilirubin 0.0 - 1.2 mg/dL -  Alkaline Phos 39 - 117 IU/L -  AST 0 - 40 IU/L -  ALT 0 - 32 IU/L -    Discharge instruction: per After Visit Summary and "Baby and Me Booklet".  After visit meds:  Allergies as  of 02/05/2019   No Known Allergies     Medication List    STOP taking these medications   aspirin EC 81 MG tablet     TAKE these medications   enalapril 5 MG tablet Commonly known as: VASOTEC Take 1 tablet (5 mg total) by mouth daily.   ibuprofen 800 MG tablet Commonly known as: ADVIL Take 1 tablet (800 mg total) by mouth every 6 (six) hours.   oxyCODONE-acetaminophen 5-325 MG tablet Commonly known as: PERCOCET/ROXICET Take 1-2 tablets by mouth every 4 (four) hours as needed for moderate pain.   prenatal multivitamin Tabs tablet Take 1 tablet by mouth daily.   senna-docusate 8.6-50 MG tablet Commonly known as: Senokot-S Take 2 tablets by mouth daily. Start taking on: February 06, 2019       Diet: routine diet  Activity: Advance as tolerated. Pelvic rest for 6 weeks.   Outpatient follow up: Please schedule this patient for Postpartum visit in: 4 weeks with the following provider: Any provider For C/S patients schedule nurse incision check in weeks 2 weeks: yes High risk pregnancy complicated by: cHTN, GBS bacteruria, history prior C/S Delivery mode:   CS Anticipated Birth Control:  other/unsure (nexplanon v OCPs) PP Procedures needed: Incision check  Schedule Integrated BH visit: no  Follow up Appt: Future Appointments  Date Time Provider Department Center  02/17/2019  2:30 PM [facility] NURSE [facility] WOC  03/02/2019 10:35 AM [place] Tran, Charlie, MD [facility] WOC   Follow up Visit:No follow-ups on file.  Postpartum contraception: outpatient nexplanon  Newborn Data: Live born female  Birth Weight: 5 lb 9.2 oz (2530 g) APGAR: 8, 8  Newborn Delivery   Birth date/time: 02/02/2019 11:43:00 Delivery type: C-Section, Low Transverse Trial of labor: No C-section categorization: Repeat      Baby Feeding: Bottle and Breast Disposition:home with mother   02/05/2019 Terri OctavePoonam Patel, MD    Provider attestation I have seen and examined this patient and agree with above documentation in the resident's note.   Terri LangoLekia M Tran is a 28 y.o. [phone] s/p LTCS.  Pain is well controlled. Plan for birth control is outpatient nexplanon. Method of Feeding: breast  PE:  Gen: well appearing Heart: reg rate Lungs: normal WOB Fundus firm Ext: no pain, no edema  Recent Labs    02/03/19 0824  HGB 11.1*  HCT 33.4*    Assessment S/p LTCS PPD # 3  Plan: - discharge home - postpartum care discussed -2 week incision & BP check -Rx enalapril 5 mg for CHTN - f/u in office in 6 weeks for postpartum visit   Terri HornErin Hamsini Verrilli, NP 11:39 AM

## 2019-02-02 NOTE — Anesthesia Preprocedure Evaluation (Signed)
Anesthesia Evaluation  Patient identified by MRN, date of birth, ID band Patient awake    Reviewed: Allergy & Precautions, H&P , Patient's Chart, lab work & pertinent test results  History of Anesthesia Complications Negative for: history of anesthetic complications  Airway Mallampati: II  TM Distance: >3 FB Neck ROM: full    Dental  (+) Dental Advisory Given   Pulmonary neg pulmonary ROS, former smoker,    Pulmonary exam normal        Cardiovascular hypertension, On Medications (-) DVT Normal cardiovascular exam(-) dysrhythmias (-) pacemaker(-) Cardiac Defibrillator  Rhythm:Regular     Neuro/Psych Depression negative neurological ROS     GI/Hepatic negative GI ROS, Neg liver ROS, GERD  Medicated,  Endo/Other  neg diabetesMorbid obesity  Renal/GU   negative genitourinary   Musculoskeletal negative musculoskeletal ROS (+)   Abdominal (+) + obese,   Peds  Hematology negative hematology ROS (+) anemia ,   Anesthesia Other Findings   Reproductive/Obstetrics (+) Pregnancy                             Anesthesia Physical  Anesthesia Plan  ASA: III  Anesthesia Plan: Spinal   Post-op Pain Management:    Induction:   PONV Risk Score and Plan: 4 or greater and Ondansetron, Dexamethasone, Scopolamine patch - Pre-op and Treatment may vary due to age or medical condition  Airway Management Planned:   Additional Equipment:   Intra-op Plan:   Post-operative Plan:   Informed Consent: I have reviewed the patients History and Physical, chart, labs and discussed the procedure including the risks, benefits and alternatives for the proposed anesthesia with the patient or authorized representative who has indicated his/her understanding and acceptance.     Dental advisory given  Plan Discussed with: CRNA  Anesthesia Plan Comments:         Anesthesia Quick Evaluation

## 2019-02-02 NOTE — Anesthesia Postprocedure Evaluation (Signed)
Anesthesia Post Note  Patient: Terri Tran  Procedure(s) Performed: CESAREAN SECTION (N/A )     Patient location during evaluation: NICU Anesthesia Type: Spinal Level of consciousness: oriented and awake and alert Pain management: pain level controlled Vital Signs Assessment: post-procedure vital signs reviewed and stable Respiratory status: spontaneous breathing and respiratory function stable Cardiovascular status: blood pressure returned to baseline and stable Postop Assessment: no headache, no backache, no apparent nausea or vomiting and able to ambulate Anesthetic complications: no    Last Vitals:  Vitals:   02/02/19 1345 02/02/19 1353  BP: 117/69 118/69  Pulse: 62 (!) 59  Resp: (!) 23 20  Temp:    SpO2: 100% 100%    Last Pain:  Vitals:   02/02/19 1330  TempSrc: Oral  PainSc: 3    Pain Goal:    LLE Motor Response: Purposeful movement (02/02/19 1345) LLE Sensation: Tingling (02/02/19 1345) RLE Motor Response: Purposeful movement (02/02/19 1345) RLE Sensation: Tingling (02/02/19 1345)     Epidural/Spinal Function Cutaneous sensation: Tingles (02/02/19 1345), Patient able to flex knees: Yes (02/02/19 1345), Patient able to lift hips off bed: No (02/02/19 1345), Back pain beyond tenderness at insertion site: No (02/02/19 1345), Progressively worsening motor and/or sensory loss: No (02/02/19 1345), Bowel and/or bladder incontinence post epidural: No (02/02/19 1345)  [name]

## 2019-02-02 NOTE — Transfer of Care (Signed)
Immediate Anesthesia Transfer of Care Note  Patient: Terri Tran  Procedure(s) Performed: CESAREAN SECTION (N/A )  Patient Location: PACU  Anesthesia Type:Spinal  Level of Consciousness: awake  Airway & Oxygen Therapy: Patient Spontanous Breathing  Post-op Assessment: Report given to RN and Post -op Vital signs reviewed and stable  Post vital signs: stable  Last Vitals:  Vitals Value Taken Time  BP 129/69 02/02/19 1250  Temp    Pulse 62 02/02/19 1251  Resp 20 02/02/19 1251  SpO2 100 % 02/02/19 1251  Vitals shown include unvalidated device data.  Last Pain:  Vitals:   02/02/19 0812  TempSrc: Oral         Complications: No apparent anesthesia complications

## 2019-02-02 NOTE — H&P (Signed)
Faculty Practice H&P  Terri Tran is a 28 y.o. female [phone] with IUP at 8264w0d presenting for repeat cesarean section for elective repeat. Pregnancy was been complicated by GBS bacteruria, CHTN, previous c/s.    Pt states she has been having no contractions, no vaginal bleeding, intact membranes, with normal fetal movement.     Prenatal Course Source of Care: [facility] with onset of care at 12 weeks  Pregnancy complications or risks: Patient Active Problem List   Diagnosis Date Noted  . Abnormal genetic test 10/01/2018  . GBS bacteriuria 09/13/2018  . Supervision of high risk pregnancy, antepartum 07/29/2018  . Previous cesarean delivery affecting pregnancy 07/29/2018  . Chronic hypertension affecting pregnancy 08/31/2016  . Obesity in pregnancy 08/28/2016  . BMI 50.0-59.9, adult (HCC) 08/28/2016   She desires nexplanon for contraception.  She plans to breastfeed  Prenatal labs and studies: ABO, Rh: --/--/A POS, A POS Performed at [facility], [address] [place], [place] [address]  [id number](09/12 0944) Antibody: NEG (09/12 0944) Rubella: 1.20 (03/10 1037) RPR: NON REACTIVE (09/12 0944)  HBsAg: Negative (03/10 1037)  HIV: Non Reactive (07/28 0932)  GBS:    1hr Glucola: negative Genetic screening: normal Anatomy US: normal  Past Medical History:  Past Medical History:  Diagnosis Date  . Anemia affecting first pregnancy 2013   was taking Iron supplements  . Depression 2013   postpartum depression after first delivery; was prescribed Xanax  . GERD (gastroesophageal reflux disease)    during pregnancy only; takes Tums; helps  . Hypertension   . Pregnancy induced hypertension     Past Surgical History:  Past Surgical History:  Procedure Laterality Date  . CESAREAN SECTION    . CESAREAN SECTION N/A 09/02/2016   Procedure: CESAREAN SECTION;  Surgeon: Lesly DukesKelly H Leggett, MD;  [facility];  Service: Obstetrics;  Laterality: N/A;    Obstetrical  History:  OB History    Gravida  3   Para  2   Term  2   Preterm  0   AB  0   Living  2     SAB  0   TAB  0   Ectopic  0   Multiple  0   Live Births  2           Gynecological History:  OB History    Gravida  3   Para  2   Term  2   Preterm  0   AB  0   Living  2     SAB  0   TAB  0   Ectopic  0   Multiple  0   Live Births  2           Social History:  Social History   Socioeconomic History  . Marital status: Single    Spouse name: Not on file  . Number of children: Not on file  . Years of education: Not on file  . Highest education level: Not on file  Occupational History  . Not on file  Social Needs  . Financial resource strain: Not on file  . Food insecurity    Worry: Sometimes true    Inability: Sometimes true  . Transportation needs    Medical: No    Non-medical: No  Tobacco Use  . Smoking status: Former Smoker    Packs/day: 0.50    Types: Cigarettes    Quit date: 07/21/2018    Years since quitting:  0.5  . Smokeless tobacco: Never Used  Substance and Sexual Activity  . Alcohol use: No    Alcohol/week: 7.0 standard drinks    Types: 7 Cans of beer per week  . Drug use: No  . Sexual activity: Yes    Birth control/protection: None  Lifestyle  . Physical activity    Days per week: Not on file    Minutes per session: Not on file  . Stress: Not on file  Relationships  . Social Herbalist on phone: Not on file    Gets together: Not on file    Attends religious service: Not on file    Active member of club or organization: Not on file    Attends meetings of clubs or organizations: Not on file    Relationship status: Not on file  Other Topics Concern  . Not on file  Social History Narrative  . Not on file    Family History:  Family History  Problem Relation Age of Onset  . Hypertension Mother     Medications:  Prenatal vitamins,  Current Facility-Administered Medications  Medication Dose Route  Frequency Provider Last Rate Last Dose  . ceFAZolin (ANCEF) 3 g in dextrose 5 % 50 mL IVPB  3 g Intravenous On Call to OR Truett Mainland, DO      . lactated ringers infusion  125 mL/hr Intravenous Continuous Truett Mainland, DO 125 mL/hr at 02/02/19 0831 125 mL/hr at 02/02/19 0831    Allergies: No Known Allergies  Review of Systems: - negative  Physical Exam: Blood pressure (!) 147/83, pulse 77, temperature 98.9 F (37.2 C), temperature source Oral, height 5\' 8"  (1.727 m), weight (!) 154.2 kg, last menstrual period 05/05/2018, SpO2 100 %, unknown if currently breastfeeding. GENERAL: Well-developed, well-nourished female in no acute distress.  LUNGS: Clear to auscultation bilaterally.  HEART: Regular rate and rhythm. ABDOMEN: Soft, nontender, nondistended, gravid. EFW 8 lbs EXTREMITIES: Nontender, no edema, 2+ distal pulses.   Pertinent Labs/Studies:   Lab Results  Component Value Date   WBC 11.3 (H) 01/31/2019   HGB 12.4 01/31/2019   HCT 37.6 01/31/2019   MCV 85.5 01/31/2019   PLT 377 01/31/2019    Assessment : Terri Tran is a 27 y.o. G3P2002 at [redacted]w[redacted]d being admitted for cesarean section secondary to elective repeat  Plan: The risks of cesarean section discussed with the patient included but were not limited to: bleeding which may require transfusion or reoperation; infection which may require antibiotics; injury to bowel, bladder, ureters or other surrounding organs; injury to the fetus; need for additional procedures including hysterectomy in the event of a life-threatening hemorrhage; placental abnormalities wth subsequent pregnancies, incisional problems, thromboembolic phenomenon and other postoperative/anesthesia complications. The patient concurred with the proposed plan, giving informed written consent for the procedure.   Patient has been NPO since last night and will remain NPO for procedure.  Preoperative prophylactic Ancef ordered on call to the OR.    Truett Mainland, DO 02/02/2019, 8:55 AM

## 2019-02-03 LAB — CBC
HCT: 33.4 % — ABNORMAL LOW (ref 36.0–46.0)
Hemoglobin: 11.1 g/dL — ABNORMAL LOW (ref 12.0–15.0)
MCH: 28.5 pg (ref 26.0–34.0)
MCHC: 33.2 g/dL (ref 30.0–36.0)
MCV: 85.6 fL (ref 80.0–100.0)
Platelets: 346 10*3/uL (ref 150–400)
RBC: 3.9 MIL/uL (ref 3.87–5.11)
RDW: 13.7 % (ref 11.5–15.5)
WBC: 10.4 10*3/uL (ref 4.0–10.5)
nRBC: 0 % (ref 0.0–0.2)

## 2019-02-03 LAB — CREATININE, SERUM
Creatinine, Ser: 0.65 mg/dL (ref 0.44–1.00)
GFR calc Af Amer: >60 mL/min (ref >60)
GFR calc non Af Amer: >60 mL/min (ref >60)

## 2019-02-03 NOTE — Progress Notes (Signed)
MOB was referred for history of depression/anxiety. * Referral screened out by Clinical Social Worker because none of the following criteria appear to apply: ~ History of anxiety/depression during this pregnancy, or of post-partum depression following prior delivery. 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 No PMAD symptoms after her second child.  ~ Diagnosis of anxiety and/or depression within last 3 years OR * MOB's symptoms currently being treated with medication and/or therapy.  Please contact the Clinical Social Worker if needs arise, by MOB request, or if MOB scores greater than 9/yes to question 10 on Edinburgh Postpartum Depression Screen.  Ira Busbin Boyd-Gilyard, MSW, LCSW Clinical Social Work [phone]  

## 2019-02-03 NOTE — Plan of Care (Signed)
  Problem: Education: Goal: Knowledge of General Education information will improve Description: Including pain rating scale, medication(s)/side effects and non-pharmacologic comfort measures Outcome: Completed/Met   Problem: Clinical Measurements: Goal: Ability to maintain clinical measurements within normal limits will improve Outcome: Completed/Met Goal: Diagnostic test results will improve Outcome: Completed/Met Goal: Respiratory complications will improve Outcome: Completed/Met Goal: Cardiovascular complication will be avoided Outcome: Completed/Met   Problem: Nutrition: Goal: Adequate nutrition will be maintained Outcome: Completed/Met   Problem: Pain Managment: Goal: General experience of comfort will improve Outcome: Completed/Met

## 2019-02-03 NOTE — Progress Notes (Signed)
The RN called Dr Driver concerning output 0000 02/02/19. Dr  Driver stated she will review input and output levels. Later, 0047 02/02/19, RN spoke to Dr. Dara Lords , patient's out [phone] was 100 ml. No further orders were given.

## 2019-02-03 NOTE — Progress Notes (Signed)
Post Partum Day 1 Subjective: Ms. Alfred is a 28 y.o female G3P2002 POD#1 rLTCS. Patient is doing well with no complaints. Up ad lib with some nurse assistance. Lochia is appropriate with light bleeding and no heavy clotting. Patient denies abdominal pain, cramping, SOB, chest pain, dizziness, headache, vision changes, or lower extremity swelling.   Objective: Blood pressure 128/75, pulse 66, temperature 98 F (36.7 C), temperature source Oral, resp. rate 18, height 5\' 8"  (1.727 m), weight (!) 154.2 kg, last menstrual period 05/05/2018, SpO2 94 %, unknown if currently breastfeeding.  Physical Exam:  General: alert, cooperative and no distress Lochia: appropriate Uterine Fundus: firm Incision: no significant drainage DVT Evaluation: No evidence of DVT seen on physical exam.  Recent Labs    01/31/19 0944  HGB 12.4  HCT 37.6    Assessment/Plan: Ms. Wilk is a 28 y.o female G3P2002 POD#1 rLTCS. Contraception Nexplanon Breast/Bottle Feeding. Continue current care and monitor C-section incision.  Encourage mobilization. Switch from Toradol to oral Motrin.    LOS: 1 day   Margarette Asal 02/03/2019, 8:36 AM    I confirm that I have verified the information documented in the resident's note and that I have also personally reperformed the history, physical exam and all medical decision making activities of this service and have verified that all service and findings are accurately documented in this student's note.   Marcille Buffy DNP, CNM  02/03/19  9:39 AM

## 2019-02-04 ENCOUNTER — Ambulatory Visit (HOSPITAL_COMMUNITY): Payer: Medicaid Other

## 2019-02-04 ENCOUNTER — Other Ambulatory Visit: Payer: Medicaid Other

## 2019-02-04 ENCOUNTER — Encounter: Payer: Medicaid Other | Admitting: Obstetrics & Gynecology

## 2019-02-04 NOTE — Progress Notes (Signed)
CSW received consult due to score 11 on Edinburgh Depression Screen and hx of PPD. CSW conducted assessment via telephone with MOB's permission.  MOB's voice appeared pleasant and MOB was easy to engage.    CSW reviewed MOB's Flavia Shipper results and inquired about MOB's current thoughts and feelings.  MOB reported overall feeling "Good."  MOB shared that MOB was initially nervous about having a c-sections and the recovery process.  CSW validated and normalized MOB's thoughts and feelings.   CSW provided education regarding Baby Blues vs PMADs and provided MOB with resources for mental health follow up.  CSW encouraged MOB to evaluate her mental health throughout the postpartum period with the use of the New Mom Checklist developed by Postpartum Progress as well as the [place] Postnatal Depression Scale and notify a medical professional if symptoms arise. MOB acknowledged having PMAD symptoms with MOB's oldest child and reported feeling "detached from my new baby."  Per MOB, MOB's symptoms subsided after being placed on a medication regiment. MOB did not present with any acute symptoms and denied SI, HI, and DV.  MOB shared that she has a good support team that consists of FOB and MOB's mother.   MOB communicated having all essential items to care for infant and feeling prepared to parent.    Outpatient resources were offered to MOB and MOB declined.   There are no barriers to discharge.   Laurey Arrow, MSW, LCSW Clinical Social Work [phone]

## 2019-02-04 NOTE — Progress Notes (Signed)
POSTPARTUM PROGRESS NOTE  POD #2  Subjective:  Terri Tran is a 28 y.o. [phone] s/p rLTCS at [redacted]w[redacted]d.  She reports she doing well. No acute events overnight. She denies any problems with ambulating, voiding or po intake. Denies nausea or vomiting. She has  passed flatus. Pain is moderately controlled.  Lochia is appropriate.  Objective: Blood pressure 135/78, pulse 67, temperature 98.5 F (36.9 C), temperature source Oral, resp. rate 16, height 5\' 8"  (1.727 m), weight (!) 154.2 kg, last menstrual period 05/05/2018, SpO2 94 %, unknown if currently breastfeeding.  Physical Exam:  General: alert, cooperative and no distress Chest: no respiratory distress Heart:regular rate, distal pulses intact Abdomen: soft, nontender,  Uterine Fundus: firm, appropriately tender DVT Evaluation: No calf swelling or tenderness Extremities: No LE edema Skin: warm, dry; incision clean/dry/intact w/ honeycomb dressing in place  Recent Labs    02/03/19 0824  HGB 11.1*  HCT 33.4*    Assessment/Plan: Terri Tran is a 27 y.o. G3P3003 s/p rLTCS at [redacted]w[redacted]d.  POD#2 - Doing welll; pain moderately controlled. H/H appropriate  Routine postpartum care  Lovenox for VTE prophylaxis Patient has not been ambulating much. Encouraged OOB today.  Contraception: Nexplanon  Feeding: Breast &Bottle  Dispo: Plan for discharge POD#3. Patient requests to stay one more day.    LOS: 2 days   Phill Myron, D.O. OB Fellow  02/04/2019, 10:16 AM

## 2019-02-05 MED ORDER — IBUPROFEN 800 MG PO TABS: 800.0000 mg | ORAL_TABLET | Freq: Four times a day (QID) | ORAL | 0 refills | Status: DC

## 2019-02-05 MED ORDER — OXYCODONE-ACETAMINOPHEN 5-325 MG PO TABS: 1.0000 | ORAL_TABLET | ORAL | 0 refills | Status: DC | PRN

## 2019-02-05 MED ORDER — SENNOSIDES-DOCUSATE SODIUM 8.6-50 MG PO TABS: 2.0000 | ORAL_TABLET | ORAL | Status: DC

## 2019-02-05 MED ORDER — ASPIRIN EC 81 MG PO TBEC: 81.0000 mg | DELAYED_RELEASE_TABLET | Freq: Every day | ORAL | 0 refills | Status: DC

## 2019-02-05 MED ORDER — ENALAPRIL MALEATE 5 MG PO TABS: 5.0000 mg | ORAL_TABLET | Freq: Every day | ORAL | 1 refills | Status: DC

## 2019-02-05 NOTE — Lactation Note (Signed)
This note was copied from a baby's chart. Lactation Consultation Note  Patient Name: Terri Tran Date: 02/05/2019 Reason for consult: Follow-up assessment  Mom's milk is coming to volume. We observed her pumping with size 27 flanges; she needed  size 30 flanges, which were provided. Mom felt more comfortable pumping with the size 30 flanges. While pumping, Mom was taught how to position nipple within flange & how to massage the firmer areas of her breast. I also instructed Mom how to monitor her breasts for firmness so she knows when to pump and not to allow her breasts to become overly full. Mom has [place] & a [facility] referral was faxed. She declined the [facility] loaner, but she was shown how to assemble & use hand pump (single- & double-mode) that was included in pump kit.   Infant was put to the breast when infant was cueing, but Mom's nipple diameter seemed to be an issue for the infant. I let Mom know that as infant ages & puts on weight, she may be able to accommodate Mom's nipple diameter. When infant cried, there was also the suggestion of tongue restriction (labial frenulum was also visible). Infant was paced bottle-fed with the yellow Similac slow-flow nipples & did very well. Mom knows to feed infant/advance volumes until infant is content.   Mom was shown how to wash pump parts. Breast milk storage was discussed.   Matthias Hughs [facility] 02/05/2019, 10:06 AM

## 2019-02-05 NOTE — Discharge Instructions (Signed)
Cesarean Delivery, Care After This sheet gives you information about how to care for yourself after your procedure. Your health care provider may also give you more specific instructions. If you have problems or questions, contact your health care provider. What can I expect after the procedure? After the procedure, it is common to have:  A small amount of blood or clear fluid coming from the incision.  Some redness, swelling, and pain in your incision area.  Some abdominal pain and soreness.  Vaginal bleeding (lochia). Even though you did not have a vaginal delivery, you will still have vaginal bleeding and discharge.  Pelvic cramps.  Fatigue. You may have pain, swelling, and discomfort in the tissue between your vagina and your anus (perineum) if:  Your C-section was unplanned, and you were allowed to labor and push.  An incision was made in the area (episiotomy) or the tissue tore during attempted vaginal delivery. Follow these instructions at home: Incision care   Follow instructions from your health care provider about how to take care of your incision. Make sure you: ? Wash your hands with soap and water before you change your bandage (dressing). If soap and water are not available, use hand sanitizer. ? If you have a dressing, change it or remove it as told by your health care provider. ? Leave stitches (sutures), skin staples, skin glue, or adhesive strips in place. These skin closures may need to stay in place for 2 weeks or longer. If adhesive strip edges start to loosen and curl up, you may trim the loose edges. Do not remove adhesive strips completely unless your health care provider tells you to do that.  Check your incision area every day for signs of infection. Check for: ? More redness, swelling, or pain. ? More fluid or blood. ? Warmth. ? Pus or a bad smell.  Do not take baths, swim, or use a hot tub until your health care provider says it's okay. Ask your health  care provider if you can take showers.  When you cough or sneeze, hug a pillow. This helps with pain and decreases the chance of your incision opening up (dehiscing). Do this until your incision heals. Medicines  Take over-the-counter and prescription medicines only as told by your health care provider.  If you were prescribed an antibiotic medicine, take it as told by your health care provider. Do not stop taking the antibiotic even if you start to feel better.  Do not drive or use heavy machinery while taking prescription pain medicine. Lifestyle  Do not drink alcohol. This is especially important if you are breastfeeding or taking pain medicine.  Do not use any products that contain nicotine or tobacco, such as cigarettes, e-cigarettes, and chewing tobacco. If you need help quitting, ask your health care provider. Eating and drinking  Drink at least 8 eight-ounce glasses of water every day unless told not to by your health care provider. If you breastfeed, you may need to drink even more water.  Eat high-fiber foods every day. These foods may help prevent or relieve constipation. High-fiber foods include: ? Whole grain cereals and breads. ? Brown rice. ? Beans. ? Fresh fruits and vegetables. Activity   If possible, have someone help you care for your baby and help with household activities for at least a few days after you leave the hospital.  Return to your normal activities as told by your health care provider. Ask your health care provider what activities are safe for  you.  Rest as much as possible. Try to rest or take a nap while your baby is sleeping.  Do not lift anything that is heavier than 10 lbs (4.5 kg), or the limit that you were told, until your health care provider says that it is safe.  Talk with your health care provider about when you can engage in sexual activity. This may depend on your: ? Risk of infection. ? How fast you heal. ? Comfort and desire to  engage in sexual activity. General instructions  Do not use tampons or douches until your health care provider approves.  Wear loose, comfortable clothing and a supportive and well-fitting bra.  Keep your perineum clean and dry. Wipe from front to back when you use the toilet.  If you pass a blood clot, save it and call your health care provider to discuss. Do not flush blood clots down the toilet before you get instructions from your health care provider.  Keep all follow-up visits for you and your baby as told by your health care provider. This is important. Contact a health care provider if:  You have: ? A fever. ? Bad-smelling vaginal discharge. ? Pus or a bad smell coming from your incision. ? Difficulty or pain when urinating. ? A sudden increase or decrease in the frequency of your bowel movements. ? More redness, swelling, or pain around your incision. ? More fluid or blood coming from your incision. ? A rash. ? Nausea. ? Little or no interest in activities you used to enjoy. ? Questions about caring for yourself or your baby.  Your incision feels warm to the touch.  Your breasts turn red or become painful or hard.  You feel unusually sad or worried.  You vomit.  You pass a blood clot from your vagina.  You urinate more than usual.  You are dizzy or light-headed. Get help right away if:  You have: ? Pain that does not go away or get better with medicine. ? Chest pain. ? Difficulty breathing. ? Blurred vision or spots in your vision. ? Thoughts about hurting yourself or your baby. ? New pain in your abdomen or in one of your legs. ? A severe headache.  You faint.  You bleed from your vagina so much that you fill more than one sanitary pad in one hour. Bleeding should not be heavier than your heaviest period. Summary  After the procedure, it is common to have pain at your incision site, abdominal cramping, and slight bleeding from your vagina.  Check  your incision area every day for signs of infection.  Tell your health care provider about any unusual symptoms.  Keep all follow-up visits for you and your baby as told by your health care provider. This information is not intended to replace advice given to you by your health care provider. Make sure you discuss any questions you have with your health care provider. Document Released: 01/27/2002 Document Revised: 11/13/2017 Document Reviewed: 11/13/2017 Elsevier Patient Education  2020 [employer].    Postpartum Hypertension Postpartum hypertension is high blood pressure that remains higher than normal after childbirth. You may not realize that you have postpartum hypertension if your blood pressure is not being checked regularly. In most cases, postpartum hypertension will go away on its own, usually within a week of delivery. However, for some women, medical treatment is required to prevent serious complications, such as seizures or stroke. What are the causes? This condition may be caused by one or  more of the following:  Hypertension that existed before pregnancy (chronic hypertension).  Hypertension that comes on as a result of pregnancy (gestational hypertension).  Hypertensive disorders during pregnancy (preeclampsia) or seizures in women who have high blood pressure during pregnancy (eclampsia).  A condition in which the liver, platelets, and red blood cells are damaged during pregnancy (HELLP syndrome).  A condition in which the thyroid produces too much hormones (hyperthyroidism).  Other rare problems of the nerves (neurological disorders) or blood disorders. In some cases, the cause may not be known. What increases the risk? The following factors may make you more likely to develop this condition:  Chronic hypertension. In some cases, this may not have been diagnosed before pregnancy.  Obesity.  Type 2 diabetes.  Kidney disease.  History of preeclampsia or  eclampsia.  Other medical conditions that change the level of hormones in the body (hormonal imbalance). What are the signs or symptoms? As with all types of hypertension, postpartum hypertension may not have any symptoms. Depending on how high your blood pressure is, you may experience:  Headaches. These may be mild, moderate, or severe. They may also be steady, constant, or sudden in onset (thunderclap headache).  Changes in your ability to see (visual changes).  Dizziness.  Shortness of breath.  Swelling of your hands, feet, lower legs, or face. In some cases, you may have swelling in more than one of these locations.  Heart palpitations or a racing heartbeat.  Difficulty breathing while lying down.  Decrease in the amount of urine that you pass. Other rare signs and symptoms may include:  Sweating more than usual. This lasts longer than a few days after delivery.  Chest pain.  Sudden dizziness when you get up from sitting or lying down.  Seizures.  Nausea or vomiting.  Abdominal pain. How is this diagnosed? This condition may be diagnosed based on the results of a physical exam, blood pressure measurements, and blood and urine tests. You may also have other tests, such as a CT scan or an MRI, to check for other problems of postpartum hypertension. How is this treated? If blood pressure is high enough to require treatment, your options may include:  Medicines to reduce blood pressure (antihypertensives). Tell your health care provider if you are breastfeeding or if you plan to breastfeed. There are many antihypertensive medicines that are safe to take while breastfeeding.  Stopping medicines that may be causing hypertension.  Treating medical conditions that are causing hypertension.  Treating the complications of hypertension, such as seizures, stroke, or kidney problems. Your health care provider will also continue to monitor your blood pressure closely until it is  within a safe range for you. Follow these instructions at home:  Take over-the-counter and prescription medicines only as told by your health care provider.  Return to your normal activities as told by your health care provider. Ask your health care provider what activities are safe for you.  Do not use any products that contain nicotine or tobacco, such as cigarettes and e-cigarettes. If you need help quitting, ask your health care provider.  Keep all follow-up visits as told by your health care provider. This is important. Contact a health care provider if:  Your symptoms get worse.  You have new symptoms, such as: ? A headache that does not get better. ? Dizziness. ? Visual changes. Get help right away if:  You suddenly develop swelling in your hands, ankles, or face.  You have sudden, rapid weight gain.  You develop difficulty breathing, chest pain, racing heartbeat, or heart palpitations. °· You develop severe pain in your abdomen. °· You have any symptoms of a stroke. "BE FAST" is an easy way to remember the main warning signs of a stroke: °? B - Balance. Signs are dizziness, sudden trouble walking, or loss of balance. °? E - Eyes. Signs are trouble seeing or a sudden change in vision. °? F - Face. Signs are sudden weakness or numbness of the face, or the face or eyelid drooping on one side. °? A - Arms. Signs are weakness or numbness in an arm. This happens suddenly and usually on one side of the body. °? S - Speech. Signs are sudden trouble speaking, slurred speech, or trouble understanding what people say. °? T - Time. Time to call emergency services. Write down what time symptoms started. °· You have other signs of a stroke, such as: °? A sudden, severe headache with no known cause. °? Nausea or vomiting. °? Seizure. °These symptoms may represent a serious problem that is an emergency. Do not wait to see if the symptoms will go away. Get medical help right away. Call your local  emergency services (911 in the [place] Do not drive yourself to the hospital. °Summary °· Postpartum hypertension is high blood pressure that remains higher than normal after childbirth. °· In most cases, postpartum hypertension will go away on its own, usually within a week of delivery. °· For some women, medical treatment is required to prevent serious complications, such as seizures or stroke. °This information is not intended to replace advice given to you by your health care provider. Make sure you discuss any questions you have with your health care provider. °Document Released: 01/08/2014 Document Revised: 06/13/2018 Document Reviewed: 02/25/2017 °Elsevier Patient Education © 2020 Elsevier Inc. ° ° ° °

## 2019-02-11 ENCOUNTER — Other Ambulatory Visit: Payer: Medicaid Other

## 2019-02-11 ENCOUNTER — Encounter: Payer: Medicaid Other | Admitting: Obstetrics and Gynecology

## 2019-02-17 ENCOUNTER — Other Ambulatory Visit: Payer: Self-pay

## 2019-02-17 ENCOUNTER — Ambulatory Visit (INDEPENDENT_AMBULATORY_CARE_PROVIDER_SITE_OTHER): Payer: Medicaid Other | Admitting: Emergency Medicine

## 2019-02-17 VITALS — Wt 332.7 lb

## 2019-02-17 DIAGNOSIS — Z5189 Encounter for other specified aftercare: Secondary | ICD-10-CM

## 2019-02-17 MED ORDER — CEPHALEXIN 500 MG PO CAPS: 500.0000 mg | ORAL_CAPSULE | Freq: Four times a day (QID) | ORAL | 0 refills | Status: DC

## 2019-02-17 NOTE — Progress Notes (Signed)
Pt here today for incision check. Pt reports having pain and drainage at the incision site. Pt also complains of incision having an odor. Upon assessment, there is a cloudy drainage noted with an odor present. Curt Bears, CNM at the bedside. Pt was BP 158/98 and second BP 145/88. Pt reports taking the prescribed 5 mg vasotec as directed and stated her blood pressure is usually normal at home. Per Curt Bears pt should continue to monitor her blood pressure at home and take her medication as directed and go to [facility] if she has another high blood pressure. Pt denies changes in vision and denies headaches. Pt incision was cleaned with normal saline and pt was prescribed 500 mg Keflex QID for 10 days. Pt was also instructed to continue to let warm soapy water wash over incision and pat dry. Pt verbalized understanding and had no further questions or concerns.

## 2019-02-18 NOTE — Progress Notes (Signed)
-  Incision looks well healed, only a small (less than one cm) area with redness and discharge; slight odor . No streaking or redness, although patient reports it is very tenderness with movement. This tenderness started two days ago, her surgery was 2 weeks ago.  -will presume staph infection and treat with antibiotics.  -CMA to order RX for keflex; return precautions reviewed.

## 2019-03-02 ENCOUNTER — Ambulatory Visit (INDEPENDENT_AMBULATORY_CARE_PROVIDER_SITE_OTHER): Payer: Medicaid Other | Admitting: Obstetrics and Gynecology

## 2019-03-02 ENCOUNTER — Encounter: Payer: Self-pay | Admitting: Obstetrics and Gynecology

## 2019-03-02 ENCOUNTER — Other Ambulatory Visit: Payer: Self-pay

## 2019-03-02 DIAGNOSIS — Z1389 Encounter for screening for other disorder: Secondary | ICD-10-CM

## 2019-03-02 MED ORDER — NORETHINDRONE 0.35 MG PO TABS: 1.0000 | ORAL_TABLET | Freq: Every day | ORAL | 3 refills | Status: DC

## 2019-03-02 NOTE — Progress Notes (Signed)
Obstetrics and Gynecology Postpartum Visit  Appointment Date: 03/02/2019  OB[facility]N Clinic: [facility]  Primary Care Provider: Patient, No Pcp Per  Chief Complaint:  Chief Complaint  Patient presents with  . Postpartum Care    History of Present Illness: Terri Tran is a 28 y.o. African-American [phone] (Patient's last menstrual period was 05/05/2018 (exact date).), seen for the above chief complaint. Her past medical history is significant for BMI 50s, cHTN   She is s/p rLTCS on 9/14; she was discharged to home on POD#3  Vaginal bleeding or discharge: No  Breast or formula feeding: formula Intercourse: No  Contraception after delivery: abstinence PP depression s/s: No  Any bowel or bladder issues: No  Pap smear: out of date  She was started on keflex on 9/29 for incisional cellulitis and she is almost done with the 10d course. Patient having some hemorrhoid discomfort and declines pelvic exam today.   Review of Systems:  as noted in the History of Present Illness.  Patient Active Problem List   Diagnosis Date Noted  . Delivered by cesarean delivery following previous cesarean delivery 02/02/2019  . Abnormal genetic test 10/01/2018  . GBS bacteriuria 09/13/2018  . Supervision of high risk pregnancy, antepartum 07/29/2018  . Previous cesarean delivery affecting pregnancy 07/29/2018  . Chronic hypertension affecting pregnancy 08/31/2016  . Obesity in pregnancy 08/28/2016  . BMI 50.0-59.9, adult ([place]) 08/28/2016    Medications Terri Grippe "[name]" had no medications administered during this visit. Current Outpatient Medications  Medication Sig Dispense Refill  . cephALEXin (KEFLEX) 500 MG capsule Take 1 capsule (500 mg total) by mouth 4 (four) times daily. 40 capsule 0  . enalapril (VASOTEC) 5 MG tablet Take 1 tablet (5 mg total) by mouth daily. 30 tablet 1  . Prenatal Vit-Fe Fumarate-FA (PRENATAL MULTIVITAMIN) TABS tablet Take 1 tablet by mouth  daily.     Marland Kitchen aspirin EC 81 MG tablet Take 1 tablet (81 mg total) by mouth daily. (Patient not taking: Reported on 02/17/2019) 30 tablet 0  . ibuprofen (ADVIL) 800 MG tablet Take 1 tablet (800 mg total) by mouth every 6 (six) hours. 30 tablet 0  . norethindrone (CAMILA) 0.35 MG tablet Take 1 tablet (0.35 mg total) by mouth daily. 3 Package 3  . oxyCODONE-acetaminophen (PERCOCET/ROXICET) 5-325 MG tablet Take 1-2 tablets by mouth every 4 (four) hours as needed for moderate pain. 30 tablet 0  . senna-docusate (SENOKOT-S) 8.6-50 MG tablet Take 2 tablets by mouth daily. (Patient not taking: Reported on 02/17/2019)     No current facility-administered medications for this visit.     Allergies Patient has no known allergies.  Physical Exam:  BP (!) 131/92   Pulse 87   Temp 98.3 F (36.8 C)   Wt (!) 332 lb 1.6 oz (150.6 kg)   LMP 05/05/2018 (Exact Date)   BMI 50.50 kg/m  Body mass index is 50.5 kg/m. General appearance: Well nourished, well developed female in no acute distress.  Respiratory: Normal respiratory effort Abdomen:obese, c/d/i incision that is underneath the pannus except two subcm spots where the skin is slightly open. No erythema and slight yellow non malodorous discharge.  Neuro/Psych:  Normal mood and affect.  Skin:  Warm and dry.   Laboratory: none  PP Depression Screening:  EPDS score zereo  Assessment: pt stable  Plan:  Pt would like OCPs. All options d/w her and I told her I don't recommend COCs and only eligible for POPs. D/w her re: proper  use with them and 3 hour time interval. Pt would like to do these  Patient to do pap with annual in two months; she states she has regular medicaid  Patient confirms she's taking her ace inhibitor and hasn't called yet to establish care for a pcp. Importance of this stressed with her and I told her to continue the ace until she establishes care.   RTC two months for annual and pap  Cornelia Copa MD Attending Center for  [facility])

## 2019-03-27 ENCOUNTER — Telehealth: Payer: Medicaid Other

## 2019-03-27 ENCOUNTER — Encounter (HOSPITAL_COMMUNITY): Payer: Self-pay

## 2019-05-19 ENCOUNTER — Inpatient Hospital Stay: Admission: RE | Admit: 2019-05-19 | Payer: Medicaid Other | Source: Ambulatory Visit

## 2019-05-20 ENCOUNTER — Ambulatory Visit: Payer: Medicaid Other | Admitting: Obstetrics and Gynecology

## 2019-05-28 ENCOUNTER — Ambulatory Visit (INDEPENDENT_AMBULATORY_CARE_PROVIDER_SITE_OTHER)
Admission: RE | Admit: 2019-05-28 | Discharge: 2019-05-28 | Disposition: A | Discharge status: Home / Self Care | Payer: Medicaid Other | Source: Ambulatory Visit

## 2019-05-28 DIAGNOSIS — R300 Dysuria: Secondary | ICD-10-CM

## 2019-05-28 MED ORDER — CEPHALEXIN 500 MG PO CAPS: 500.0000 mg | ORAL_CAPSULE | Freq: Two times a day (BID) | ORAL | 0 refills | Status: AC

## 2019-05-28 NOTE — Discharge Instructions (Signed)
Take the antibiotic as directed.    Follow up with your primary care provider or come here to be seen in person if your symptoms are not improving.    

## 2019-05-28 NOTE — ED Provider Notes (Signed)
Virtual Visit via Video Note:  Terri Tran  initiated request for Telemedicine visit with [facility] team. I connected with Terri Tran  on 05/28/2019 at 10:05 AM  for a synchronized telemedicine visit using a video enabled HIPPA compliant telemedicine application. I verified that I am speaking with Terri Tran  using two identifiers. Terri Balloon, NP  was physically located in a [facility] care site and Terri Tran was located at a different location.   The limitations of evaluation and management by telemedicine as well as the availability of in-person appointments were discussed. Patient was informed that she  may incur a bill ( including co-pay) for this virtual visit encounter. Terri Tran  expressed understanding and gave verbal consent to proceed with virtual visit.     History of Present Illness:Terri Tran  is a 29 y.o. female presents for evaluation of 1 week history of urinary frequency, dysuria, bladder pressure, cloudy urine.  She denies fever, chills, abdominal pain, back pain, vaginal discharge, pelvic pain.  Attempted treatment at home with OTC Azo.  She denies pregnancy or breastfeeding.     No Known Allergies   Past Medical History:  Diagnosis Date  . Abnormal genetic test 10/01/2018   SMA carrier Rec Fob get tested  . Anemia affecting first pregnancy 2013   was taking Iron supplements  . Depression 2013   postpartum depression after first delivery; was prescribed Xanax  . GBS bacteriuria 09/13/2018  . GERD (gastroesophageal reflux disease)    during pregnancy only; takes Tums; helps  . Hypertension   . Pregnancy induced hypertension      Social History   Tobacco Use  . Smoking status: Former Smoker    Packs/day: 0.50    Types: Cigarettes    Quit date: 07/21/2018    Years since quitting: 0.8  . Smokeless tobacco: Never Used  Substance Use Topics  . Alcohol use: No    Alcohol/week: 7.0 standard drinks    Types: 7 Cans of beer  per week  . Drug use: No        Observations/Objective: Physical Exam  VITALS: Patient denies fever. GENERAL: Alert, appears well and in no acute distress. HEENT: Atraumatic. NECK: Normal movements of the head and neck. CARDIOPULMONARY: No increased WOB. Speaking in clear sentences. I:E ratio WNL.  MS: Moves all visible extremities without noticeable abnormality. PSYCH: Pleasant and cooperative, well-groomed. Speech normal rate and rhythm. Affect is appropriate. Insight and judgement are appropriate. Attention is focused, linear, and appropriate.  NEURO: CN grossly intact. Oriented as arrived to appointment on time with no prompting. Moves both UE equally.  SKIN: No obvious lesions, wounds, erythema, or cyanosis noted on face or hands.   Assessment and Plan:    ICD-10-CM   1. Dysuria  R30.0        Follow Up Instructions: Treating with Keflex.  UTI instructions provided through [facility].  Instructed patient to follow-up with her PCP or come here to be seen in person if her symptoms are not improving.  Patient agrees to plan of care.      I discussed the assessment and treatment plan with the patient. The patient was provided an opportunity to ask questions and all were answered. The patient agreed with the plan and demonstrated an understanding of the instructions.   The patient was advised to call back or seek an in-person evaluation if the symptoms worsen or if the condition fails to improve  as anticipated.      Mickie Bail, NP  05/28/2019 10:05 AM         Mickie Bail, NP 05/28/19 1005

## 2019-07-15 ENCOUNTER — Telehealth: Payer: Self-pay | Admitting: Obstetrics & Gynecology

## 2019-07-15 NOTE — Telephone Encounter (Signed)
Called the patient to inform of the scheduling of the annual appointment requested on the wait list. The patient stated she will not be back in town until the very end of March. Dr. Vergie Living does not have any available appointments at that time. The patient stated she will wait until April to schedule the appointment. Placed back on the waitlist.

## 2019-08-23 ENCOUNTER — Ambulatory Visit (INDEPENDENT_AMBULATORY_CARE_PROVIDER_SITE_OTHER)
Admission: RE | Admit: 2019-08-23 | Discharge: 2019-08-23 | Disposition: A | Discharge status: Home / Self Care | Payer: Medicaid Other | Source: Ambulatory Visit

## 2019-08-23 DIAGNOSIS — L089 Local infection of the skin and subcutaneous tissue, unspecified: Secondary | ICD-10-CM | POA: Diagnosis not present

## 2019-08-23 DIAGNOSIS — T148XXA Other injury of unspecified body region, initial encounter: Secondary | ICD-10-CM | POA: Diagnosis not present

## 2019-08-23 MED ORDER — IBUPROFEN 800 MG PO TABS: 800.0000 mg | ORAL_TABLET | Freq: Three times a day (TID) | ORAL | 0 refills | Status: DC

## 2019-08-23 MED ORDER — CEPHALEXIN 500 MG PO CAPS: 500.0000 mg | ORAL_CAPSULE | Freq: Four times a day (QID) | ORAL | 0 refills | Status: AC

## 2019-08-23 NOTE — ED Provider Notes (Addendum)
Virtual Visit via Video Note:  Terri Tran  initiated request for Telemedicine visit with [facility] team. I connected with Terri Tran  on 08/23/2019 at 11:29 AM  for a synchronized telemedicine visit using a video enabled HIPPA compliant telemedicine application. I verified that I am speaking with Terri Tran  using two identifiers. Terri Krone Tran Brittanni Cariker, PA-Tran  was physically located in a [facility] site and Terri Tran was located at a different location.   The limitations of evaluation and management by telemedicine as well as the availability of in-person appointments were discussed. Patient was informed that she  may incur a bill ( including co-pay) for this virtual visit encounter. Terri Tran  expressed understanding and gave verbal consent to proceed with virtual visit.     History of Present Illness:Terri Tran  is a 29 y.o. female presents for evaluation of possible infection to Tran-section wound.  Patient had Tran-section in September 2020.  Wound fully healed.  Notes that recently she was attempting to do some spring cleaning and was doing more heavy lifting than normal.  She believes that wound opened slightly and has been leaking foul-smelling drainage.  She has had associated pain with this.  Denies fevers.  Denies nausea or vomiting.  Has plans to follow-up with OB/GYN later this month.  No Known Allergies   Past Medical History:  Diagnosis Date  . Abnormal genetic test 10/01/2018   SMA carrier Rec Fob get tested  . Anemia affecting first pregnancy 2013   was taking Iron supplements  . Depression 2013   postpartum depression after first delivery; was prescribed Xanax  . GBS bacteriuria 09/13/2018  . GERD (gastroesophageal reflux disease)    during pregnancy only; takes Tums; helps  . Hypertension   . Pregnancy induced hypertension      Social History   Tobacco Use  . Smoking status: Former Smoker    Packs/day: 0.50    Types: Cigarettes     Quit date: 07/21/2018    Years since quitting: 1.0  . Smokeless tobacco: Never Used  Substance Use Topics  . Alcohol use: No    Alcohol/week: 7.0 standard drinks    Types: 7 Cans of beer per week  . Drug use: No        Observations/Objective: Physical Exam  Constitutional: She is oriented to person, place, and time and well-developed, well-nourished, and in no distress. No distress.  Lying in bed  HENT:  Head: Normocephalic and atraumatic.  Pulmonary/Chest: Effort normal. No respiratory distress.  Speaking full sentences  Abdominal:  Difficult to ascertain specific details of wound, but she has area beneath lower fold of abdomen, appears slightly erythematous  Neurological: She is alert and oriented to person, place, and time.  Speech clear, face symmetric     Assessment and Plan:    ICD-10-CM   1. Wound infection  T14.8XXA    L08.9      Concern for possible wound infection/cellulitis in his lower abdominal area, recommended to let this area air out, keep clean and dry, will initiate on Keflex, Tylenol and ibuprofen for pain.  Recommended to follow-up in person if symptoms progressing or worsening for better visualization of wound/area, not improving with antibiotic and to follow-up with OB/GYN.  Follow Up Instructions:     I discussed the assessment and treatment plan with the patient. The patient was provided an opportunity to ask questions and all were answered. The patient agreed  with the plan and demonstrated an understanding of the instructions.   The patient was advised to call back or seek an in-person evaluation if the symptoms worsen or if the condition fails to improve as anticipated.      Terri Dawes, PA-Tran  08/23/2019 11:29 AM         Terri Dawes, PA-Tran 08/23/19 1130    Terri Tran, Terri C, PA-Tran 08/23/19 1130

## 2019-09-14 ENCOUNTER — Encounter: Payer: Self-pay | Admitting: Obstetrics and Gynecology

## 2019-09-14 ENCOUNTER — Other Ambulatory Visit: Payer: Self-pay

## 2019-09-14 ENCOUNTER — Other Ambulatory Visit (HOSPITAL_COMMUNITY)
Admission: RE | Admit: 2019-09-14 | Discharge: 2019-09-14 | Disposition: A | Discharge status: Home / Self Care | Payer: Medicaid Other | Source: Ambulatory Visit | Attending: Obstetrics and Gynecology | Admitting: Obstetrics and Gynecology

## 2019-09-14 ENCOUNTER — Ambulatory Visit (INDEPENDENT_AMBULATORY_CARE_PROVIDER_SITE_OTHER): Payer: Medicaid Other | Admitting: Obstetrics and Gynecology

## 2019-09-14 VITALS — BP 109/86 | HR 77 | Wt 369.9 lb

## 2019-09-14 DIAGNOSIS — Z01419 Encounter for gynecological examination (general) (routine) without abnormal findings: Secondary | ICD-10-CM | POA: Diagnosis not present

## 2019-09-14 DIAGNOSIS — Z658 Other specified problems related to psychosocial circumstances: Secondary | ICD-10-CM

## 2019-09-14 DIAGNOSIS — I10 Essential (primary) hypertension: Secondary | ICD-10-CM

## 2019-09-14 DIAGNOSIS — Z0000 Encounter for general adult medical examination without abnormal findings: Secondary | ICD-10-CM | POA: Diagnosis not present

## 2019-09-14 DIAGNOSIS — Z6841 Body Mass Index (BMI) 40.0 and over, adult: Secondary | ICD-10-CM

## 2019-09-14 DIAGNOSIS — Z793 Long term (current) use of hormonal contraceptives: Secondary | ICD-10-CM

## 2019-09-14 DIAGNOSIS — Z1151 Encounter for screening for human papillomavirus (HPV): Secondary | ICD-10-CM

## 2019-09-14 MED ORDER — NORETHINDRONE 0.35 MG PO TABS: 1.0000 | ORAL_TABLET | Freq: Every day | ORAL | 3 refills | Status: DC

## 2019-09-14 NOTE — BH Specialist Note (Signed)
Integrated Behavioral Health via Telemedicine Video Visit  09/14/2019 Terri Tran [id number]  Number of Integrated Behavioral Health visits: 1 Session Start time: 3:20  Session End time: 4:02 Total time: 68  Referring Provider: [place] Bing, MD Type of Visit: Video Patient/Family location[facility] Provider location: [facility] All persons participating in visit: Patient Terri Tran and Terri Tran Terri Tran    Confirmed patient's address: Yes  Confirmed patient's phone number: Yes  Any changes to demographics: No   Confirmed patient's insurance: Yes  Any changes to patient's insurance: No   Discussed confidentiality: Yes   I connected with Terri Tran by a video enabled telemedicine application and verified that I am speaking with the correct person using two identifiers.     I discussed the limitations of evaluation and management by telemedicine and the availability of in person appointments.  I discussed that the purpose of this visit is to provide behavioral health Tran while limiting exposure to the novel coronavirus.   Discussed there is a possibility of technology failure and discussed alternative modes of communication if that failure occurs.  I discussed that engaging in this video visit, they consent to the provision of behavioral healthcare and the services will be billed under their insurance.  Patient and/or legal guardian expressed understanding and consented to video visit: Yes   PRESENTING CONCERNS: Patient and/or family reports the following symptoms/concerns: Pt states her primary concern today is feeling anxious, "all day crying nonstop" and panic attack yesterday, poor appetite, restlessness, irritability, insomnia (has gone up to 4-5 days with no sleep in the past); family history of bipolar disorder (mother) and personal history of childhood trauma. Pt is open to psychiatry referral Duration of problem: Increase over time; Severity of problem:  moderately severe  STRENGTHS (Protective Factors/Coping Skills): Resilience and self-awareness  GOALS ADDRESSED: Patient will: 1.  Reduce symptoms of: anxiety, depression and mood instability  2.  Increase knowledge and/or ability of: stress reduction  3.  Demonstrate ability to: Increase healthy adjustment to current life circumstances and Increase motivation to adhere to plan of Tran  INTERVENTIONS: Interventions utilized:  Mindfulness or Management consultant, Psychoeducation and/or Health Education and Link to [facility] Standardized Assessments completed: MDQ, GAD-7 and PHQ 9  ASSESSMENT: Patient currently experiencing Mood disorder. .   Patient may benefit from psychoeducation and brief therapeutic interventions regarding coping with symptoms of anxiety and depression, as well as referral to psychiatry for further assessment and BH medication management. Marland Kitchen  PLAN: 1. Follow up with behavioral health clinician on : Two weeks. Call Terri Tran at [phone] if needed prior to that visit.  2. Behavioral recommendations:  -Make sure voicemail is set up and not full, to receive call to set up initial appointment with psychiatry -Read educational materials regarding coping with symptoms of panic attacks -Consider trying out apps this week, as discussed, for own self-Tran time daily -Be familiar with additional behavioral health resources, including mobile crisis number, as needed -Call PCP offices (on After Visit Summary) to set up initial Primary Tran Provider appointment   3. Referral(s): Integrated Art gallery manager (In Clinic) and [facility] (LME/Outside Clinic)  I discussed the assessment and treatment plan with the patient and/or parent/guardian. They were provided an opportunity to ask questions and all were answered. They agreed with the plan and demonstrated an understanding of the instructions.   They were advised to call back or seek an  in-person evaluation if the symptoms worsen or if the condition  fails to improve as anticipated.  Terri Tran  Depression screen [facility] 2/9 09/14/2019 07/29/2018 08/28/2016 08/02/2016 06/27/2016  Decreased Interest 1 1 0 0 0  Down, Depressed, Hopeless 1 1 0 0 0  PHQ - 2 Score 2 2 0 0 0  Altered sleeping 0 1 0 0 0  Tired, decreased energy 1 1 0 0 0  Change in appetite 2 0 0 0 0  Feeling bad or failure about yourself  0 0 0 0 0  Trouble concentrating 0 0 0 0 0  Moving slowly or fidgety/restless 0 0 0 0 0  Suicidal thoughts 0 0 0 0 0  PHQ-9 Score 5 4 0 0 0   GAD 7 : Generalized Anxiety Score 09/14/2019 07/29/2018 08/28/2016 08/02/2016  Nervous, Anxious, on Edge 3 1 0 0  Control/stop worrying 2 0 0 0  Worry too much - different things 1 0 0 0  Trouble relaxing 0 0 0 0  Restless 0 0 0 0  Easily annoyed or irritable 3 0 0 0  Afraid - awful might happen 2 0 0 0  Total GAD 7 Score 11 1 0 0

## 2019-09-14 NOTE — Patient Instructions (Signed)
[employer]  Department of Social Services[place] [address], [place], [place] [address] [phone]) [phone]   or  www.[place].gov/[place]-c[place]/human-services/social-services **SNAP/EBT/ Other nutritional benefits  [place] DHHS-Public Health-WIC [address], [place], [place] [address] [phone]) [phone]  or  https://[place].gov/[place]-county/human-services/health-department **WIC for  women who are pregnant and postpartum, infants and children up to 5 years old  [facility] [address], [place], [place] [address] [phone]) [phone]   or   www.theblessedtable.org  **Food pantry  [facility] [address], [place], [place] [address] [phone]   or   https://brotherkolbes.godaddysites.com  **Emergency food and prepared meals  [facility] [address], [place], [place] [address] [phone]) [phone]   or   www.cedargrovetop.us **Food pantry  [facility] [address], [place], [place] [address] [phone]) [phone]   or   www.facebook.com/[facility]-116430931718202 **Food pantry  [facility] [address], [place], [place] [address] [phone]) [phone] **Food pantry  [facility] [facility] [address], [place], [place] [address] [phone]) [phone]   or   www.greensborourbanministry.org  **Food pantry and prepared meals  [facility] [address], [place], [place] [address] https://jfsgreensboro.org/  **Food pantry  [facility] [address], [place], [place] [address] [phone]) [phone]   or   www.lbcnow.org  **Food pantry  [facility] [address], [place], [place] [address] [phone]) [phone]   or   http://www.onestepfurther.com **Food pantry, nutrition education, gardening activities  [facility] [address], [place], [place] [address] [phone]) [phone] **Food pantry  [facility] [place] [address], [place], [place] [address] [phone]) [phone]   or   www.salvationarmyofgreensboro.org **Food pantry  [facility] [place] [address], [place], [place] [address] [phone]) [phone]   or   http://senior-resources-[place]il[place].org **Meals on Wheels Program  [facility] [address], [place], [place] [address] [phone]) [phone]   or   www.stmattchurch.com  **Food pantry  [facility] [address], [place], [place] [address] [phone]   or   vandaliapresbyterianchurch.org **Food pantry  [place] [employer] [facility]  [place] [facility] [address], [place], [place] [address] [phone]) [phone]   or   www.facebook.com/JulianUMC Food pantry  [facility] [address], [place], [place] [address] [phone]) [phone] **Food pantry  [place] [facility]   Department of [place] Health[place] [address], [place], [place] [address] [phone]) [phone]   or   www.co.[place].us/ph/  [place] DHHS-Public Health-WIC ([place]) [address], [place], [place] [address] [phone]) [phone]   or   https://www.[place]nc.gov/our-c[place]/human-services/health-department **WIC for pregnant and postpartum women, infants and children up to 5 years old  [facility] [address], [place], [place] [address] [phone]) [phone] **Food pantry  [facility] [address], [place], [place] [address] [phone]) [phone]   or   emerywoodbaptistchurch.com *Food pantry  [employer] [facility] [address], [place], [place] [address] [phone]) [phone]   or   www.fcch[place].org **Food pantry  [facility] [address], [place], [place] [address] [phone]) [phone]   or    www.helpinghandshp.org **Food pantry  [facility] [employer] [facility] [address], [place], [place] [address] [phone]) [phone]   or   www.facebook.com/KBCI1 **Food Pantry  [employer] [facility] [address], [place], [place] [address] [phone]) [phone]   or   www.abbottscreek.org **Food pantry  [facility] [place] [phone])[phone]   **Delivers meals  [facility] [address], [place], [place] [address] [phone]) [phone]   or   nbfgm.sundaystreamwebsites.com  **Food pantry  [facility] [place] [address], [place], [place] [address] [phone]) [phone]   or   www.odm-hp.org  **Food pantry  [facility] [address], [place], [place] [address] [phone]) [phone]   or   R2live.tv **Food pantry  [facility] [address], [place], [place] [address] [phone]) [phone]   or   www.salvationarmycarolinas.org/[place] **Emergency food and pet food  [facility] [address], [place], [place] [address] [phone])[phone]   or   www.senioradults.org **Congregate and delivered meals to older adults  [facility] [place] [address], [place], [place] [address] [phone]) [phone]   or   www.unitedwayhp.org **Back Pack Program for elementary school students  [facility] [address], [place], [place] [address] [phone]) [phone]   or   www.wardstreetcommunityresources.org **Food pantry  [facility] [address], [place], [place] [address] [phone]) [phone]   or   www.ywcahp.com **Emergency food, nutrition classes, food budgeting  [facility] [place]  Department of [place]  [address], [place], [place] [address] [phone]) [phone]  or   www.co.[place].us/pview.aspx?id=14850&catid=407 **SNAP/Other nutrition benefits  [place] Department of Health and Human Services [address], [place], [place] [address] [phone]   or  https://www.rockinghamcountydhhs.org/  **SNAP/Other nutrition benefits  [place] Department of Public Health-WIC & Nutrition Services [address], [place], [place] [address] [phone]) [phone]  or  http://www.[place]countypublichealth.org **WIC for pregnant and postpartum women, infants and children up to 5 years old  Aging, Disability and Transit Services[place] [address], [place], [place] [address] [phone]) [phone]  or www.adtsrc.org **Prepared meals for older adults  [facility] [address], [place], [place] [address] [phone]) [phone]  or  www.calvary4you.com **Food pantry  [facility] [address], [place], [place] [address] [phone]) [phone]   or   https://www.handsofgod.org/  **Food pantry  [facility] [address], [place], [place] [address] [phone]) [phone] **Food pantry  [facility] [address], [place], [place] [address] [phone]) [phone]   or   www.ci.[place].us/government/parks_and_recreation/senior_center/index.php **Congregate meal for older adults  [facility] [address], [place], [place] [address] [phone]) [phone]   or www.reidsvilleoutreachcenter.org  **Food pantry  [facility] [address], [place], [place] [address] [phone]) [phone]   or   www.salvationarmycarolinas.org/commands/[place] **Food pantry    

## 2019-09-14 NOTE — Progress Notes (Signed)
Obstetrics and Gynecology Annual Patient Evaluation  Appointment Date: 09/14/2019  OBGYN Clinic: [facility]   Primary Care Provider: Patient, No Pcp Per  Referring Provider: No ref. provider found  Chief Complaint:  Chief Complaint  Patient presents with  . Gynecologic Exam    History of Present Illness: Terri Tran is a 29 y.o. African-American [phone] (Patient's last menstrual period was 08/22/2019.), seen for the above chief complaint. Her past medical history is significant for BMI 50s, cHTN   Review of Systems: A comprehensive review of systems was negative.    Past Medical History:  Past Medical History:  Diagnosis Date  . Abnormal genetic test 10/01/2018   SMA carrier Rec Fob get tested  . Anemia affecting first pregnancy 2013   was taking Iron supplements  . Depression 2013   postpartum depression after first delivery; was prescribed Xanax  . GBS bacteriuria 09/13/2018  . GERD (gastroesophageal reflux disease)    during pregnancy only; takes Tums; helps  . Hypertension   . Pregnancy induced hypertension     Past Surgical History:  Past Surgical History:  Procedure Laterality Date  . CESAREAN SECTION    . CESAREAN SECTION N/A 09/02/2016   Procedure: CESAREAN SECTION;  Surgeon: Terri Dukes, MD;  Location: [facility] BIRTHING SUITES;  Service: Obstetrics;  Laterality: N/A;  . CESAREAN SECTION N/A 02/02/2019   Procedure: CESAREAN SECTION;  Surgeon: Terri Heritage, DO;  Location: [facility] LD ORS;  Service: Obstetrics;  Laterality: N/A;    Past Obstetrical History:  OB History  Gravida Para Term Preterm AB Living  3 3 3  0 0 3  SAB TAB Ectopic Multiple Live Births  0 0 0 0 3    # Outcome Date GA Lbr Len/2nd Weight Sex Delivery Anes PTL Lv  3 Term 02/02/19 [redacted]w[redacted]d  5 lb 9.2 oz (2.53 kg) F CS-LTranv Spinal  LIV  2 Term 09/02/16 [redacted]w[redacted]d  6 lb 13.9 oz (3.115 kg) M CS-LTranv EPI  LIV  1 Term 08/08/11 [redacted]w[redacted]d   F CS-LTranv Spinal N LIV    Past  Gynecological History: As per HPI. Periods: qmonth, regular She is currently using progestin only pills for contraception.   Social History:  Social History   Socioeconomic History  . Marital status: Single    Spouse name: Not on file  . Number of children: Not on file  . Years of education: Not on file  . Highest education level: Not on file  Occupational History  . Not on file  Tobacco Use  . Smoking status: Former Smoker    Packs/day: 0.50    Types: Cigarettes    Quit date: 07/21/2018    Years since quitting: 1.1  . Smokeless tobacco: Never Used  Substance and Sexual Activity  . Alcohol use: No    Alcohol/week: 7.0 standard drinks    Types: 7 Cans of beer per week  . Drug use: No  . Sexual activity: Yes    Birth control/protection: None  Other Topics Concern  . Not on file  Social History Narrative  . Not on file   Social Determinants of Health   Financial Resource Strain:   . Difficulty of Paying Living Expenses:   Food Insecurity: Food Insecurity Present  . Worried About 09/20/2018 in the Last Year: Sometimes true  . Ran Out of Food in the Last Year: Sometimes true  Transportation Needs: No Transportation Needs  . Lack of Transportation (Medical): No  . Lack of  Transportation (Non-Medical): No  Physical Activity:   . Days of Exercise per Week:   . Minutes of Exercise per Session:   Stress:   . Feeling of Stress :   Social Connections:   . Frequency of Communication with Friends and Family:   . Frequency of Social Gatherings with Friends and Family:   . Attends Religious Services:   . Active Member of Clubs or Organizations:   . Attends Archivist Meetings:   Terri Tran Kitchen Marital Status:   Intimate Partner Violence:   . Fear of Current or Ex-Partner:   . Emotionally Abused:   Terri Tran Kitchen Physically Abused:   . Sexually Abused:     Family History:  Family History  Problem Relation Age of Onset  . Hypertension Mother     Medications Terri Tran  "Terri Tran" had no medications administered during this visit. Current Outpatient Medications  Medication Sig Dispense Refill  . ibuprofen (ADVIL) 800 MG tablet Take 1 tablet (800 mg total) by mouth 3 (three) times daily. 21 tablet 0  . norethindrone (CAMILA) 0.35 MG tablet Take 1 tablet (0.35 mg total) by mouth daily. 3 Package 3   No current facility-administered medications for this visit.    Allergies Patient has no known allergies.   Physical Exam:  BP 109/86   Pulse 77   Wt (!) 369 lb 14.4 oz (167.8 kg)   LMP 08/22/2019   Breastfeeding No   BMI 56.24 kg/m  Body mass index is 56.24 kg/m. General appearance: Well nourished, well developed female in no acute distress.  Neck:  Supple, normal appearance, and no thyromegaly  Cardiovascular: normal s1 and s2.  No murmurs, rubs or gallops. Respiratory:  Clear to auscultation bilateral. Normal respiratory effort Abdomen: positive bowel sounds and no masses, hernias; diffusely non tender to palpation, non distended Breasts: breasts appear normal, no suspicious masses, no skin or nipple changes or axillary nodes, and normal palpation. Neuro/Psych:  Normal mood and affect.  Skin:  Warm and dry.  Lymphatic:  No inguinal lymphadenopathy.   Pelvic exam: is not limited by body habitus EGBUS: within normal limits Vagina: within normal limits and with no blood or discharge in the vault Cervix: normal appearing cervix without tenderness, discharge or lesions.  Uterus:  nonenlarged and non tender Adnexa:  normal adnexa and no mass, fullness, tenderness Rectovaginal: deferred  Laboratory: none  Radiology: none  Assessment: pt doing well  Plan:  1. Well woman exam with routine gynecological exam Routine care. Pt likes POPs, refill given.  - Cytology - PAP( Terri Tran) - TSH  2. Psychosocial stressors TSH - Ambulatory referral to [place]  3. Benign essential HTN Pt stopped enalapril several months ago. BP  normal today. Has cuff at home and will periodically check  4. BMI 50.0-59.9, adult [facility]) Had recent surveillance labs during pregnancy.   Orders Placed This Encounter  Procedures  . TSH  . Ambulatory referral to [place]    RTC PRN  Durene Romans MD Attending [facility])

## 2019-09-15 LAB — CYTOLOGY - PAP
Comment: NEGATIVE
Diagnosis: NEGATIVE
High risk HPV: NEGATIVE

## 2019-09-15 LAB — TSH: TSH: 2.67 u[IU]/mL (ref 0.450–4.500)

## 2019-09-16 ENCOUNTER — Other Ambulatory Visit: Payer: Self-pay

## 2019-09-16 ENCOUNTER — Ambulatory Visit (INDEPENDENT_AMBULATORY_CARE_PROVIDER_SITE_OTHER): Payer: Medicaid Other | Admitting: Clinical

## 2019-09-16 DIAGNOSIS — F39 Unspecified mood [affective] disorder: Secondary | ICD-10-CM | POA: Diagnosis not present

## 2019-09-16 NOTE — Patient Instructions (Signed)
[name],   The following are included below: 1. List of Primary care offices taking new patients, 2. Information about panic attacks, 3. List of apps for self-care, 4. Behavioral health resources for future reference (including 24/7 Mobile Crisis number). Remember to make sure your voicemail is set up and is not full, so when psychiatry referral goes in, they may contact you to set up the initial appointment. My current office number is [phone], and I will call within the week if the number changes after our move. I will see you in two weeks, for a virtual appointment at 2:45pm on May 12th. Take care, [facility] primary care offices accepting new patients:   Primary Care at [facility] [address] [place], [place] [address] [phone]  [facility] [address] [place], [place] [address] [phone]  [facility] [address] [place], [place] [address] [phone]  [facility] [address] [place], [place] [address] [phone]  [facility] [address] [place],  [place]  [address] [phone]  Coping with Panic Attacks   What is a panic attack?  You may have had a panic attack if you experienced four or more of the symptoms listed below coming on abruptly and peaking in about 10 minutes.  Panic Symptoms   . Pounding heart  . Sweating  . Trembling or shaking  . Shortness of breath  . Feeling of choking  . Chest pain  . Nausea or abdominal distress    . Feeling dizzy, unsteady, lightheaded, or faint  . Feelings of unreality or being detached from yourself  . Fear of losing control or going crazy  . Fear of dying  . Numbness or tingling  . Chills or hot flashes      Panic attacks are sometimes accompanied by avoidance of certain places or situations. These are often situations that would be difficult to escape from or in which help might not be  available. Examples might include crowded shopping malls, public transportation, restaurants, or driving.   Why do panic attacks occur?   Panic attacks are the body's alarm system gone awry. All of [place] have a built-in alarm system, powered by adrenaline, which increases our heart rate, breathing, and blood flow in response to danger. Ordinarily, this 'danger response system' works well. In some people, however, the response is either out of proportion to whatever stress is going on, or may come out of the blue without any stress at all.   For example, if you are walking in the woods and see a bear coming your way, a variety of changes occur in your body to prepare you to either fight the danger or flee from the situation. Your heart rate will increase to get more blood flow around your body, your breathing rate will quicken so that more oxygen is available, and your muscles will tighten in order to be ready to fight or run. You may feel nauseated as blood flow leaves your stomach area and moves into your limbs. These bodily changes are all essential to helping you survive the dangerous situation. After the danger has passed, your body functions will begin to go back to normal. This is because your body also has a system for "recovering" by bringing your body back down to a normal state when the danger is over.   As you can see, the emergency response system is adaptive when there is,  in fact, a "true" or "real" danger (e.g., bear). However, sometimes people find that their emergency response system is triggered in "everyday" situations where there really is no true physical danger (e.g., in a meeting, in the grocery store, while driving in normal traffic, etc.).   What triggers a panic attack?  Sometimes particularly stressful situations can trigger a panic attack. For example, an argument with your spouse or stressors at work can cause a stress response (activating the emergency response system) because  you perceive it as threatening or overwhelming, even if there is no direct risk to your survival.  Sometimes panic attacks don't seem to be triggered by anything in particular- they may "come out of the blue". Somehow, the natural "fight or flight" emergency response system has gotten activated when there is no real danger. Why does the body go into "emergency mode" when there is no real danger?   Often, people with panic attacks are frightened or alarmed by the physical sensations of the emergency response system. First, unexpected physical sensations are experienced (tightness in your chest or some shortness of breath). This then leads to feeling fearful or alarmed by these symptoms ("Something's wrong!", "Am I having a heart attack?", "Am I going to faint?") The mind perceives that there is a danger even though no real danger exists. This, in turn, activates the emergency response system ("fight or flight"), leading to a "full blown" panic attack. In summary, panic attacks occur when we misinterpret physical symptoms as signs of impending death, craziness, loss of control, embarrassment, or fear of fear. Sometimes you may be aware of thoughts of danger that activate the emergency response system (for example, thinking "I'm having a heart attack" when you feel chest pressure or increased heart rate). At other times, however, you may not be aware of such thoughts. After several incidences of being afraid of physical sensations, anxiety and panic can occur in response to the initial sensations without conscious thoughts of danger. Instead, you just feel afraid or alarmed. In other words, the panic or fear may seem to occur "automatically" without you consciously telling yourself anything.   After having had one or more panic attacks, you may also become more focused on what is going on inside your body. You may scan your body and be more vigilant about noticing any symptoms that might signal the start of a  panic attack. This makes it easier for panic attacks to happen again because you pick up on sensations you might otherwise not have noticed, and misinterpret them as something dangerous. A panic attack may then result.      How do I cope with panic attacks?  An important part of overcoming panic attacks involves re-interpreting your body's physical reactions and teaching yourself ways to decrease the physical arousal. This can be done through practicing the cognitive and behavioral interventions below.   Research has found that over half of people who have panic attacks show some signs of hyperventilation or overbreathing. This can produce initial sensations that alarm you and lead to a panic attack. Overbreathing can also develop as part of the panic attack and make the symptoms worse. When people hyperventilate, certain blood vessels in the body become narrower. In particular, the brain may get slightly less oxygen. This can lead to the symptoms of dizziness, confusion, and lightheadedness that often occur during panic attacks. Other parts of the body may also get a bit less oxygen, which may lead to numbness or tingling in the hands  or feet or the sensation of cold, clammy hands. It also may lead the heart to pump harder. Although these symptoms may be frightening and feel unpleasant, it is important to remember that hyperventilating is not dangerous. However, you can help overcome the unpleasantness of overbreathing by practicing Breathing Retraining.   Practice this basic technique three times a day, every day:  . Inhale. With your shoulders relaxed, inhale as slowly and deeply as you can while you count to six. If you can, use your diaphragm to fill your lungs with air.  . Hold. Keep the air in your lungs as you slowly count to four.  . Exhale. Slowly breath out as you count to six.  . Repeat. Do the inhale-hold-exhale cycle several times. Each time you do it, exhale for longer counts.  Like  any new skill, Breathing Retraining requires practice. Try practicing this skill twice a day for several minutes. Initially, do not try this technique in specific situations or when you become frightened or have a panic attack. Begin by practicing in a quiet environment to build up your skill level so that you can later use it in time of "emergency."   2. Decreasing Avoidance  Regardless of whether you can identify why you began having panic attacks or whether they seemed to come out of the blue, the places where you began having panic attacks often can become triggers themselves. It is not uncommon for individuals to begin to avoid the places where they have had panic attacks. Over time, the individual may begin to avoid more and more places, thereby decreasing their activities and often negatively impacting their quality of life. To break the cycle of avoidance, it is important to first identify the places or situations that are being avoided, and then to do some "relearning."  To begin this intervention, first create a list of locations or situations that you tend to avoid. Then choose an avoided location or situation that you would like to target first. Now develop an "exposure hierarchy" for this situation or location. An "exposure hierarchy" is a list of actions that make you feel anxious in this situation. Order these actions from least to most anxiety-producing. It is often helpful to have the first item on your hierarchy involve thinking or imagining part of the feared/avoided situation.   Here is an example of an exposure hierarchy for decreasing avoidance of the grocery store. Note how it is ordered from the least amount of anxiety (at the top) to the most anxiety (at the bottom):  Marland Kitchen Think about going to the grocery store alone.  . Go to the grocery store with a friend or family member.  . Go to the grocery store alone to pick up a few small items (5-10 minutes in the store).  . Shopping for  10-20 minutes in the store alone.  . Doing the shopping for the week by myself (20-30 minutes in the store).   Your homework is to "expose" yourself to the lowest item on your hierarchy and use your breathing relaxation and coping statements (see below) to help you remain in the situation. Practice this several times during the upcoming week. Once you have mastered each item with minimal anxiety, move on to the next higher action on your list.   Cognitive Interventions  1. Identify your negative self-talk Anxious thoughts can increase anxiety symptoms and panic. The first step in changing anxious thinking is to identify your own negative, alarming self-talk. Some common alarming thoughts:  .  I'm having a heart attack.            . I must be going crazy. . I think I'm dying. Marland Kitchen People will think I'm crazy. . I'm going to pass our.  . Oh no- here it comes.  . I can't stand this.  Peggye Form got to get out of here!  2. Use positive coping statements Changing or disrupting a pattern of anxious thoughts by replacing them with more calming or supportive statements can help to divert a panic attack. Some common helpful coping statements:  . This is not an emergency.  . I don't like feeling this way, but I can accept it.  . I can feel like this and still be okay.  . This has happened before, and I was okay. I'll be okay this time, too.  . I can be anxious and still deal with this situation.   /Emotional Wellbeing Apps and Websites Here are a few free apps meant to help you to help yourself.  To find, try searching on the internet to see if the app is offered on Apple/Android devices. If your first choice doesn't come up on your device, the good news is that there are many choices! Play around with different apps to see which ones are helpful to you.    Calm This is an app meant to help increase calm feelings. Includes info, strategies, and tools for tracking your feelings.      Calm Harm  This  app is meant to help with self-harm. Provides many 5-minute or 15-min coping strategies for doing instead of hurting yourself.       Healthy Minds Health Minds is a problem-solving tool to help deal with emotions and cope with stress you encounter wherever you are.      MindShift This app can help people cope with anxiety. Rather than trying to avoid anxiety, you can make an important shift and face it.      MY3  MY3 features a support system, safety plan and resources with the goal of offering a tool to use in a time of need.       My Life My Voice  This mood journal offers a simple solution for tracking your thoughts, feelings and moods. Animated emoticons can help identify your mood.       Relax Melodies Designed to help with sleep, on this app you can mix sounds and meditations for relaxation.      Smiling Mind Smiling Mind is meditation made easy: it's a simple tool that helps put a smile on your mind.        Stop, Breathe & Think  A friendly, simple guide for people through meditations for mindfulness and compassion.  Stop, Breathe and Think Kids Enter your current feelings and choose a "mission" to help you cope. Offers videos for certain moods instead of just sound recordings.       Team Orange The goal of this tool is to help teens change how they think, act, and react. This app helps you focus on your own good feelings and experiences.      The United Station[employer] Box The United Stationers Box (VHB) contains simple tools to help patients with coping, relaxation, distraction, and positive thinking.    Behavioral Health Resources:   What if I or someone I know is in crisis?  . If you are thinking about harming yourself or having thoughts of suicide, or if you know someone who is,  seek help right away.  . Call your doctor or mental health care provider.  . Call 911 or go to a hospital emergency room to get immediate help, or ask a friend or family member to  help you do these things.  . Call the [place] National Suicide Prevention Lifeline's toll-free, 24-hour hotline at 1-800-273-TALK 223-395-6995) or TTY: 1-800-799-4 TTY 760 511 5488) to talk to a trained counselor.  . If you are in crisis, make sure you are not left alone.   . If someone else is in crisis, make sure he or she is not left alone   24 Hour :   [place] National Suicide Hotline: (810)828-1937  [facility] Mobile Crisis: 562 340 9054   [facility]  [address], [place], [place] [address]  [phone] or [phone]  [facility] the [employer] (Domestic Violence, Rape & Victim Assistance)  [phone]  [facility]  [address], [place]  [address]   [phone] or [phone]   [facility] [employer] [facility]: [phone] (8am-4pm) or [phone] (after hours)           [facility], [address] [place], [place]  [phone] Fax: [phone] [email or web address]  *Interpreters available *Accepts Medicaid, Medicare, uninsured  [facility]   Mon-Fri: 8am-5pm [address] [place], [place] [phone](WCHEN); [phone](fax) [email or web address]  *Accepts Medicare  [facility] [place], Fri: 8am-4pm [address], [place], [place]  [phone] (phone); [phone] (fax) [email or web address]  *Accepts Medicare  [facility] Mon-Fri: 9am-5pm  [address] [place], [place] [phone] (phone); [phone]  [email or web address]  *Accepts Medicaid  [facility] [address], [place], [place]  [phone]  [email or web address].[email or web address]   [facility] [place], 8:30am-12pm/1pm-2:30pm [address], [place], [place] [phone] (phone); [phone] (fax) www.[email or web address]  *Accepts Medicaid, sliding-scale*Bilingual services available  [facility] Mon-Fri, 8am-7pm [address], [place], [place]  [phone](XQJJH); [phone](fax) www.[email or web address]  *Accepts Medicaid *Bilingual services available  [facility] Mon-Fri: 8am-5pm, Saturday by appointment only [address] [place], [place], [place] [phone] (phone); [phone] (fax) [email or web address]   [facility] [address], [place], [place] [phone] [email or web address]  *Free & reduced services for uninsured and underinsured individuals *Bilingual services for Spanish-speaking clients 21 and under  [facility], [address] [place], [place] [phone](GGEZM); [phone](fax) KittenExchange.[email or web address]  *Bring your own interpreter at first visit *Accepts Medicare and [facility]  [facility] Mon-Fri: 9am-5:30pm [address], [place], [place] [phone] (phone), [phone] (fax) After hours crisis line: [phone] [email or web address]  *Accepts Medicare and Medicaid  [employer], 8am-6pm [address] [place], [place]  [phone] (phone); [phone] (fax) http://[email or web address]  *Subsidized costs available  [facility] Mon-Fri: 8am-4pm [address] [place], [place] [phone](UQJFH); [phone](fax) www.[email or web address]  *Accepts Medicaid  [facility] Same day access hours: Mon-Fri, 8:30-3pm Crisis hours: Mon-Fri, 8am-5pm [address], [place], [place]  [facility] [employer] Same day access hours: Mon-Fri, 8:30-3pm Crisis hours: Mon-Fri, 8am-8pm [address], [place], [place] [phone] (phone); [phone]  (fax) www.[email or web address]  *Accepts Medicaid and Medicare  [facility], [place], Fri: 9am-9pm Tues, Thurs: 9am-6pm [address] [place], [place], [place]  [phone] (phone); [phone] (fax) [email or web address]  *(Accepts Medicare and Medicaid; payment plans available)*Bilingual services available  [facility] [address] [place], [place] [phone] (phone); [phone] (fax) [email or web address]   [facility]  [address], [place], [place]  [phone]  [email or web address]  *Bilingual services available  [facility]) Mon-Thurs: 8am-8pm [address], [place], [place] [phone] (phone); [phone] (fax) [email or web address]  *Accepts Medicaid*Bilingual services available  [facility] [address], [place], [place] [phone] (phone) [email or web address]  *Accepts Medicaid *Bilingual services available  [facility] Mon-Fri, 9am-4:45pm [address], [place], [place] [phone] (phone); [phone] (fax) [email or web address]  *Accepts Medicare  [facility] Mon-Thurs: 8:30-8pm, Fri: 8:30am-7pm [address], [place], [place] (3rd floor) [phone] (phone); [phone] (fax) [email or web address]  *Accepts Medicaid; income-based reduced rates available  [facility] Mon-Fri: 8am-5pm [address], [place], [place] [phone] (phone); [phone] (fax) [email or web address]://[email or web address]  *Accepts Medicaid*Bilingual services available  [facility] [address], [address], [place], [place]  [phone] (phone); [phone] (fax) www.[email or web address]  *Free emergency housing and clinical services for youth in crisis  [facility] ([facility] [place])  [address],  [place] [phone] www.[email or web address]  *Provides direct services to individuals in recovery from mental illness, including support groups, recovery skills classes, and one on one peer support  [employer](National Alliance on Mental Illness) [place] NAMI helpline: 619-620-6441717 [phone]  https://[email or web address]  *A community hub for information relating to local resources and services for the friends and families of individuals living alongside a mental health condition, as well as the individuals themselves. Classes and support groups also provided

## 2019-09-29 NOTE — BH Specialist Note (Signed)
Integrated Behavioral Health via Telemedicine Video Visit  09/29/2019 Terri Tran [id number]  Number of Integrated Behavioral Health visits: 2 Session Start time: 2:54  Session End time: 3:16 Total time: 22  Referring Provider: [place] Bing, MD Type of Visit: Video Patient/Family location: [facility] Provider location: [facility]  All persons participating in visit: Patient Terri Tran and Terri Tran    Confirmed patient's address: Yes  Confirmed patient's phone number: Yes  Any changes to demographics: No   Confirmed patient's insurance: Yes  Any changes to patient's insurance: No   Discussed confidentiality: at previous visit  I connected with Terri Tran by a video enabled telemedicine application and verified that I am speaking with the correct person using two identifiers.     I discussed the limitations of evaluation and management by telemedicine and the availability of in person appointments.  I discussed that the purpose of this visit is to provide behavioral health care while limiting exposure to the novel coronavirus.   Discussed there is a possibility of technology failure and discussed alternative modes of communication if that failure occurs.  I discussed that engaging in this video visit, they consent to the provision of behavioral healthcare and the services will be billed under their insurance.  Patient and/or legal guardian expressed understanding and consented to video visit: Yes   PRESENTING CONCERNS: Patient and/or family reports the following symptoms/concerns: Pt states she did have some panicky moments when her 29yo escaped the house today, but has overall been feeling less anxious since last visit. Pt looks forward to first visit with PCP on 10/13/19 and graduating with her Associate Degree on 10/03/19; pt's goals are to establish with psychiatry (waiting for initial appointment) and to find  work after graduation.  Duration of problem: Ongoing; Severity of problem: moderate  STRENGTHS (Protective Factors/Coping Skills): Resilience, self-awareness, future-oriented outlook  GOALS ADDRESSED: Patient will: 1.  Reduce symptoms of: anxiety, depression and stress  2.  Increase knowledge and/or ability of: stress reduction  3.  Demonstrate ability to: Increase healthy adjustment to current life circumstances and Increase adequate support systems for patient/family  INTERVENTIONS: Interventions utilized:  Solution-Focused Strategies and Link to [employer] Standardized Assessments completed: GAD-7  ASSESSMENT: Patient currently experiencing Mood disorder.   Patient may benefit from continued psychoeducation and brief therapeutic interventions regarding coping with symptoms of depression, anxiety and life stress .  PLAN: 1. Follow up with behavioral health clinician on : One month 2. Behavioral recommendations:  -Continue with plan to attend initial PCP appointment, as well as psychiatry appointment (when it is scheduled) -Sign consent sent to phone [facility]) for referrals to be sent -Continue using self-coping strategies daily to manage stress and anxiety -Obtain tall baby gate this week to help prevent additional "escapes" and for peace of mind 3. Referral(s): Integrated Art gallery manager (In Clinic) and [employer]:  Work  I discussed the assessment and treatment plan with the patient and/or parent/guardian. They were provided an opportunity to ask questions and all were answered. They agreed with the plan and demonstrated an understanding of the instructions.   They were advised to call back or seek an in-person evaluation if the symptoms worsen or if the condition fails to improve as anticipated.  [name]  Depression screen [facility] 2/9 09/14/2019 07/29/2018 08/28/2016 08/02/2016 06/27/2016  Decreased Interest 1 1 0 0 0  Down, Depressed,  Hopeless 1 1 0 0 0  PHQ -  2 Score 2 2 0 0 0  Altered sleeping 0 1 0 0 0  Tired, decreased energy 1 1 0 0 0  Change in appetite 2 0 0 0 0  Feeling bad or failure about yourself  0 0 0 0 0  Trouble concentrating 0 0 0 0 0  Moving slowly or fidgety/restless 0 0 0 0 0  Suicidal thoughts 0 0 0 0 0  PHQ-9 Score 5 4 0 0 0   GAD 7 : Generalized Anxiety Score 09/30/2019 09/14/2019 07/29/2018 08/28/2016  Nervous, Anxious, on Edge 2 3 1  0  Control/stop worrying 1 2 0 0  Worry too much - different things 1 1 0 0  Trouble relaxing 1 0 0 0  Restless 1 0 0 0  Easily annoyed or irritable 1 3 0 0  Afraid - awful might happen 0 2 0 0  Total GAD 7 Score 7 11 1  0

## 2019-09-30 ENCOUNTER — Ambulatory Visit (INDEPENDENT_AMBULATORY_CARE_PROVIDER_SITE_OTHER): Payer: Medicaid Other | Admitting: Clinical

## 2019-09-30 DIAGNOSIS — F39 Unspecified mood [affective] disorder: Secondary | ICD-10-CM | POA: Diagnosis not present

## 2019-10-12 ENCOUNTER — Telehealth: Payer: Self-pay

## 2019-10-12 NOTE — Telephone Encounter (Signed)
Called patient to do their pre-visit COVID screening.  Call went to voicemail. Unable to do prescreening.  

## 2019-10-13 ENCOUNTER — Telehealth: Payer: Medicaid Other | Admitting: Internal Medicine

## 2019-10-14 ENCOUNTER — Telehealth (INDEPENDENT_AMBULATORY_CARE_PROVIDER_SITE_OTHER): Payer: Medicaid Other | Admitting: Internal Medicine

## 2019-10-14 ENCOUNTER — Encounter: Payer: Self-pay | Admitting: Internal Medicine

## 2019-10-14 DIAGNOSIS — F32A Depression, unspecified: Secondary | ICD-10-CM | POA: Insufficient documentation

## 2019-10-14 DIAGNOSIS — F329 Major depressive disorder, single episode, unspecified: Secondary | ICD-10-CM | POA: Diagnosis not present

## 2019-10-14 DIAGNOSIS — Z8759 Personal history of other complications of pregnancy, childbirth and the puerperium: Secondary | ICD-10-CM

## 2019-10-14 DIAGNOSIS — F411 Generalized anxiety disorder: Secondary | ICD-10-CM | POA: Insufficient documentation

## 2019-10-14 DIAGNOSIS — F410 Panic disorder [episodic paroxysmal anxiety] without agoraphobia: Secondary | ICD-10-CM | POA: Insufficient documentation

## 2019-10-14 DIAGNOSIS — Z7689 Persons encountering health services in other specified circumstances: Secondary | ICD-10-CM | POA: Diagnosis not present

## 2019-10-14 MED ORDER — SERTRALINE HCL 25 MG PO TABS: ORAL_TABLET | ORAL | 1 refills | Status: DC

## 2019-10-14 NOTE — Progress Notes (Signed)
Virtual Visit via Telephone Note  I connected with Terri Tran, on 10/14/2019 at 3:06 PM by telephone due to the COVID-19 pandemic and verified that I am speaking with the correct person using two identifiers.   Consent: I discussed the limitations, risks, security and privacy concerns of performing an evaluation and management service by telephone and the availability of in person appointments. I also discussed with the patient that there may be a patient responsible charge related to this service. The patient expressed understanding and agreed to proceed.   Location of Patient: Home   Location of Provider: Clinic    Persons participating in Telemedicine visit: Maikayla Beggs [facility] Dr. Juleen China      History of Present Illness: Patient has a visit to establish care. Patient reports that during pregnancy she has had gHTN but no history of HTN outside of pregnancy. Surgical history: 3 C-sections. She takes OCPs. Not breastfeeding.   Patient has concerns about anxiety. Has been having it for awhile but reports it has been "getting higher". She has been nail biting a lot more. Becoming more irritable. Has a lot of panic attacks where heart races. Has one on average every other day.   Feeling down as well. Hasn't been motivated to put make up on, do her hair, etc which she used to really enjoy. She cries most days. Talking to her mom helps temporarily. She also sees [place] [place] [facility].   She is interested in starting medication.   Depression screen [facility] 2/9 10/14/2019 09/14/2019 07/29/2018  Decreased Interest 1 1 1   Down, Depressed, Hopeless 1 1 1   PHQ - 2 Score 2 2 2   Altered sleeping 1 0 1  Tired, decreased energy 2 1 1   Change in appetite 3 2 0  Feeling bad or failure about yourself  1 0 0  Trouble concentrating 1 0 0  Moving slowly or fidgety/restless 0 0 0  Suicidal thoughts 0 0 0  PHQ-9 Score 10 5 4    GAD 7 : Generalized Anxiety Score 10/14/2019 09/30/2019  09/14/2019 07/29/2018  Nervous, Anxious, on Edge 2 2 3 1   Control/stop worrying 2 1 2  0  Worry too much - different things 2 1 1  0  Trouble relaxing 2 1 0 0  Restless 1 1 0 0  Easily annoyed or irritable 1 1 3  0  Afraid - awful might happen 2 0 2 0  Total GAD 7 Score 12 7 11 1         Past Medical History:  Diagnosis Date  . Abnormal genetic test 10/01/2018   SMA carrier Rec Fob get tested  . Anemia affecting first pregnancy 2013   was taking Iron supplements  . Anxiety    [employer] 10/12/2019  . Depression 2013   postpartum depression after first delivery; was prescribed Xanax  . GBS bacteriuria 09/13/2018  . GERD (gastroesophageal reflux disease)    during pregnancy only; takes Tums; helps  . Hypertension   . Pregnancy induced hypertension    No Known Allergies  Current Outpatient Medications on File Prior to Visit  Medication Sig Dispense Refill  . norethindrone (CAMILA) 0.35 MG tablet Take 1 tablet (0.35 mg total) by mouth daily. 3 Package 3   No current facility-administered medications on file prior to visit.    Observations/Objective: NAD. Speaking clearly.  Work of breathing normal.  Alert and oriented. Mood appropriate.   Assessment and Plan: 1. Encounter to establish care Reviewed patient's PMH, social history, surgical history,  and medications.   2. History of gestational hypertension Will monitor for HTN outside of pregnancy.  3. Generalized anxiety disorder 4. Depression, unspecified depression type PHQ-9 elevated at 10 and GAD-7 elevated at 12. No safety concerns or SI. Currently followed by behavioral health. Will start Zoloft for anxiety/depression. Discussed typical side effects, how to adjust starting dose, and when can expect to see improvement in symptoms. Return in 6-8 weeks for follow up.  - sertraline (ZOLOFT) 25 MG tablet; Take one tablet daily. If tolerating, increase to two tablets in one week.  Dispense: 60 tablet; Refill: 1    Follow Up  Instructions: 6-8 week f/u    I discussed the assessment and treatment plan with the patient. The patient was provided an opportunity to ask questions and all were answered. The patient agreed with the plan and demonstrated an understanding of the instructions.   The patient was advised to call back or seek an in-person evaluation if the symptoms worsen or if the condition fails to improve as anticipated.     I provided 18 minutes total of non-face-to-face time during this encounter including median intraservice time, reviewing previous notes, investigations, ordering medications, medical decision making, coordinating care and patient verbalized understanding at the end of the visit.    Marcy Siren, D.O. Primary Care at [facility]  10/14/2019, 3:06 PM

## 2019-10-27 NOTE — BH Specialist Note (Signed)
Pt answered phone and requests to reschedule to Monday, 11/16/19 for virtual visit.

## 2019-11-09 ENCOUNTER — Other Ambulatory Visit: Payer: Self-pay

## 2019-11-09 ENCOUNTER — Ambulatory Visit: Payer: Self-pay | Admitting: Clinical

## 2019-11-10 NOTE — BH Specialist Note (Signed)
Pt did not arrive to video visit and did not answer the phone ; Left HIPPA-compliant message to call back [name] from Center for [employer] at [facility] at [phone] (main office) or [phone] ([name]'s office).  ; left MyChart message for patient.    Integrated Behavioral Health via Telemedicine Video (Caregility) Visit  11/10/2019 Terri Tran [id number]  [name]

## 2019-11-16 ENCOUNTER — Ambulatory Visit: Payer: Self-pay | Admitting: Clinical

## 2019-11-16 DIAGNOSIS — Z91199 Patient's noncompliance with other medical treatment and regimen due to unspecified reason: Secondary | ICD-10-CM

## 2019-12-08 ENCOUNTER — Telehealth: Payer: Medicaid Other | Admitting: Internal Medicine

## 2020-01-13 DIAGNOSIS — F32A Depression, unspecified: Secondary | ICD-10-CM

## 2020-01-13 DIAGNOSIS — F411 Generalized anxiety disorder: Secondary | ICD-10-CM

## 2020-01-13 MED ORDER — SERTRALINE HCL 25 MG PO TABS: 50.0000 mg | ORAL_TABLET | Freq: Every day | ORAL | 1 refills | Status: DC

## 2020-03-09 ENCOUNTER — Encounter: Payer: Self-pay | Admitting: Women's Health

## 2020-03-09 ENCOUNTER — Other Ambulatory Visit (HOSPITAL_COMMUNITY)
Admission: RE | Admit: 2020-03-09 | Discharge: 2020-03-09 | Disposition: A | Discharge status: Home / Self Care | Payer: Medicaid Other | Source: Ambulatory Visit | Attending: Women's Health | Admitting: Women's Health

## 2020-03-09 ENCOUNTER — Ambulatory Visit (INDEPENDENT_AMBULATORY_CARE_PROVIDER_SITE_OTHER): Payer: Self-pay | Admitting: Women's Health

## 2020-03-09 ENCOUNTER — Other Ambulatory Visit: Payer: Self-pay

## 2020-03-09 VITALS — Wt 377.0 lb

## 2020-03-09 DIAGNOSIS — O2342 Unspecified infection of urinary tract in pregnancy, second trimester: Secondary | ICD-10-CM

## 2020-03-09 DIAGNOSIS — O0990 Supervision of high risk pregnancy, unspecified, unspecified trimester: Secondary | ICD-10-CM | POA: Insufficient documentation

## 2020-03-09 DIAGNOSIS — F32A Depression, unspecified: Secondary | ICD-10-CM

## 2020-03-09 DIAGNOSIS — O280 Abnormal hematological finding on antenatal screening of mother: Secondary | ICD-10-CM

## 2020-03-09 DIAGNOSIS — F411 Generalized anxiety disorder: Secondary | ICD-10-CM

## 2020-03-09 DIAGNOSIS — Z98891 History of uterine scar from previous surgery: Secondary | ICD-10-CM

## 2020-03-09 NOTE — Patient Instructions (Addendum)
Maternity Assessment Unit (MAU)  The Maternity Assessment Unit (MAU) is located at the [facility] at [facility]. The address is: [address] [place], [place], [place] [address]. Please see map below for additional directions.    The Maternity Assessment Unit is designed to help you during your pregnancy, and for up to 6 weeks after delivery, with any pregnancy- or postpartum-related emergencies, if you think you are in labor, or if your water has broken. For example, if you experience nausea and vomiting, vaginal bleeding, severe abdominal or pelvic pain, elevated blood pressure or other problems related to your pregnancy or postpartum time, please come to the Maternity Assessment Unit for assistance.        Second Trimester of Pregnancy The second trimester is from week 14 through week 27 (months 4 through 6). The second trimester is often a time when you feel your best. Your body has adjusted to being pregnant, and you begin to feel better physically. Usually, morning sickness has lessened or quit completely, you may have more energy, and you may have an increase in appetite. The second trimester is also a time when the fetus is growing rapidly. At the end of the sixth month, the fetus is about 9 inches long and weighs about 1 pounds. You will likely begin to feel the baby move (quickening) between 16 and 20 weeks of pregnancy. Body changes during your second trimester Your body continues to go through many changes during your second trimester. The changes vary from woman to woman.  Your weight will continue to increase. You will notice your lower abdomen bulging out.  You may begin to get stretch marks on your hips, abdomen, and breasts.  You may develop headaches that can be relieved by medicines. The medicines should be approved by your health care provider.  You may urinate more often because the fetus is pressing on your bladder.  You may  develop or continue to have heartburn as a result of your pregnancy.  You may develop constipation because certain hormones are causing the muscles that push waste through your intestines to slow down.  You may develop hemorrhoids or swollen, bulging veins (varicose veins).  You may have back pain. This is caused by: ? Weight gain. ? Pregnancy hormones that are relaxing the joints in your pelvis. ? A shift in weight and the muscles that support your balance.  Your breasts will continue to grow and they will continue to become tender.  Your gums may bleed and may be sensitive to brushing and flossing.  Dark spots or blotches (chloasma, mask of pregnancy) may develop on your face. This will likely fade after the baby is born.  A dark line from your belly button to the pubic area (linea nigra) may appear. This will likely fade after the baby is born.  You may have changes in your hair. These can include thickening of your hair, rapid growth, and changes in texture. Some women also have hair loss during or after pregnancy, or hair that feels dry or thin. Your hair will most likely return to normal after your baby is born. What to expect at prenatal visits During a routine prenatal visit:  You will be weighed to make sure you and the fetus are growing normally.  Your blood pressure will be taken.  Your abdomen will be measured to track your baby's growth.  The fetal heartbeat will be listened to.  Any test results from the previous visit  will be discussed. Your health care provider may ask you:  How you are feeling.  If you are feeling the baby move.  If you have had any abnormal symptoms, such as leaking fluid, bleeding, severe headaches, or abdominal cramping.  If you are using any tobacco products, including cigarettes, chewing tobacco, and electronic cigarettes.  If you have any questions. Other tests that may be performed during your second trimester include:  Blood tests  that check for: ? Low iron levels (anemia). ? High blood sugar that affects pregnant women (gestational diabetes) between 67 and 28 weeks. ? Rh antibodies. This is to check for a protein on red blood cells (Rh factor).  Urine tests to check for infections, diabetes, or protein in the urine.  An ultrasound to confirm the proper growth and development of the baby.  An amniocentesis to check for possible genetic problems.  Fetal screens for spina bifida and Down syndrome.  HIV (human immunodeficiency virus) testing. Routine prenatal testing includes screening for HIV, unless you choose not to have this test. Follow these instructions at home: Medicines  Follow your health care provider's instructions regarding medicine use. Specific medicines may be either safe or unsafe to take during pregnancy.  Take a prenatal vitamin that contains at least 600 micrograms (mcg) of folic acid.  If you develop constipation, try taking a stool softener if your health care provider approves. Eating and drinking   Eat a balanced diet that includes fresh fruits and vegetables, whole grains, good sources of protein such as meat, eggs, or tofu, and low-fat dairy. Your health care provider will help you determine the amount of weight gain that is right for you.  Avoid raw meat and uncooked cheese. These carry germs that can cause birth defects in the baby.  If you have low calcium intake from food, talk to your health care provider about whether you should take a daily calcium supplement.  Limit foods that are high in fat and processed sugars, such as fried and sweet foods.  To prevent constipation: ? Drink enough fluid to keep your urine clear or pale yellow. ? Eat foods that are high in fiber, such as fresh fruits and vegetables, whole grains, and beans. Activity  Exercise only as directed by your health care provider. Most women can continue their usual exercise routine during pregnancy. Try to  exercise for 30 minutes at least 5 days a week. Stop exercising if you experience uterine contractions.  Avoid heavy lifting, wear low heel shoes, and practice good posture.  A sexual relationship may be continued unless your health care provider directs you otherwise. Relieving pain and discomfort  Wear a good support bra to prevent discomfort from breast tenderness.  Take warm sitz baths to soothe any pain or discomfort caused by hemorrhoids. Use hemorrhoid cream if your health care provider approves.  Rest with your legs elevated if you have leg cramps or low back pain.  If you develop varicose veins, wear support hose. Elevate your feet for 15 minutes, 3-4 times a day. Limit salt in your diet. Prenatal Care  Write down your questions. Take them to your prenatal visits.  Keep all your prenatal visits as told by your health care provider. This is important. Safety  Wear your seat belt at all times when driving.  Make a list of emergency phone numbers, including numbers for family, friends, the hospital, and police and fire departments. General instructions  Ask your health care provider for a referral to a  local prenatal education class. Begin classes no later than the beginning of month 6 of your pregnancy.  Ask for help if you have counseling or nutritional needs during pregnancy. Your health care provider can offer advice or refer you to specialists for help with various needs.  Do not use hot tubs, steam rooms, or saunas.  Do not douche or use tampons or scented sanitary pads.  Do not cross your legs for long periods of time.  Avoid cat litter boxes and soil used by cats. These carry germs that can cause birth defects in the baby and possibly loss of the fetus by miscarriage or stillbirth.  Avoid all smoking, herbs, alcohol, and unprescribed drugs. Chemicals in these products can affect the formation and growth of the baby.  Do not use any products that contain nicotine  or tobacco, such as cigarettes and e-cigarettes. If you need help quitting, ask your health care provider.  Visit your dentist if you have not gone yet during your pregnancy. Use a soft toothbrush to brush your teeth and be gentle when you floss. Contact a health care provider if:  You have dizziness.  You have mild pelvic cramps, pelvic pressure, or nagging pain in the abdominal area.  You have persistent nausea, vomiting, or diarrhea.  You have a bad smelling vaginal discharge.  You have pain when you urinate. Get help right away if:  You have a fever.  You are leaking fluid from your vagina.  You have spotting or bleeding from your vagina.  You have severe abdominal cramping or pain.  You have rapid weight gain or weight loss.  You have shortness of breath with chest pain.  You notice sudden or extreme swelling of your face, hands, ankles, feet, or legs.  You have not felt your baby move in over an hour.  You have severe headaches that do not go away when you take medicine.  You have vision changes. Summary  The second trimester is from week 14 through week 27 (months 4 through 6). It is also a time when the fetus is growing rapidly.  Your body goes through many changes during pregnancy. The changes vary from woman to woman.  Avoid all smoking, herbs, alcohol, and unprescribed drugs. These chemicals affect the formation and growth your baby.  Do not use any tobacco products, such as cigarettes, chewing tobacco, and e-cigarettes. If you need help quitting, ask your health care provider.  Contact your health care provider if you have any questions. Keep all prenatal visits as told by your health care provider. This is important. This information is not intended to replace advice given to you by your health care provider. Make sure you discuss any questions you have with your health care provider. Document Revised: 08/29/2018 Document Reviewed: 06/12/2016 Elsevier  Patient Education  2020 Elsevier Inc.        Round Ligament Pain  The round ligament is a cord of muscle and tissue that helps support the uterus. It can become a source of pain during pregnancy if it becomes stretched or twisted as the baby grows. The pain usually begins in the second trimester (13-28 weeks) of pregnancy, and it can come and go until the baby is delivered. It is not a serious problem, and it does not cause harm to the baby. Round ligament pain is usually a short, sharp, and pinching pain, but it can also be a dull, lingering, and aching pain. The pain is felt in the lower side of  the abdomen or in the groin. It usually starts deep in the groin and moves up to the outside of the hip area. The pain may occur when you:  Suddenly change position, such as quickly going from a sitting to standing position.  Roll over in bed.  Cough or sneeze.  Do physical activity. Follow these instructions at home:   Watch your condition for any changes.  When the pain starts, relax. Then try any of these methods to help with the pain: ? Sitting down. ? Flexing your knees up to your abdomen. ? Lying on your side with one pillow under your abdomen and another pillow between your legs. ? Sitting in a warm bath for 15-20 minutes or until the pain goes away.  Take over-the-counter and prescription medicines only as told by your health care provider.  Move slowly when you sit down or stand up.  Avoid long walks if they cause pain.  Stop or reduce your physical activities if they cause pain.  Keep all follow-up visits as told by your health care provider. This is important. Contact a health care provider if:  Your pain does not go away with treatment.  You feel pain in your back that you did not have before.  Your medicine is not helping. Get help right away if:  You have a fever or chills.  You develop uterine contractions.  You have vaginal bleeding.  You have nausea  or vomiting.  You have diarrhea.  You have pain when you urinate. Summary  Round ligament pain is felt in the lower abdomen or groin. It is usually a short, sharp, and pinching pain. It can also be a dull, lingering, and aching pain.  This pain usually begins in the second trimester (13-28 weeks). It occurs because the uterus is stretching with the growing baby, and it is not harmful to the baby.  You may notice the pain when you suddenly change position, when you cough or sneeze, or during physical activity.  Relaxing, flexing your knees to your abdomen, lying on one side, or taking a warm bath may help to get rid of the pain.  Get help from your health care provider if the pain does not go away or if you have vaginal bleeding, nausea, vomiting, diarrhea, or painful urination. This information is not intended to replace advice given to you by your health care provider. Make sure you discuss any questions you have with your health care provider. Document Revised: 10/23/2017 Document Reviewed: 10/23/2017 Elsevier Patient Education  2020 [employer].                         Safe Medications in Pregnancy    Acne: Benzoyl Peroxide Salicylic Acid  Backache/Headache: Tylenol: 2 regular strength every 4 hours OR              2 Extra strength every 6 hours  Colds/Coughs/Allergies: Benadryl (alcohol free) 25 mg every 6 hours as needed Breath right strips Claritin Cepacol throat lozenges Chloraseptic throat spray Cold-Eeze- up to three times per day Cough drops, alcohol free Flonase (by prescription only) Guaifenesin Mucinex Robitussin DM (plain only, alcohol free) Saline nasal spray/drops Sudafed (pseudoephedrine) & Actifed ** use only after [redacted] weeks gestation and if you do not have high blood pressure Tylenol Vicks Vaporub Zinc lozenges Zyrtec   Constipation: Colace Ducolax suppositories Fleet enema Glycerin suppositories Metamucil Milk of  magnesia Miralax Senokot Smooth move tea  Diarrhea: Kaopectate Imodium  A-D  *NO pepto Bismol  Hemorrhoids: Anusol Anusol HC Preparation H Tucks  Indigestion: Tums Maalox Mylanta Zantac  Pepcid  Insomnia: Benadryl (alcohol free) 25mg  every 6 hours as needed Tylenol PM Unisom, no Gelcaps  Leg Cramps: Tums MagGel  Nausea/Vomiting:  Bonine Dramamine Emetrol Ginger extract Sea bands Meclizine  Nausea medication to take during pregnancy:  Unisom (doxylamine succinate 25 mg tablets) Take one tablet daily at bedtime. If symptoms are not adequately controlled, the dose can be increased to a maximum recommended dose of two tablets daily (1/2 tablet in the morning, 1/2 tablet mid-afternoon and one at bedtime). Vitamin B6 100mg  tablets. Take one tablet twice a day (up to 200 mg per day).  Skin Rashes: Aveeno products Benadryl cream or 25mg  every 6 hours as needed Calamine Lotion 1% cortisone cream  Yeast infection: Gyne-lotrimin 7 Monistat 7   **If taking multiple medications, please check labels to avoid duplicating the same active ingredients **take medication as directed on the label ** Do not exceed 4000 mg of tylenol in 24 hours **Do not take medications that contain aspirin or ibuprofen

## 2020-03-09 NOTE — Progress Notes (Signed)
LMP is unsure thinks April and it was spotting came back in June and was faint and spotting which is normal to her.   Last pap 09/14/2019  Last baby born 01/2019  X3 Cesarean  Will need something while she's pregnant the Zoloft makes her really sick.

## 2020-03-09 NOTE — Progress Notes (Signed)
History:   Terri Tran is a 29 y.o. [phone] at [redacted]w[redacted]d by LMP being seen today for her first obstetrical visit. Her obstetrical history is significant for obesity and C/S x3. Patient does not intend to breast feed. Pregnancy history fully reviewed.  Pt reports this is a desired and planned pregnancy. Allergies: NKDA Current Medications: PNVs PMH: depression, anxiety. No HTN, DM, asthma. PSH: C/S x3. OB Hx: 2013 - C/S for obesity, 2018 - C/S for fetal distress at 8cm, 2020 - rC/S; all full-term Social Hx: pt does not smoke, drink, or use drugs. Family Hx: none Pt declines flu vaccine.  Patient reports no complaints.      HISTORY: OB History  Gravida Para Term Preterm AB Living  4 3 3  0 0 3  SAB TAB Ectopic Multiple Live Births  0 0 0 0 3    # Outcome Date GA Lbr Len/2nd Weight Sex Delivery Anes PTL Lv  4 Current           3 Term 02/02/19 [redacted]w[redacted]d  5 lb 9.2 oz (2.53 kg) F CS-LTranv Spinal  LIV     Name: Terri, Tran     Apgar1: 8  Apgar5: 8  2 Term 09/02/16 [redacted]w[redacted]d  6 lb 13.9 oz (3.115 kg) M CS-LTranv EPI  LIV     Name: Terri, Tran     Apgar1: 9  Apgar5: 9  1 Term 08/08/11 [redacted]w[redacted]d   F CS-LTranv Spinal N LIV    Last pap smear was done 08/2019 and was normal  Past Medical History:  Diagnosis Date  . Abnormal genetic test 10/01/2018   SMA carrier Rec Fob get tested  . Anemia affecting first pregnancy 2013   was taking Iron supplements  . Anxiety    Phreesia 10/12/2019  . Depression 2013   postpartum depression after first delivery; was prescribed Xanax  . GBS bacteriuria 09/13/2018  . GERD (gastroesophageal reflux disease)    during pregnancy only; takes Tums; helps  . Hypertension   . Pregnancy induced hypertension    Past Surgical History:  Procedure Laterality Date  . CESAREAN SECTION    . CESAREAN SECTION N/A 09/02/2016   Procedure: CESAREAN SECTION;  Surgeon: 09/04/2016, MD;  Location: [facility] BIRTHING SUITES;  Service: Obstetrics;  Laterality: N/A;  .  CESAREAN SECTION N/A 02/02/2019   Procedure: CESAREAN SECTION;  Surgeon: 02/04/2019, DO;  Location: [facility] LD ORS;  Service: Obstetrics;  Laterality: N/A;  . CESAREAN SECTION N/A    Phreesia 10/12/2019   Family History  Problem Relation Age of Onset  . Hypertension Mother    Social History   Tobacco Use  . Smoking status: Former Smoker    Packs/day: 0.50    Types: Cigarettes    Quit date: 07/21/2018    Years since quitting: 1.6  . Smokeless tobacco: Never Used  Vaping Use  . Vaping Use: Never used  Substance Use Topics  . Alcohol use: No    Alcohol/week: 7.0 standard drinks    Types: 7 Cans of beer per week  . Drug use: No   No Known Allergies Current Outpatient Medications on File Prior to Visit  Medication Sig Dispense Refill  . norethindrone (CAMILA) 0.35 MG tablet Take 1 tablet (0.35 mg total) by mouth daily. (Patient not taking: Reported on 03/09/2020) 3 Package 3  . sertraline (ZOLOFT) 25 MG tablet Take 2 tablets (50 mg total) by mouth daily. (Patient not taking: Reported on 03/09/2020) 60 tablet 1  No current facility-administered medications on file prior to visit.    Review of Systems Pertinent items noted in HPI and remainder of comprehensive ROS otherwise negative. Physical Exam:   Vitals:   03/09/20 1534  Weight: (!) 377 lb (171 kg)  No BP recorded at visit. Message sent to front desk to schedule pt ASAP for BP check only.  Fetal Heart Rate (bpm): 157  Uterus:   c/w dates  Pelvic Exam: Perineum: not evaluated   Vulva: not evaluated   Vagina:  not evaluated   Cervix: not evaluated   Adnexa: not evaluated   Bony Pelvis: not evaluated  System: General: well-developed, well-nourished female in no acute distress   Breasts:  normal appearance, no masses or tenderness bilaterally   Skin: normal coloration and turgor, no rashes   Neurologic: oriented, normal, negative, normal mood   Extremities: normal strength, tone, and muscle mass, ROM of all joints is  normal   HEENT PERRLA, extraocular movement intact and sclera clear, anicteric   Mouth/Teeth mucous membranes moist, pharynx normal without lesions and dental hygiene good   Neck supple and no masses   Cardiovascular: regular rate and rhythm   Respiratory:  no respiratory distress, normal breath sounds   Abdomen: soft, non-tender; bowel sounds normal; no masses,  no organomegaly    Assessment:    Pregnancy: [id number] Patient Active Problem List   Diagnosis Date Noted  . Abnormal antenatal AFP screen 03/15/2020  . UTI in pregnancy 03/15/2020  . History of cesarean delivery 03/15/2020  . Supervision of high risk pregnancy, antepartum 03/09/2020  . Depression 10/14/2019  . Generalized anxiety disorder 10/14/2019  . BMI 50.0-59.9, adult (HCC) 08/28/2016     Plan:    1. Supervision of high risk pregnancy, antepartum - [place] MFM OB DETAIL +14 WK; Future - GC/Chlamydia probe amp ([place])not at [facility] - Culture, OB Urine - CBC/D/Plt+RPR+Rh+ABO+Rub Ab... - Hemoglobin A1c - AFP, Serum, Open Spina Bifida   Initial labs drawn. Continue prenatal vitamins. Problem list reviewed and updated. Genetic Screening discussed, declines: declined. Ultrasound discussed; fetal anatomic survey: ordered. Anticipatory guidance about prenatal visits given including labs, ultrasounds, and testing. Discussed usage of Babyscripts and virtual visits as additional source of managing and completing prenatal visits in midst of coronavirus and pandemic.   Encouraged to complete MyChart Registration for her ability to review results, send requests, and have questions addressed.  The nature of [place] [facility] with multiple MDs and Advanced Practice Providers was explained to patient; also emphasized that residents, students are part of our team. Routine obstetric precautions reviewed. Encouraged to seek out care at office or emergency room [facility] preferred) for urgent  and/or emergent concerns. Return in about 4 weeks (around 04/06/2020) for virtual LOB/APP OK.     Marylen Ponto, NP  2:39 PM 03/15/2020

## 2020-03-10 LAB — GC/CHLAMYDIA PROBE AMP (~~LOC~~) NOT AT ARMC
Chlamydia: NEGATIVE
Comment: NEGATIVE
Comment: NORMAL
Neisseria Gonorrhea: NEGATIVE

## 2020-03-12 LAB — URINE CULTURE, OB REFLEX

## 2020-03-12 LAB — CULTURE, OB URINE

## 2020-03-14 LAB — CBC/D/PLT+RPR+RH+ABO+RUB AB...
Antibody Screen: NEGATIVE
Basophils Absolute: 0 10*3/uL (ref 0.0–0.2)
Basos: 0 %
EOS (ABSOLUTE): 0.1 10*3/uL (ref 0.0–0.4)
Eos: 1 %
HCV Ab: <0.1 s/co ratio (ref 0.0–0.9)
HIV Screen 4th Generation wRfx: NONREACTIVE
Hematocrit: 34.3 % (ref 34.0–46.6)
Hemoglobin: 11.6 g/dL (ref 11.1–15.9)
Hepatitis B Surface Ag: NEGATIVE
Immature Grans (Abs): 0.1 10*3/uL (ref 0.0–0.1)
Immature Granulocytes: 1 %
Lymphocytes Absolute: 1.9 10*3/uL (ref 0.7–3.1)
Lymphs: 14 %
MCH: 27.8 pg (ref 26.6–33.0)
MCHC: 33.8 g/dL (ref 31.5–35.7)
MCV: 82 fL (ref 79–97)
Monocytes Absolute: 1.1 10*3/uL — ABNORMAL HIGH (ref 0.1–0.9)
Monocytes: 8 %
Neutrophils Absolute: 10.3 10*3/uL — ABNORMAL HIGH (ref 1.4–7.0)
Neutrophils: 76 %
Platelets: 426 10*3/uL (ref 150–450)
RBC: 4.17 x10E6/uL (ref 3.77–5.28)
RDW: 12.6 % (ref 11.7–15.4)
RPR Ser Ql: NONREACTIVE
Rh Factor: POSITIVE
Rubella Antibodies, IGG: 1 index (ref >0.99)
WBC: 13.5 10*3/uL — ABNORMAL HIGH (ref 3.4–10.8)

## 2020-03-14 LAB — AFP, SERUM, OPEN SPINA BIFIDA
AFP MoM: 3.62
AFP Value: 135.2 ng/mL
Gest. Age on Collection Date: 19.6 weeks
Maternal Age At EDD: 31.9 yr
OSBR Risk 1 IN: 89
Test Results:: POSITIVE — AB
Weight: 377 [lb_av]

## 2020-03-14 LAB — HEMOGLOBIN A1C
Est. average glucose Bld gHb Est-mCnc: 103 mg/dL
Hgb A1c MFr Bld: 5.2 % (ref 4.8–5.6)

## 2020-03-14 LAB — HCV INTERPRETATION

## 2020-03-15 ENCOUNTER — Telehealth: Payer: Self-pay | Admitting: Lactation Services

## 2020-03-15 ENCOUNTER — Other Ambulatory Visit: Payer: Self-pay | Admitting: Women's Health

## 2020-03-15 DIAGNOSIS — Z98891 History of uterine scar from previous surgery: Secondary | ICD-10-CM | POA: Insufficient documentation

## 2020-03-15 DIAGNOSIS — O2342 Unspecified infection of urinary tract in pregnancy, second trimester: Secondary | ICD-10-CM

## 2020-03-15 DIAGNOSIS — O2340 Unspecified infection of urinary tract in pregnancy, unspecified trimester: Secondary | ICD-10-CM | POA: Insufficient documentation

## 2020-03-15 DIAGNOSIS — O280 Abnormal hematological finding on antenatal screening of mother: Secondary | ICD-10-CM | POA: Insufficient documentation

## 2020-03-15 MED ORDER — CEFADROXIL 500 MG PO CAPS: 500.0000 mg | ORAL_CAPSULE | Freq: Two times a day (BID) | ORAL | 0 refills | Status: AC

## 2020-03-15 NOTE — Telephone Encounter (Signed)
Patient called and asked about prescription for UTI. She reports she has checked with the [facility] and they don't have it.   Called her back and informed it was sent to [facility] on [address]. Patient voiced understanding.

## 2020-03-22 ENCOUNTER — Ambulatory Visit: Payer: Self-pay

## 2020-03-23 ENCOUNTER — Ambulatory Visit: Payer: Self-pay

## 2020-03-23 ENCOUNTER — Encounter: Payer: Self-pay | Admitting: *Deleted

## 2020-03-23 DIAGNOSIS — Z98891 History of uterine scar from previous surgery: Secondary | ICD-10-CM

## 2020-03-23 DIAGNOSIS — O280 Abnormal hematological finding on antenatal screening of mother: Secondary | ICD-10-CM

## 2020-03-23 DIAGNOSIS — O0990 Supervision of high risk pregnancy, unspecified, unspecified trimester: Secondary | ICD-10-CM

## 2020-03-23 DIAGNOSIS — O2340 Unspecified infection of urinary tract in pregnancy, unspecified trimester: Secondary | ICD-10-CM

## 2020-03-23 NOTE — Progress Notes (Unsigned)
DNKA blood pressure check today. Have sent message to registar to reschedule. Daveah Varone,RN

## 2020-03-31 ENCOUNTER — Ambulatory Visit: Payer: Medicaid Other | Attending: Women's Health

## 2020-03-31 ENCOUNTER — Other Ambulatory Visit: Payer: Self-pay

## 2020-03-31 ENCOUNTER — Ambulatory Visit: Payer: Medicaid Other

## 2020-03-31 ENCOUNTER — Encounter: Payer: Self-pay | Admitting: General Practice

## 2020-03-31 ENCOUNTER — Other Ambulatory Visit: Payer: Self-pay | Admitting: *Deleted

## 2020-03-31 ENCOUNTER — Ambulatory Visit (INDEPENDENT_AMBULATORY_CARE_PROVIDER_SITE_OTHER): Payer: Medicaid Other | Admitting: Lactation Services

## 2020-03-31 ENCOUNTER — Ambulatory Visit: Payer: Medicaid Other | Admitting: *Deleted

## 2020-03-31 VITALS — BP 134/72 | HR 83 | Ht 68.0 in | Wt 381.7 lb

## 2020-03-31 DIAGNOSIS — O280 Abnormal hematological finding on antenatal screening of mother: Secondary | ICD-10-CM | POA: Diagnosis not present

## 2020-03-31 DIAGNOSIS — O0990 Supervision of high risk pregnancy, unspecified, unspecified trimester: Secondary | ICD-10-CM | POA: Diagnosis not present

## 2020-03-31 DIAGNOSIS — Z98891 History of uterine scar from previous surgery: Secondary | ICD-10-CM | POA: Insufficient documentation

## 2020-03-31 DIAGNOSIS — R638 Other symptoms and signs concerning food and fluid intake: Secondary | ICD-10-CM

## 2020-03-31 NOTE — Progress Notes (Signed)
BP 134/72.   Patient was assessed and managed by nursing staff during this encounter. I have reviewed the chart and agree with the documentation and plan. I have also made any necessary editorial changes.  Jaynie Collins, MD 03/31/2020 11:54 AM   '

## 2020-03-31 NOTE — Progress Notes (Signed)
Patient here for BP check.   Medicaid Home form filled out by patient   Patient aware of follow up appts. She has no questions or concerns at this time.

## 2020-04-04 ENCOUNTER — Telehealth: Payer: Self-pay | Admitting: Lactation Services

## 2020-04-04 DIAGNOSIS — O0990 Supervision of high risk pregnancy, unspecified, unspecified trimester: Secondary | ICD-10-CM

## 2020-04-04 NOTE — Telephone Encounter (Signed)
Called patient to inform her that she needs to have in person visit on 11/17 so she can have her 2 hour GTT at the same time.   Patient did not answer. LM for patient to call the office re appt.   Message to front office to change appt to in person.   Patient would need to be notified that she needs to be fasting.

## 2020-04-04 NOTE — Telephone Encounter (Signed)
Patient returned call and was informed she needs to come in to the office for her appt on 11/17 for her 2 hour glucose testing and that her appt will be in person.   Patient advised that she needs to be fasting for her appt. With no food or drink after midnight. Patient voiced understanding.   Message to front office to change to in person appt.

## 2020-04-04 NOTE — Addendum Note (Signed)
Addended by: Ed Blalock on: 04/04/2020 01:50 PM   Modules accepted: Orders

## 2020-04-06 ENCOUNTER — Telehealth (INDEPENDENT_AMBULATORY_CARE_PROVIDER_SITE_OTHER): Payer: Medicaid Other | Admitting: Student

## 2020-04-06 VITALS — BP 121/80 | HR 79

## 2020-04-06 DIAGNOSIS — O2343 Unspecified infection of urinary tract in pregnancy, third trimester: Secondary | ICD-10-CM

## 2020-04-06 DIAGNOSIS — Z3A34 34 weeks gestation of pregnancy: Secondary | ICD-10-CM

## 2020-04-06 DIAGNOSIS — O0990 Supervision of high risk pregnancy, unspecified, unspecified trimester: Secondary | ICD-10-CM

## 2020-04-06 DIAGNOSIS — O280 Abnormal hematological finding on antenatal screening of mother: Secondary | ICD-10-CM

## 2020-04-06 DIAGNOSIS — Z98891 History of uterine scar from previous surgery: Secondary | ICD-10-CM

## 2020-04-06 DIAGNOSIS — O34219 Maternal care for unspecified type scar from previous cesarean delivery: Secondary | ICD-10-CM

## 2020-04-06 NOTE — Progress Notes (Signed)
° °  PRENATAL VISIT NOTE  Subjective:  Terri Tran is a 29 y.o. [phone] at [redacted]w[redacted]d being seen today for ongoing prenatal care.  She is currently monitored for the following issues for this high-risk pregnancy and has BMI 50.0-59.9, adult (HCC); Depression; Generalized anxiety disorder; Supervision of high risk pregnancy, antepartum; UTI in pregnancy; and History of cesarean delivery on their problem list.  Patient reports no complaints. She was sick today so she didn't come in for her GTT. She wants to know when her c/section will be and she needs to sign her BTL. HEr medicaid is not active yet.  Contractions: Not present. Vag. Bleeding: None.  Movement: Present. Denies leaking of fluid.   The following portions of the patient's history were reviewed and updated as appropriate: allergies, current medications, past family history, past medical history, past social history, past surgical history and problem list.   Objective:   Vitals:   04/06/20 1538  BP: 121/80  Pulse: 79    Fetal Status:     Movement: Present     General:  Alert, oriented and cooperative. Patient is in no acute distress.  Skin: Skin is warm and dry. No rash noted.   Cardiovascular: Normal heart rate noted  Respiratory: Normal respiratory effort, no problems with respiration noted  Abdomen: Soft, gravid, appropriate for gestational age.  Pain/Pressure: Present     Pelvic: Cervical exam deferred        Extremities: Normal range of motion.  Edema: None  Mental Status: Normal mood and affect. Normal behavior. Normal judgment and thought content.   Assessment and Plan:  Pregnancy: G4P3003 at [redacted]w[redacted]d 1. Abnormal antenatal AFP screen -dating is wrong, therefore AFP is wrong. [place] is normal with no signs of OSB, reassured patient of these findings.   2. Urinary tract infection in mother during third trimester of pregnancy -leave TOC on Friday morning  3. History of cesarean delivery -message sent to [place] to schedule at  39 weeks  4. Supervision of high risk pregnancy, antepartum -return on Friday morning for UTI TOC, 2 hour GTT and SIGN BTL Papers. Her medicaid is not active yet but will sign papers for when it does become active, confirmed with RN that this is appropriate because of her late gestation.   Preterm labor symptoms and general obstetric precautions including but not limited to vaginal bleeding, contractions, leaking of fluid and fetal movement were reviewed in detail with the patient. Please refer to After Visit Summary for other counseling recommendations.   Return in about 2 weeks (around 04/20/2020), or in person does not need to be MD.  Future Appointments  Date Time Provider Department Center  04/08/2020  8:50 AM [facility] LAB [facility]  04/22/2020  7:45 AM [facility] NURSE [facility]  04/22/2020  8:00 AM [facility] US1 [facility]  04/29/2020  9:30 AM [facility] NURSE [facility]  04/29/2020  9:45 AM [facility] US5 [facility]  05/06/2020  9:30 AM [facility] NURSE [facility]  05/06/2020  9:45 AM [facility] US5 [facility]    Samara Deist Gena Fray, CNM

## 2020-04-08 ENCOUNTER — Other Ambulatory Visit: Payer: Self-pay

## 2020-04-08 ENCOUNTER — Other Ambulatory Visit: Payer: Medicaid Other

## 2020-04-08 DIAGNOSIS — O0990 Supervision of high risk pregnancy, unspecified, unspecified trimester: Secondary | ICD-10-CM

## 2020-04-08 NOTE — Progress Notes (Unsigned)
BTL Consent signed while patient in office for labwork Izyk Marty,RN

## 2020-04-09 DIAGNOSIS — Z20822 Contact with and (suspected) exposure to covid-19: Secondary | ICD-10-CM | POA: Diagnosis not present

## 2020-04-09 LAB — GLUCOSE TOLERANCE, 2 HOURS W/ 1HR
Glucose, 1 hour: 160 mg/dL (ref 65–179)
Glucose, 2 hour: 81 mg/dL (ref 65–152)
Glucose, Fasting: 79 mg/dL (ref 65–91)

## 2020-04-21 ENCOUNTER — Ambulatory Visit (INDEPENDENT_AMBULATORY_CARE_PROVIDER_SITE_OTHER): Payer: Medicaid Other | Admitting: Obstetrics and Gynecology

## 2020-04-21 ENCOUNTER — Other Ambulatory Visit: Payer: Self-pay

## 2020-04-21 VITALS — BP 126/78 | HR 91 | Wt 378.0 lb

## 2020-04-21 DIAGNOSIS — Z3A36 36 weeks gestation of pregnancy: Secondary | ICD-10-CM

## 2020-04-21 DIAGNOSIS — Z6841 Body Mass Index (BMI) 40.0 and over, adult: Secondary | ICD-10-CM

## 2020-04-21 DIAGNOSIS — O2343 Unspecified infection of urinary tract in pregnancy, third trimester: Secondary | ICD-10-CM

## 2020-04-21 DIAGNOSIS — O99210 Obesity complicating pregnancy, unspecified trimester: Secondary | ICD-10-CM

## 2020-04-21 DIAGNOSIS — Z98891 History of uterine scar from previous surgery: Secondary | ICD-10-CM

## 2020-04-21 DIAGNOSIS — R8271 Bacteriuria: Secondary | ICD-10-CM

## 2020-04-21 DIAGNOSIS — O0990 Supervision of high risk pregnancy, unspecified, unspecified trimester: Secondary | ICD-10-CM

## 2020-04-21 MED ORDER — PRENATAL PLUS 27-1 MG PO TABS: 1.0000 | ORAL_TABLET | Freq: Every day | ORAL | 6 refills | Status: DC

## 2020-04-21 NOTE — Progress Notes (Signed)
  Prenatal Visit Note Date: 04/21/2020 Clinic: [facility]  Subjective:  Terri Tran is a 29 y.o. [phone] at [redacted]w[redacted]d being seen today for ongoing prenatal care.  She is currently monitored for the following issues for this high-risk pregnancy and has Obesity in pregnancy; BMI 50.0-59.9, adult (HCC); History of GBS bacteriuria; Depression; Generalized anxiety disorder; Supervision of high risk pregnancy, antepartum; UTI in pregnancy; and History of cesarean delivery on their problem list.  Patient reports no complaints.   Contractions: Not present. Vag. Bleeding: None.  Movement: Present. Denies leaking of fluid.   The following portions of the patient's history were reviewed and updated as appropriate: allergies, current medications, past family history, past medical history, past social history, past surgical history and problem list. Problem list updated.  Objective:   Vitals:   04/21/20 1437  BP: 126/78  Pulse: 91  Weight: (!) 378 lb (171.5 kg)    Fetal Status: Fetal Heart Rate (bpm): 136   Movement: Present     General:  Alert, oriented and cooperative. Patient is in no acute distress.  Skin: Skin is warm and dry. No rash noted.   Cardiovascular: Normal heart rate noted  Respiratory: Normal respiratory effort, no problems with respiration noted  Abdomen: Soft, gravid, appropriate for gestational age. Pain/Pressure: Present     Pelvic:  Cervical exam deferred        Extremities: Normal range of motion.  Edema: Trace  Mental Status: Normal mood and affect. Normal behavior. Normal judgment and thought content.   Urinalysis:      Assessment and Plan:  Pregnancy: G4P3003 at [redacted]w[redacted]d  1. Supervision of high risk pregnancy, antepartum Patient states she can't stay for labwork, swabs. Pt has a h/o gbs bacteruria last year. If comes in labor, I recommend treating if no swabs this pregnancy have been done.   Pregnancy resources personnel to see patient  today  **pt needs gbs, gc/ct swabs and cbc, rpr, hiv** - prenatal vitamin w/FE, FA (PRENATAL 1 + 1) 27-1 MG TABS tablet; Take 1 tablet by mouth daily at 12 noon.  Dispense: 30 tablet; Refill: 6  2. BMI 50.0-59.9, adult (HCC) Weight stable. To start qwk bpps tomorrow; 11/11 growth u/s normal. Has 39wk rpt c/s already scheduled.   3. Obesity in pregnancy  4. Urinary tract infection in mother during third trimester of pregnancy Recommended she leave toc. Pt to try and leave one tomorrow  5. History of GBS bacteriuria See above  6. History of cesarean delivery Rpt already scheduled. 11/19 btl paperwork not seen in media. RN to find out if paperwork just hasn't been scanned yet  7. [redacted] weeks gestation of pregnancy   Preterm labor symptoms and general obstetric precautions including but not limited to vaginal bleeding, contractions, leaking of fluid and fetal movement were reviewed in detail with the patient. Please refer to After Visit Summary for other counseling recommendations.  Return in about 8 days (around 04/29/2020) for in person, md or app.   Oceano Bing, MD

## 2020-04-22 ENCOUNTER — Encounter: Payer: Self-pay | Admitting: *Deleted

## 2020-04-22 ENCOUNTER — Other Ambulatory Visit: Payer: Self-pay

## 2020-04-22 ENCOUNTER — Ambulatory Visit: Payer: Medicaid Other | Admitting: *Deleted

## 2020-04-22 ENCOUNTER — Ambulatory Visit: Payer: Medicaid Other | Attending: Obstetrics and Gynecology

## 2020-04-22 DIAGNOSIS — O0990 Supervision of high risk pregnancy, unspecified, unspecified trimester: Secondary | ICD-10-CM | POA: Insufficient documentation

## 2020-04-22 DIAGNOSIS — Z3A36 36 weeks gestation of pregnancy: Secondary | ICD-10-CM

## 2020-04-22 DIAGNOSIS — O289 Unspecified abnormal findings on antenatal screening of mother: Secondary | ICD-10-CM

## 2020-04-22 DIAGNOSIS — Z98891 History of uterine scar from previous surgery: Secondary | ICD-10-CM | POA: Diagnosis not present

## 2020-04-22 DIAGNOSIS — O0933 Supervision of pregnancy with insufficient antenatal care, third trimester: Secondary | ICD-10-CM

## 2020-04-22 DIAGNOSIS — O34219 Maternal care for unspecified type scar from previous cesarean delivery: Secondary | ICD-10-CM

## 2020-04-22 DIAGNOSIS — O99213 Obesity complicating pregnancy, third trimester: Secondary | ICD-10-CM

## 2020-04-22 DIAGNOSIS — R638 Other symptoms and signs concerning food and fluid intake: Secondary | ICD-10-CM | POA: Diagnosis not present

## 2020-04-22 DIAGNOSIS — Z363 Encounter for antenatal screening for malformations: Secondary | ICD-10-CM

## 2020-04-22 DIAGNOSIS — E669 Obesity, unspecified: Secondary | ICD-10-CM | POA: Diagnosis not present

## 2020-04-22 DIAGNOSIS — Z3687 Encounter for antenatal screening for uncertain dates: Secondary | ICD-10-CM | POA: Diagnosis not present

## 2020-04-26 NOTE — Patient Instructions (Signed)
Terri Tran  04/26/2020   Your procedure is scheduled on:  05/09/2020  Arrive at 1030 at Entrance C on [address] at [facility]  and [employer]. You are invited to use the FREE valet parking or use the Visitor's parking deck.  Pick up the phone at the desk and dial [phone].  Call this number if you have problems the morning of surgery: [phone]  Remember:   Do not eat food:(After Midnight) Desps de medianoche.  Do not drink clear liquids: (After Midnight) Desps de medianoche.  Take these medicines the morning of surgery with A SIP OF WATER:  none   Do not wear jewelry, make-up or nail polish.  Do not wear lotions, powders, or perfumes. Do not wear deodorant.  Do not shave 48 hours prior to surgery.  Do not bring valuables to the hospital.  [facility] is not   responsible for any belongings or valuables brought to the hospital.  Contacts, dentures or bridgework may not be worn into surgery.  Leave suitcase in the car. After surgery it may be brought to your room.  For patients admitted to the hospital, checkout time is 11:00 AM the day of              discharge.      Please read over the following fact sheets that you were given:     Preparing for Surgery

## 2020-04-28 ENCOUNTER — Encounter (HOSPITAL_COMMUNITY): Payer: Self-pay

## 2020-04-29 ENCOUNTER — Ambulatory Visit: Payer: Medicaid Other | Admitting: *Deleted

## 2020-04-29 ENCOUNTER — Encounter: Payer: Self-pay | Admitting: *Deleted

## 2020-04-29 ENCOUNTER — Other Ambulatory Visit: Payer: Self-pay

## 2020-04-29 ENCOUNTER — Encounter: Payer: Self-pay | Admitting: Family Medicine

## 2020-04-29 ENCOUNTER — Ambulatory Visit (INDEPENDENT_AMBULATORY_CARE_PROVIDER_SITE_OTHER): Payer: Medicaid Other | Admitting: Family Medicine

## 2020-04-29 ENCOUNTER — Ambulatory Visit: Payer: Medicaid Other | Attending: Obstetrics and Gynecology

## 2020-04-29 ENCOUNTER — Other Ambulatory Visit (HOSPITAL_COMMUNITY)
Admission: RE | Admit: 2020-04-29 | Discharge: 2020-04-29 | Disposition: A | Discharge status: Home / Self Care | Payer: Medicaid Other | Source: Ambulatory Visit | Attending: Family Medicine | Admitting: Family Medicine

## 2020-04-29 VITALS — BP 127/76 | HR 91 | Wt 379.3 lb

## 2020-04-29 DIAGNOSIS — O99213 Obesity complicating pregnancy, third trimester: Secondary | ICD-10-CM | POA: Diagnosis not present

## 2020-04-29 DIAGNOSIS — E669 Obesity, unspecified: Secondary | ICD-10-CM | POA: Diagnosis not present

## 2020-04-29 DIAGNOSIS — R8271 Bacteriuria: Secondary | ICD-10-CM

## 2020-04-29 DIAGNOSIS — R638 Other symptoms and signs concerning food and fluid intake: Secondary | ICD-10-CM | POA: Diagnosis not present

## 2020-04-29 DIAGNOSIS — Z98891 History of uterine scar from previous surgery: Secondary | ICD-10-CM | POA: Diagnosis not present

## 2020-04-29 DIAGNOSIS — Z3687 Encounter for antenatal screening for uncertain dates: Secondary | ICD-10-CM

## 2020-04-29 DIAGNOSIS — O99210 Obesity complicating pregnancy, unspecified trimester: Secondary | ICD-10-CM | POA: Diagnosis not present

## 2020-04-29 DIAGNOSIS — O0933 Supervision of pregnancy with insufficient antenatal care, third trimester: Secondary | ICD-10-CM

## 2020-04-29 DIAGNOSIS — O34219 Maternal care for unspecified type scar from previous cesarean delivery: Secondary | ICD-10-CM | POA: Diagnosis not present

## 2020-04-29 DIAGNOSIS — F411 Generalized anxiety disorder: Secondary | ICD-10-CM | POA: Diagnosis not present

## 2020-04-29 DIAGNOSIS — Z23 Encounter for immunization: Secondary | ICD-10-CM | POA: Diagnosis not present

## 2020-04-29 DIAGNOSIS — Z363 Encounter for antenatal screening for malformations: Secondary | ICD-10-CM

## 2020-04-29 DIAGNOSIS — O0990 Supervision of high risk pregnancy, unspecified, unspecified trimester: Secondary | ICD-10-CM

## 2020-04-29 DIAGNOSIS — O2343 Unspecified infection of urinary tract in pregnancy, third trimester: Secondary | ICD-10-CM

## 2020-04-29 DIAGNOSIS — Z3A37 37 weeks gestation of pregnancy: Secondary | ICD-10-CM

## 2020-04-29 DIAGNOSIS — Z6841 Body Mass Index (BMI) 40.0 and over, adult: Secondary | ICD-10-CM | POA: Diagnosis not present

## 2020-04-29 NOTE — Addendum Note (Signed)
Addended by: Maxwell Marion E on: 04/29/2020 12:11 PM   Modules accepted: Orders

## 2020-04-29 NOTE — Progress Notes (Signed)
   Subjective:  Terri Tran is a 29 y.o. [phone] at [redacted]w[redacted]d being seen today for ongoing prenatal care.  She is currently monitored for the following issues for this high-risk pregnancy and has Obesity in pregnancy; BMI 50.0-59.9, adult (HCC); History of GBS bacteriuria; Depression; Generalized anxiety disorder; Supervision of high risk pregnancy, antepartum; UTI in pregnancy; and History of cesarean delivery on their problem list.  Patient reports anxiety.  Contractions: Not present. Vag. Bleeding: None.  Movement: Present. Denies leaking of fluid.   The following portions of the patient's history were reviewed and updated as appropriate: allergies, current medications, past family history, past medical history, past social history, past surgical history and problem list. Problem list updated.  Objective:   Vitals:   04/29/20 0845  BP: 127/76  Pulse: 91  Weight: (!) 379 lb 4.8 oz (172 kg)    Fetal Status:     Movement: Present     General:  Alert, oriented and cooperative. Patient is in no acute distress.  Skin: Skin is warm and dry. No rash noted.   Cardiovascular: Normal heart rate noted  Respiratory: Normal respiratory effort, no problems with respiration noted  Abdomen: Soft, gravid, appropriate for gestational age. Pain/Pressure: Present     Pelvic: Vag. Bleeding: None     Cervical exam deferred        Extremities: Normal range of motion.  Edema: Trace  Mental Status: Normal mood and affect. Normal behavior. Normal judgment and thought content.   Urinalysis:      Assessment and Plan:  Pregnancy: G4P3003 at [redacted]w[redacted]d  1. Supervision of high risk pregnancy, antepartum BP and FHR normal Swabs collected today BPP weekly 2/2 maternal obesity, 8/8 last week, next scheduled for today  2. History of cesarean delivery Scheduled for repeat on 05/09/2020 Review of last op note notes no intra-abdominal adhesions Desires BTL, papers signed 04/08/2020, will be past 30d mark at that  point  3. Generalized anxiety disorder Still dealing w anxiety  4. History of GBS bacteriuria Last pregnancy, repeat swabs today  5. Urinary tract infection in mother during third trimester of pregnancy TOC collected today  6. Obesity in pregnancy   7. BMI 50.0-59.9, adult [facility])   Term labor symptoms and general obstetric precautions including but not limited to vaginal bleeding, contractions, leaking of fluid and fetal movement were reviewed in detail with the patient. Please refer to After Visit Summary for other counseling recommendations.  Return in 1 week (on 05/06/2020) for [facility], ob visit.   Venora Maples, MD

## 2020-04-29 NOTE — Addendum Note (Signed)
Addended by: Maxwell Marion E on: 04/29/2020 10:41 AM   Modules accepted: Orders

## 2020-04-29 NOTE — Patient Instructions (Signed)
 Third Trimester of Pregnancy The third trimester is from week 28 through week 40 (months 7 through 9). The third trimester is a time when the unborn baby (fetus) is growing rapidly. At the end of the ninth month, the fetus is about 20 inches in length and weighs 6-10 pounds. Body changes during your third trimester Your body will continue to go through many changes during pregnancy. The changes vary from woman to woman. During the third trimester:  Your weight will continue to increase. You can expect to gain 25-35 pounds (11-16 kg) by the end of the pregnancy.  You may begin to get stretch marks on your hips, abdomen, and breasts.  You may urinate more often because the fetus is moving lower into your pelvis and pressing on your bladder.  You may develop or continue to have heartburn. This is caused by increased hormones that slow down muscles in the digestive tract.  You may develop or continue to have constipation because increased hormones slow digestion and cause the muscles that push waste through your intestines to relax.  You may develop hemorrhoids. These are swollen veins (varicose veins) in the rectum that can itch or be painful.  You may develop swollen, bulging veins (varicose veins) in your legs.  You may have increased body aches in the pelvis, back, or thighs. This is due to weight gain and increased hormones that are relaxing your joints.  You may have changes in your hair. These can include thickening of your hair, rapid growth, and changes in texture. Some women also have hair loss during or after pregnancy, or hair that feels dry or thin. Your hair will most likely return to normal after your baby is born.  Your breasts will continue to grow and they will continue to become tender. A yellow fluid (colostrum) may leak from your breasts. This is the first milk you are producing for your baby.  Your belly button may stick out.  You may notice more swelling in your  hands, face, or ankles.  You may have increased tingling or numbness in your hands, arms, and legs. The skin on your belly may also feel numb.  You may feel short of breath because of your expanding uterus.  You may have more problems sleeping. This can be caused by the size of your belly, increased need to urinate, and an increase in your body's metabolism.  You may notice the fetus "dropping," or moving lower in your abdomen (lightening).  You may have increased vaginal discharge.  You may notice your joints feel loose and you may have pain around your pelvic bone. What to expect at prenatal visits You will have prenatal exams every 2 weeks until week 36. Then you will have weekly prenatal exams. During a routine prenatal visit:  You will be weighed to make sure you and the baby are growing normally.  Your blood pressure will be taken.  Your abdomen will be measured to track your baby's growth.  The fetal heartbeat will be listened to.  Any test results from the previous visit will be discussed.  You may have a cervical check near your due date to see if your cervix has softened or thinned (effaced).  You will be tested for Group B streptococcus. This happens between 35 and 37 weeks. Your health care provider may ask you:  What your birth plan is.  How you are feeling.  If you are feeling the baby move.  If you have had any   abnormal symptoms, such as leaking fluid, bleeding, severe headaches, or abdominal cramping.  If you are using any tobacco products, including cigarettes, chewing tobacco, and electronic cigarettes.  If you have any questions. Other tests or screenings that may be performed during your third trimester include:  Blood tests that check for low iron levels (anemia).  Fetal testing to check the health, activity level, and growth of the fetus. Testing is done if you have certain medical conditions or if there are problems during the  pregnancy.  Nonstress test (NST). This test checks the health of your baby to make sure there are no signs of problems, such as the baby not getting enough oxygen. During this test, a belt is placed around your belly. The baby is made to move, and its heart rate is monitored during movement. What is false labor? False labor is a condition in which you feel small, irregular tightenings of the muscles in the womb (contractions) that usually go away with rest, changing position, or drinking water. These are called Braxton Hicks contractions. Contractions may last for hours, days, or even weeks before true labor sets in. If contractions come at regular intervals, become more frequent, increase in intensity, or become painful, you should see your health care provider. What are the signs of labor?  Abdominal cramps.  Regular contractions that start at 10 minutes apart and become stronger and more frequent with time.  Contractions that start on the top of the uterus and spread down to the lower abdomen and back.  Increased pelvic pressure and dull back pain.  A watery or bloody mucus discharge that comes from the vagina.  Leaking of amniotic fluid. This is also known as your "water breaking." It could be a slow trickle or a gush. Let your health care provider know if it has a color or strange odor. If you have any of these signs, call your health care provider right away, even if it is before your due date. Follow these instructions at home: Medicines  Follow your health care provider's instructions regarding medicine use. Specific medicines may be either safe or unsafe to take during pregnancy.  Take a prenatal vitamin that contains at least 600 micrograms (mcg) of folic acid.  If you develop constipation, try taking a stool softener if your health care provider approves. Eating and drinking   Eat a balanced diet that includes fresh fruits and vegetables, whole grains, good sources of protein  such as meat, eggs, or tofu, and low-fat dairy. Your health care provider will help you determine the amount of weight gain that is right for you.  Avoid raw meat and uncooked cheese. These carry germs that can cause birth defects in the baby.  If you have low calcium intake from food, talk to your health care provider about whether you should take a daily calcium supplement.  Eat four or five small meals rather than three large meals a day.  Limit foods that are high in fat and processed sugars, such as fried and sweet foods.  To prevent constipation: ? Drink enough fluid to keep your urine clear or pale yellow. ? Eat foods that are high in fiber, such as fresh fruits and vegetables, whole grains, and beans. Activity  Exercise only as directed by your health care provider. Most women can continue their usual exercise routine during pregnancy. Try to exercise for 30 minutes at least 5 days a week. Stop exercising if you experience uterine contractions.  Avoid heavy lifting.    Do not exercise in extreme heat or humidity, or at high altitudes.  Wear low-heel, comfortable shoes.  Practice good posture.  You may continue to have sex unless your health care provider tells you otherwise. Relieving pain and discomfort  Take frequent breaks and rest with your legs elevated if you have leg cramps or low back pain.  Take warm sitz baths to soothe any pain or discomfort caused by hemorrhoids. Use hemorrhoid cream if your health care provider approves.  Wear a good support bra to prevent discomfort from breast tenderness.  If you develop varicose veins: ? Wear support pantyhose or compression stockings as told by your healthcare provider. ? Elevate your feet for 15 minutes, 3-4 times a day. Prenatal care  Write down your questions. Take them to your prenatal visits.  Keep all your prenatal visits as told by your health care provider. This is important. Safety  Wear your seat belt at  all times when driving.  Make a list of emergency phone numbers, including numbers for family, friends, the hospital, and police and fire departments. General instructions  Avoid cat litter boxes and soil used by cats. These carry germs that can cause birth defects in the baby. If you have a cat, ask someone to clean the litter box for you.  Do not travel far distances unless it is absolutely necessary and only with the approval of your health care provider.  Do not use hot tubs, steam rooms, or saunas.  Do not drink alcohol.  Do not use any products that contain nicotine or tobacco, such as cigarettes and e-cigarettes. If you need help quitting, ask your health care provider.  Do not use any medicinal herbs or unprescribed drugs. These chemicals affect the formation and growth of the baby.  Do not douche or use tampons or scented sanitary pads.  Do not cross your legs for long periods of time.  To prepare for the arrival of your baby: ? Take prenatal classes to understand, practice, and ask questions about labor and delivery. ? Make a trial run to the hospital. ? Visit the hospital and tour the maternity area. ? Arrange for maternity or paternity leave through employers. ? Arrange for family and friends to take care of pets while you are in the hospital. ? Purchase a rear-facing car seat and make sure you know how to install it in your car. ? Pack your hospital bag. ? Prepare the baby's nursery. Make sure to remove all pillows and stuffed animals from the baby's crib to prevent suffocation.  Visit your dentist if you have not gone during your pregnancy. Use a soft toothbrush to brush your teeth and be gentle when you floss. Contact a health care provider if:  You are unsure if you are in labor or if your water has broken.  You become dizzy.  You have mild pelvic cramps, pelvic pressure, or nagging pain in your abdominal area.  You have lower back pain.  You have persistent  nausea, vomiting, or diarrhea.  You have an unusual or bad smelling vaginal discharge.  You have pain when you urinate. Get help right away if:  Your water breaks before 37 weeks.  You have regular contractions less than 5 minutes apart before 37 weeks.  You have a fever.  You are leaking fluid from your vagina.  You have spotting or bleeding from your vagina.  You have severe abdominal pain or cramping.  You have rapid weight loss or weight gain.  You   have shortness of breath with chest pain.  You notice sudden or extreme swelling of your face, hands, ankles, feet, or legs.  Your baby makes fewer than 10 movements in 2 hours.  You have severe headaches that do not go away when you take medicine.  You have vision changes. Summary  The third trimester is from week 28 through week 40, months 7 through 9. The third trimester is a time when the unborn baby (fetus) is growing rapidly.  During the third trimester, your discomfort may increase as you and your baby continue to gain weight. You may have abdominal, leg, and back pain, sleeping problems, and an increased need to urinate.  During the third trimester your breasts will keep growing and they will continue to become tender. A yellow fluid (colostrum) may leak from your breasts. This is the first milk you are producing for your baby.  False labor is a condition in which you feel small, irregular tightenings of the muscles in the womb (contractions) that eventually go away. These are called Braxton Hicks contractions. Contractions may last for hours, days, or even weeks before true labor sets in.  Signs of labor can include: abdominal cramps; regular contractions that start at 10 minutes apart and become stronger and more frequent with time; watery or bloody mucus discharge that comes from the vagina; increased pelvic pressure and dull back pain; and leaking of amniotic fluid. This information is not intended to replace advice  given to you by your health care provider. Make sure you discuss any questions you have with your health care provider. Document Revised: 08/28/2018 Document Reviewed: 06/12/2016 [employer] Patient Education  2020 [employer]   Contraception Choices Contraception, also called birth control, refers to methods or devices that prevent pregnancy. Hormonal methods Contraceptive implant  A contraceptive implant is a thin, plastic tube that contains a hormone. It is inserted into the upper part of the arm. It can remain in place for up to 3 years. Progestin-only injections Progestin-only injections are injections of progestin, a synthetic form of the hormone progesterone. They are given every 3 months by a health care provider. Birth control pills  Birth control pills are pills that contain hormones that prevent pregnancy. They must be taken once a day, preferably at the same time each day. Birth control patch  The birth control patch contains hormones that prevent pregnancy. It is placed on the skin and must be changed once a week for three weeks and removed on the fourth week. A prescription is needed to use this method of contraception. Vaginal ring  A vaginal ring contains hormones that prevent pregnancy. It is placed in the vagina for three weeks and removed on the fourth week. After that, the process is repeated with a new ring. A prescription is needed to use this method of contraception. Emergency contraceptive Emergency contraceptives prevent pregnancy after unprotected sex. They come in pill form and can be taken up to 5 days after sex. They work best the sooner they are taken after having sex. Most emergency contraceptives are available without a prescription. This method should not be used as your only form of birth control. Barrier methods Female condom  A female condom is a thin sheath that is worn over the penis during sex. Condoms keep sperm from going inside a woman's body. They can  be used with a spermicide to increase their effectiveness. They should be disposed after a single use. Female condom  A female condom is a soft,   loose-fitting sheath that is put into the vagina before sex. The condom keeps sperm from going inside a woman's body. They should be disposed after a single use. Diaphragm  A diaphragm is a soft, dome-shaped barrier. It is inserted into the vagina before sex, along with a spermicide. The diaphragm blocks sperm from entering the uterus, and the spermicide kills sperm. A diaphragm should be left in the vagina for 6-8 hours after sex and removed within 24 hours. A diaphragm is prescribed and fitted by a health care provider. A diaphragm should be replaced every 1-2 years, after giving birth, after gaining more than 15 lb (6.8 kg), and after pelvic surgery. Cervical cap  A cervical cap is a round, soft latex or plastic cup that fits over the cervix. It is inserted into the vagina before sex, along with spermicide. It blocks sperm from entering the uterus. The cap should be left in place for 6-8 hours after sex and removed within 48 hours. A cervical cap must be prescribed and fitted by a health care provider. It should be replaced every 2 years. Sponge  A sponge is a soft, circular piece of polyurethane foam with spermicide on it. The sponge helps block sperm from entering the uterus, and the spermicide kills sperm. To use it, you make it wet and then insert it into the vagina. It should be inserted before sex, left in for at least 6 hours after sex, and removed and thrown away within 30 hours. Spermicides Spermicides are chemicals that kill or block sperm from entering the cervix and uterus. They can come as a cream, jelly, suppository, foam, or tablet. A spermicide should be inserted into the vagina with an applicator at least 10-15 minutes before sex to allow time for it to work. The process must be repeated every time you have sex. Spermicides do not require  a prescription. Intrauterine contraception Intrauterine device (IUD) An IUD is a T-shaped device that is put in a woman's uterus. There are two types:  Hormone IUD.This type contains progestin, a synthetic form of the hormone progesterone. This type can stay in place for 3-5 years.  Copper IUD.This type is wrapped in copper wire. It can stay in place for 10 years.  Permanent methods of contraception Female tubal ligation In this method, a woman's fallopian tubes are sealed, tied, or blocked during surgery to prevent eggs from traveling to the uterus. Hysteroscopic sterilization In this method, a small, flexible insert is placed into each fallopian tube. The inserts cause scar tissue to form in the fallopian tubes and block them, so sperm cannot reach an egg. The procedure takes about 3 months to be effective. Another form of birth control must be used during those 3 months. Female sterilization This is a procedure to tie off the tubes that carry sperm (vasectomy). After the procedure, the man can still ejaculate fluid (semen). Natural planning methods Natural family planning In this method, a couple does not have sex on days when the woman could become pregnant. Calendar method This means keeping track of the length of each menstrual cycle, identifying the days when pregnancy can happen, and not having sex on those days. Ovulation method In this method, a couple avoids sex during ovulation. Symptothermal method This method involves not having sex during ovulation. The woman typically checks for ovulation by watching changes in her temperature and in the consistency of cervical mucus. Post-ovulation method In this method, a couple waits to have sex until after ovulation. Summary    Contraception, also called birth control, means methods or devices that prevent pregnancy.  Hormonal methods of contraception include implants, injections, pills, patches, vaginal rings, and emergency  contraceptives.  Barrier methods of contraception can include female condoms, female condoms, diaphragms, cervical caps, sponges, and spermicides.  There are two types of IUDs (intrauterine devices). An IUD can be put in a woman's uterus to prevent pregnancy for 3-5 years.  Permanent sterilization can be done through a procedure for males, females, or both.  Natural family planning methods involve not having sex on days when the woman could become pregnant. This information is not intended to replace advice given to you by your health care provider. Make sure you discuss any questions you have with your health care provider. Document Revised: 05/09/2017 Document Reviewed: 06/09/2016 [employer] Patient Education  2020 [employer]   Breastfeeding  Choosing to breastfeed is one of the best decisions you can make for yourself and your baby. A change in hormones during pregnancy causes your breasts to make breast milk in your milk-producing glands. Hormones prevent breast milk from being released before your baby is born. They also prompt milk flow after birth. Once breastfeeding has begun, thoughts of your baby, as well as his or her sucking or crying, can stimulate the release of milk from your milk-producing glands. Benefits of breastfeeding Research shows that breastfeeding offers many health benefits for infants and mothers. It also offers a cost-free and convenient way to feed your baby. For your baby  Your first milk (colostrum) helps your baby's digestive system to function better.  Special cells in your milk (antibodies) help your baby to fight off infections.  Breastfed babies are less likely to develop asthma, allergies, obesity, or type 2 diabetes. They are also at lower risk for sudden infant death syndrome (SIDS).  Nutrients in breast milk are better able to meet your baby's needs compared to infant formula.  Breast milk improves your baby's brain development. For  you  Breastfeeding helps to create a very special bond between you and your baby.  Breastfeeding is convenient. Breast milk costs nothing and is always available at the correct temperature.  Breastfeeding helps to burn calories. It helps you to lose the weight that you gained during pregnancy.  Breastfeeding makes your uterus return faster to its size before pregnancy. It also slows bleeding (lochia) after you give birth.  Breastfeeding helps to lower your risk of developing type 2 diabetes, osteoporosis, rheumatoid arthritis, cardiovascular disease, and breast, ovarian, uterine, and endometrial cancer later in life. Breastfeeding basics Starting breastfeeding  Find a comfortable place to sit or lie down, with your neck and back well-supported.  Place a pillow or a rolled-up blanket under your baby to bring him or her to the level of your breast (if you are seated). Nursing pillows are specially designed to help support your arms and your baby while you breastfeed.  Make sure that your baby's tummy (abdomen) is facing your abdomen.  Gently massage your breast. With your fingertips, massage from the outer edges of your breast inward toward the nipple. This encourages milk flow. If your milk flows slowly, you may need to continue this action during the feeding.  Support your breast with 4 fingers underneath and your thumb above your nipple (make the letter "C" with your hand). Make sure your fingers are well away from your nipple and your baby's mouth.  Stroke your baby's lips gently with your finger or nipple.  When your baby's mouth is open   wide enough, quickly bring your baby to your breast, placing your entire nipple and as much of the areola as possible into your baby's mouth. The areola is the colored area around your nipple. ? More areola should be visible above your baby's upper lip than below the lower lip. ? Your baby's lips should be opened and extended outward (flanged) to  ensure an adequate, comfortable latch. ? Your baby's tongue should be between his or her lower gum and your breast.  Make sure that your baby's mouth is correctly positioned around your nipple (latched). Your baby's lips should create a seal on your breast and be turned out (everted).  It is common for your baby to suck about 2-3 minutes in order to start the flow of breast milk. Latching Teaching your baby how to latch onto your breast properly is very important. An improper latch can cause nipple pain, decreased milk supply, and poor weight gain in your baby. Also, if your baby is not latched onto your nipple properly, he or she may swallow some air during feeding. This can make your baby fussy. Burping your baby when you switch breasts during the feeding can help to get rid of the air. However, teaching your baby to latch on properly is still the best way to prevent fussiness from swallowing air while breastfeeding. Signs that your baby has successfully latched onto your nipple  Silent tugging or silent sucking, without causing you pain. Infant's lips should be extended outward (flanged).  Swallowing heard between every 3-4 sucks once your milk has started to flow (after your let-down milk reflex occurs).  Muscle movement above and in front of his or her ears while sucking. Signs that your baby has not successfully latched onto your nipple  Sucking sounds or smacking sounds from your baby while breastfeeding.  Nipple pain. If you think your baby has not latched on correctly, slip your finger into the corner of your baby's mouth to break the suction and place it between your baby's gums. Attempt to start breastfeeding again. Signs of successful breastfeeding Signs from your baby  Your baby will gradually decrease the number of sucks or will completely stop sucking.  Your baby will fall asleep.  Your baby's body will relax.  Your baby will retain a small amount of milk in his or her  mouth.  Your baby will let go of your breast by himself or herself. Signs from you  Breasts that have increased in firmness, weight, and size 1-3 hours after feeding.  Breasts that are softer immediately after breastfeeding.  Increased milk volume, as well as a change in milk consistency and color by the fifth day of breastfeeding.  Nipples that are not sore, cracked, or bleeding. Signs that your baby is getting enough milk  Wetting at least 1-2 diapers during the first 24 hours after birth.  Wetting at least 5-6 diapers every 24 hours for the first week after birth. The urine should be clear or pale yellow by the age of 5 days.  Wetting 6-8 diapers every 24 hours as your baby continues to grow and develop.  At least 3 stools in a 24-hour period by the age of 5 days. The stool should be soft and yellow.  At least 3 stools in a 24-hour period by the age of 7 days. The stool should be seedy and yellow.  No loss of weight greater than 10% of birth weight during the first 3 days of life.  Average weight gain   of 4-7 oz (113-198 g) per week after the age of 4 days.  Consistent daily weight gain by the age of 5 days, without weight loss after the age of 2 weeks. After a feeding, your baby may spit up a small amount of milk. This is normal. Breastfeeding frequency and duration Frequent feeding will help you make more milk and can prevent sore nipples and extremely full breasts (breast engorgement). Breastfeed when you feel the need to reduce the fullness of your breasts or when your baby shows signs of hunger. This is called "breastfeeding on demand." Signs that your baby is hungry include:  Increased alertness, activity, or restlessness.  Movement of the head from side to side.  Opening of the mouth when the corner of the mouth or cheek is stroked (rooting).  Increased sucking sounds, smacking lips, cooing, sighing, or squeaking.  Hand-to-mouth movements and sucking on fingers or  hands.  Fussing or crying. Avoid introducing a pacifier to your baby in the first 4-6 weeks after your baby is born. After this time, you may choose to use a pacifier. Research has shown that pacifier use during the first year of a baby's life decreases the risk of sudden infant death syndrome (SIDS). Allow your baby to feed on each breast as long as he or she wants. When your baby unlatches or falls asleep while feeding from the first breast, offer the second breast. Because newborns are often sleepy in the first few weeks of life, you may need to awaken your baby to get him or her to feed. Breastfeeding times will vary from baby to baby. However, the following rules can serve as a guide to help you make sure that your baby is properly fed:  Newborns (babies 4 weeks of age or younger) may breastfeed every 1-3 hours.  Newborns should not go without breastfeeding for longer than 3 hours during the day or 5 hours during the night.  You should breastfeed your baby a minimum of 8 times in a 24-hour period. Breast milk pumping     Pumping and storing breast milk allows you to make sure that your baby is exclusively fed your breast milk, even at times when you are unable to breastfeed. This is especially important if you go back to work while you are still breastfeeding, or if you are not able to be present during feedings. Your lactation consultant can help you find a method of pumping that works best for you and give you guidelines about how long it is safe to store breast milk. Caring for your breasts while you breastfeed Nipples can become dry, cracked, and sore while breastfeeding. The following recommendations can help keep your breasts moisturized and healthy:  Avoid using soap on your nipples.  Wear a supportive bra designed especially for nursing. Avoid wearing underwire-style bras or extremely tight bras (sports bras).  Air-dry your nipples for 3-4 minutes after each feeding.  Use only  cotton bra pads to absorb leaked breast milk. Leaking of breast milk between feedings is normal.  Use lanolin on your nipples after breastfeeding. Lanolin helps to maintain your skin's normal moisture barrier. Pure lanolin is not harmful (not toxic) to your baby. You may also hand express a few drops of breast milk and gently massage that milk into your nipples and allow the milk to air-dry. In the first few weeks after giving birth, some women experience breast engorgement. Engorgement can make your breasts feel heavy, warm, and tender to the touch. Engorgement   peaks within 3-5 days after you give birth. The following recommendations can help to ease engorgement:  Completely empty your breasts while breastfeeding or pumping. You may want to start by applying warm, moist heat (in the shower or with warm, water-soaked hand towels) just before feeding or pumping. This increases circulation and helps the milk flow. If your baby does not completely empty your breasts while breastfeeding, pump any extra milk after he or she is finished.  Apply ice packs to your breasts immediately after breastfeeding or pumping, unless this is too uncomfortable for you. To do this: ? Put ice in a plastic bag. ? Place a towel between your skin and the bag. ? Leave the ice on for 20 minutes, 2-3 times a day.  Make sure that your baby is latched on and positioned properly while breastfeeding. If engorgement persists after 48 hours of following these recommendations, contact your health care provider or a lactation consultant. Overall health care recommendations while breastfeeding  Eat 3 healthy meals and 3 snacks every day. Well-nourished mothers who are breastfeeding need an additional 450-500 calories a day. You can meet this requirement by increasing the amount of a balanced diet that you eat.  Drink enough water to keep your urine pale yellow or clear.  Rest often, relax, and continue to take your prenatal vitamins  to prevent fatigue, stress, and low vitamin and mineral levels in your body (nutrient deficiencies).  Do not use any products that contain nicotine or tobacco, such as cigarettes and e-cigarettes. Your baby may be harmed by chemicals from cigarettes that pass into breast milk and exposure to secondhand smoke. If you need help quitting, ask your health care provider.  Avoid alcohol.  Do not use illegal drugs or marijuana.  Talk with your health care provider before taking any medicines. These include over-the-counter and prescription medicines as well as vitamins and herbal supplements. Some medicines that may be harmful to your baby can pass through breast milk.  It is possible to become pregnant while breastfeeding. If birth control is desired, ask your health care provider about options that will be safe while breastfeeding your baby. Where to find more information: [employer]: www.llli.org Contact a health care provider if:  You feel like you want to stop breastfeeding or have become frustrated with breastfeeding.  Your nipples are cracked or bleeding.  Your breasts are red, tender, or warm.  You have: ? Painful breasts or nipples. ? A swollen area on either breast. ? A fever or chills. ? Nausea or vomiting. ? Drainage other than breast milk from your nipples.  Your breasts do not become full before feedings by the fifth day after you give birth.  You feel sad and depressed.  Your baby is: ? Too sleepy to eat well. ? Having trouble sleeping. ? More than 1 week old and wetting fewer than 6 diapers in a 24-hour period. ? Not gaining weight by 5 days of age.  Your baby has fewer than 3 stools in a 24-hour period.  Your baby's skin or the white parts of his or her eyes become yellow. Get help right away if:  Your baby is overly tired (lethargic) and does not want to wake up and feed.  Your baby develops an unexplained fever. Summary  Breastfeeding  offers many health benefits for infant and mothers.  Try to breastfeed your infant when he or she shows early signs of hunger.  Gently tickle or stroke your baby's lips with   your finger or nipple to allow the baby to open his or her mouth. Bring the baby to your breast. Make sure that much of the areola is in your baby's mouth. Offer one side and burp the baby before you offer the other side.  Talk with your health care provider or lactation consultant if you have questions or you face problems as you breastfeed. This information is not intended to replace advice given to you by your health care provider. Make sure you discuss any questions you have with your health care provider. Document Revised: 08/01/2017 Document Reviewed: 06/08/2016 Elsevier Patient Education  2020 Elsevier Inc.  

## 2020-05-02 LAB — GC/CHLAMYDIA PROBE AMP (~~LOC~~) NOT AT ARMC
Chlamydia: NEGATIVE
Comment: NEGATIVE
Comment: NORMAL
Neisseria Gonorrhea: NEGATIVE

## 2020-05-03 ENCOUNTER — Telehealth: Payer: Self-pay | Admitting: Licensed Clinical Social Worker

## 2020-05-03 ENCOUNTER — Encounter: Payer: Medicaid Other | Admitting: Licensed Clinical Social Worker

## 2020-05-03 LAB — CULTURE, BETA STREP (GROUP B ONLY): Strep Gp B Culture: NEGATIVE

## 2020-05-03 NOTE — Telephone Encounter (Signed)
LCSWA A. Felton Clinton called pt. Terri Tran reports she can not complete visit.

## 2020-05-06 ENCOUNTER — Other Ambulatory Visit: Payer: Medicaid Other

## 2020-05-06 ENCOUNTER — Telehealth: Payer: Self-pay | Admitting: Obstetrics and Gynecology

## 2020-05-06 ENCOUNTER — Other Ambulatory Visit (HOSPITAL_COMMUNITY)
Admission: RE | Admit: 2020-05-06 | Discharge: 2020-05-06 | Disposition: A | Discharge status: Home / Self Care | Payer: Medicaid Other | Source: Ambulatory Visit | Attending: Family Medicine | Admitting: Family Medicine

## 2020-05-06 ENCOUNTER — Other Ambulatory Visit: Payer: Self-pay

## 2020-05-06 ENCOUNTER — Ambulatory Visit: Payer: Medicaid Other

## 2020-05-06 ENCOUNTER — Encounter
Admission: RE | Admit: 2020-05-06 | Discharge: 2020-05-06 | Disposition: A | Discharge status: Home / Self Care | Payer: Medicaid Other | Source: Ambulatory Visit | Attending: Family Medicine | Admitting: Family Medicine

## 2020-05-06 DIAGNOSIS — Z01812 Encounter for preprocedural laboratory examination: Secondary | ICD-10-CM | POA: Insufficient documentation

## 2020-05-06 DIAGNOSIS — Z20822 Contact with and (suspected) exposure to covid-19: Secondary | ICD-10-CM | POA: Insufficient documentation

## 2020-05-06 LAB — CBC
HCT: 36.5 % (ref 36.0–46.0)
Hemoglobin: 11.9 g/dL — ABNORMAL LOW (ref 12.0–15.0)
MCH: 27.5 pg (ref 26.0–34.0)
MCHC: 32.6 g/dL (ref 30.0–36.0)
MCV: 84.3 fL (ref 80.0–100.0)
Platelets: 346 10*3/uL (ref 150–400)
RBC: 4.33 MIL/uL (ref 3.87–5.11)
RDW: 13.9 % (ref 11.5–15.5)
WBC: 11.6 10*3/uL — ABNORMAL HIGH (ref 4.0–10.5)
nRBC: 0 % (ref 0.0–0.2)

## 2020-05-06 LAB — COMPREHENSIVE METABOLIC PANEL
ALT: 15 U/L (ref 0–44)
AST: 12 U/L — ABNORMAL LOW (ref 15–41)
Albumin: 2.8 g/dL — ABNORMAL LOW (ref 3.5–5.0)
Alkaline Phosphatase: 56 U/L (ref 38–126)
Anion gap: 11 (ref 5–15)
BUN: 9 mg/dL (ref 6–20)
CO2: 20 mmol/L — ABNORMAL LOW (ref 22–32)
Calcium: 9.6 mg/dL (ref 8.9–10.3)
Chloride: 105 mmol/L (ref 98–111)
Creatinine, Ser: 0.68 mg/dL (ref 0.44–1.00)
GFR, Estimated: >60 mL/min (ref >60)
Glucose, Bld: 86 mg/dL (ref 70–99)
Potassium: 3.8 mmol/L (ref 3.5–5.1)
Sodium: 136 mmol/L (ref 135–145)
Total Bilirubin: 0.3 mg/dL (ref 0.3–1.2)
Total Protein: 6.3 g/dL — ABNORMAL LOW (ref 6.5–8.1)

## 2020-05-06 LAB — SARS CORONAVIRUS 2 (TAT 6-24 HRS): SARS Coronavirus 2: NEGATIVE

## 2020-05-06 LAB — TYPE AND SCREEN
ABO/RH(D): A POS
Antibody Screen: NEGATIVE

## 2020-05-06 NOTE — Telephone Encounter (Signed)
PATIENT CALLED TO CANCEL SHE SAID SHE HAD A LOT GOING ON. I ASKED IF SHE WOULD LIKE TO RESCHEDULE BUT SHE SAID NO.

## 2020-05-07 LAB — RPR: RPR Ser Ql: NONREACTIVE

## 2020-05-09 ENCOUNTER — Inpatient Hospital Stay (HOSPITAL_COMMUNITY)
Admission: RE | Admit: 2020-05-09 | Discharge: 2020-05-11 | DRG: 785 | Disposition: A | Discharge status: Home / Self Care | Payer: Medicaid Other | Attending: Family Medicine | Admitting: Family Medicine

## 2020-05-09 ENCOUNTER — Inpatient Hospital Stay (HOSPITAL_COMMUNITY): Payer: Medicaid Other | Admitting: Anesthesiology

## 2020-05-09 ENCOUNTER — Encounter (HOSPITAL_COMMUNITY): Payer: Self-pay | Admitting: Family Medicine

## 2020-05-09 ENCOUNTER — Encounter (HOSPITAL_COMMUNITY)
Admission: RE | Disposition: A | Discharge status: Home / Self Care | Payer: Self-pay | Source: Home / Self Care | Attending: Family Medicine

## 2020-05-09 ENCOUNTER — Encounter: Payer: Self-pay | Admitting: Obstetrics & Gynecology

## 2020-05-09 DIAGNOSIS — O99344 Other mental disorders complicating childbirth: Secondary | ICD-10-CM | POA: Diagnosis present

## 2020-05-09 DIAGNOSIS — Z6841 Body Mass Index (BMI) 40.0 and over, adult: Secondary | ICD-10-CM

## 2020-05-09 DIAGNOSIS — Z20822 Contact with and (suspected) exposure to covid-19: Secondary | ICD-10-CM | POA: Diagnosis present

## 2020-05-09 DIAGNOSIS — F32A Depression, unspecified: Secondary | ICD-10-CM | POA: Diagnosis present

## 2020-05-09 DIAGNOSIS — Z87891 Personal history of nicotine dependence: Secondary | ICD-10-CM

## 2020-05-09 DIAGNOSIS — Z302 Encounter for sterilization: Secondary | ICD-10-CM

## 2020-05-09 DIAGNOSIS — F410 Panic disorder [episodic paroxysmal anxiety] without agoraphobia: Secondary | ICD-10-CM | POA: Diagnosis present

## 2020-05-09 DIAGNOSIS — O34211 Maternal care for low transverse scar from previous cesarean delivery: Secondary | ICD-10-CM | POA: Diagnosis not present

## 2020-05-09 DIAGNOSIS — Z9851 Tubal ligation status: Secondary | ICD-10-CM

## 2020-05-09 DIAGNOSIS — O99214 Obesity complicating childbirth: Secondary | ICD-10-CM | POA: Diagnosis present

## 2020-05-09 DIAGNOSIS — O134 Gestational [pregnancy-induced] hypertension without significant proteinuria, complicating childbirth: Secondary | ICD-10-CM | POA: Diagnosis present

## 2020-05-09 DIAGNOSIS — Z3A39 39 weeks gestation of pregnancy: Secondary | ICD-10-CM | POA: Diagnosis not present

## 2020-05-09 DIAGNOSIS — O0990 Supervision of high risk pregnancy, unspecified, unspecified trimester: Secondary | ICD-10-CM

## 2020-05-09 DIAGNOSIS — F411 Generalized anxiety disorder: Secondary | ICD-10-CM | POA: Diagnosis present

## 2020-05-09 DIAGNOSIS — Z98891 History of uterine scar from previous surgery: Secondary | ICD-10-CM

## 2020-05-09 DIAGNOSIS — O99210 Obesity complicating pregnancy, unspecified trimester: Secondary | ICD-10-CM | POA: Diagnosis present

## 2020-05-09 DIAGNOSIS — O2340 Unspecified infection of urinary tract in pregnancy, unspecified trimester: Secondary | ICD-10-CM | POA: Diagnosis present

## 2020-05-09 DIAGNOSIS — O139 Gestational [pregnancy-induced] hypertension without significant proteinuria, unspecified trimester: Secondary | ICD-10-CM

## 2020-05-09 SURGERY — Surgical Case
Anesthesia: Spinal

## 2020-05-09 MED ORDER — OXYTOCIN-SODIUM CHLORIDE 30-0.9 UT/500ML-% IV SOLN
INTRAVENOUS | Status: DC | PRN
  Administered 2020-05-09 (×2): 200 mL via INTRAVENOUS
  Administered 2020-05-09: 300 mL via INTRAVENOUS

## 2020-05-09 MED ORDER — NALBUPHINE HCL 10 MG/ML IJ SOLN: 5.0000 mg | INTRAMUSCULAR | Status: DC | PRN

## 2020-05-09 MED ORDER — MEPERIDINE HCL 25 MG/ML IJ SOLN: 6.2500 mg | INTRAMUSCULAR | Status: DC | PRN

## 2020-05-09 MED ORDER — MORPHINE SULFATE (PF) 0.5 MG/ML IJ SOLN
INTRAMUSCULAR | Status: AC
  Filled 2020-05-09: qty 10

## 2020-05-09 MED ORDER — SIMETHICONE 80 MG PO CHEW: 80.0000 mg | CHEWABLE_TABLET | ORAL | Status: DC | PRN

## 2020-05-09 MED ORDER — KETOROLAC TROMETHAMINE 30 MG/ML IJ SOLN
30.0000 mg | Freq: Once | INTRAMUSCULAR | Status: AC
  Administered 2020-05-09: 14:00:00 30 mg via INTRAVENOUS

## 2020-05-09 MED ORDER — OXYTOCIN-SODIUM CHLORIDE 30-0.9 UT/500ML-% IV SOLN: 2.5000 [IU]/h | INTRAVENOUS | Status: AC

## 2020-05-09 MED ORDER — LACTATED RINGERS IV SOLN: INTRAVENOUS | Status: DC | PRN

## 2020-05-09 MED ORDER — DEXAMETHASONE SODIUM PHOSPHATE 4 MG/ML IJ SOLN
INTRAMUSCULAR | Status: DC | PRN
  Administered 2020-05-09: 10 mg via INTRAVENOUS

## 2020-05-09 MED ORDER — FENTANYL CITRATE (PF) 100 MCG/2ML IJ SOLN
25.0000 ug | INTRAMUSCULAR | Status: DC | PRN
  Administered 2020-05-09 (×2): 25 ug via INTRAVENOUS

## 2020-05-09 MED ORDER — PROMETHAZINE HCL 25 MG/ML IJ SOLN: 6.2500 mg | INTRAMUSCULAR | Status: DC | PRN

## 2020-05-09 MED ORDER — ACETAMINOPHEN 500 MG PO TABS
1000.0000 mg | ORAL_TABLET | Freq: Once | ORAL | Status: AC
  Administered 2020-05-09: 12:00:00 1000 mg via ORAL

## 2020-05-09 MED ORDER — SCOPOLAMINE 1 MG/3DAYS TD PT72
MEDICATED_PATCH | TRANSDERMAL | Status: AC
  Filled 2020-05-09: qty 1

## 2020-05-09 MED ORDER — DIPHENHYDRAMINE HCL 25 MG PO CAPS: 25.0000 mg | ORAL_CAPSULE | ORAL | Status: DC | PRN

## 2020-05-09 MED ORDER — ACETAMINOPHEN 325 MG PO TABS
650.0000 mg | ORAL_TABLET | ORAL | Status: DC | PRN
  Administered 2020-05-09 – 2020-05-11 (×3): 650 mg via ORAL
  Filled 2020-05-09 (×3): qty 2

## 2020-05-09 MED ORDER — DIBUCAINE (PERIANAL) 1 % EX OINT: 1.0000 "application " | TOPICAL_OINTMENT | CUTANEOUS | Status: DC | PRN

## 2020-05-09 MED ORDER — WITCH HAZEL-GLYCERIN EX PADS: 1.0000 "application " | MEDICATED_PAD | CUTANEOUS | Status: DC | PRN

## 2020-05-09 MED ORDER — SODIUM CHLORIDE 0.9 % IR SOLN
Status: DC | PRN
  Administered 2020-05-09: 1

## 2020-05-09 MED ORDER — NALBUPHINE HCL 10 MG/ML IJ SOLN: 5.0000 mg | Freq: Once | INTRAMUSCULAR | Status: AC | PRN

## 2020-05-09 MED ORDER — MENTHOL 3 MG MT LOZG: 1.0000 | LOZENGE | OROMUCOSAL | Status: DC | PRN

## 2020-05-09 MED ORDER — LACTATED RINGERS IV SOLN: INTRAVENOUS | Status: DC

## 2020-05-09 MED ORDER — COCONUT OIL OIL: 1.0000 "application " | TOPICAL_OIL | Status: DC | PRN

## 2020-05-09 MED ORDER — IBUPROFEN 800 MG PO TABS
800.0000 mg | ORAL_TABLET | Freq: Four times a day (QID) | ORAL | Status: DC
  Administered 2020-05-10 – 2020-05-11 (×3): 800 mg via ORAL
  Filled 2020-05-09 (×3): qty 1

## 2020-05-09 MED ORDER — DEXTROSE 5 % IV SOLN
INTRAVENOUS | Status: AC
  Filled 2020-05-09: qty 3000

## 2020-05-09 MED ORDER — SOD CITRATE-CITRIC ACID 500-334 MG/5ML PO SOLN
ORAL | Status: AC
  Filled 2020-05-09: qty 30

## 2020-05-09 MED ORDER — MORPHINE SULFATE (PF) 0.5 MG/ML IJ SOLN
INTRAMUSCULAR | Status: DC | PRN
  Administered 2020-05-09: 150 ug via INTRATHECAL

## 2020-05-09 MED ORDER — ACETAMINOPHEN 500 MG PO TABS
ORAL_TABLET | ORAL | Status: AC
  Filled 2020-05-09: qty 2

## 2020-05-09 MED ORDER — NALOXONE HCL 0.4 MG/ML IJ SOLN: 0.4000 mg | INTRAMUSCULAR | Status: DC | PRN

## 2020-05-09 MED ORDER — NALBUPHINE HCL 10 MG/ML IJ SOLN
INTRAMUSCULAR | Status: AC
  Filled 2020-05-09: qty 1

## 2020-05-09 MED ORDER — DIPHENHYDRAMINE HCL 50 MG/ML IJ SOLN: 12.5000 mg | INTRAMUSCULAR | Status: DC | PRN

## 2020-05-09 MED ORDER — FENTANYL CITRATE (PF) 100 MCG/2ML IJ SOLN
INTRAMUSCULAR | Status: AC
  Filled 2020-05-09: qty 2

## 2020-05-09 MED ORDER — PHENYLEPHRINE HCL-NACL 20-0.9 MG/250ML-% IV SOLN
INTRAVENOUS | Status: AC
  Filled 2020-05-09: qty 250

## 2020-05-09 MED ORDER — PRENATAL MULTIVITAMIN CH
1.0000 | ORAL_TABLET | Freq: Every day | ORAL | Status: DC
  Administered 2020-05-10 – 2020-05-11 (×2): 1 via ORAL
  Filled 2020-05-09 (×2): qty 1

## 2020-05-09 MED ORDER — FENTANYL CITRATE (PF) 100 MCG/2ML IJ SOLN: 25.0000 ug | INTRAMUSCULAR | Status: DC | PRN

## 2020-05-09 MED ORDER — KETOROLAC TROMETHAMINE 30 MG/ML IJ SOLN
30.0000 mg | Freq: Four times a day (QID) | INTRAMUSCULAR | Status: AC
  Administered 2020-05-09 – 2020-05-10 (×3): 30 mg via INTRAVENOUS
  Filled 2020-05-09 (×4): qty 1

## 2020-05-09 MED ORDER — NALOXONE HCL 4 MG/10ML IJ SOLN
1.0000 ug/kg/h | INTRAVENOUS | Status: DC | PRN
  Filled 2020-05-09: qty 5

## 2020-05-09 MED ORDER — TETANUS-DIPHTH-ACELL PERTUSSIS 5-2.5-18.5 LF-MCG/0.5 IM SUSY: 0.5000 mL | PREFILLED_SYRINGE | Freq: Once | INTRAMUSCULAR | Status: DC

## 2020-05-09 MED ORDER — DIPHENHYDRAMINE HCL 25 MG PO CAPS: 25.0000 mg | ORAL_CAPSULE | Freq: Four times a day (QID) | ORAL | Status: DC | PRN

## 2020-05-09 MED ORDER — BUPIVACAINE IN DEXTROSE 0.75-8.25 % IT SOLN
INTRATHECAL | Status: DC | PRN
  Administered 2020-05-09: 2 mL via INTRATHECAL

## 2020-05-09 MED ORDER — SENNOSIDES-DOCUSATE SODIUM 8.6-50 MG PO TABS
2.0000 | ORAL_TABLET | ORAL | Status: DC
  Administered 2020-05-10: 17:00:00 2 via ORAL
  Filled 2020-05-09: qty 2

## 2020-05-09 MED ORDER — POVIDONE-IODINE 10 % EX SWAB
2.0000 "application " | Freq: Once | CUTANEOUS | Status: AC
  Administered 2020-05-09: 2 via TOPICAL

## 2020-05-09 MED ORDER — ENOXAPARIN SODIUM 100 MG/ML ~~LOC~~ SOLN
90.0000 mg | SUBCUTANEOUS | Status: DC
  Administered 2020-05-10 – 2020-05-11 (×2): 90 mg via SUBCUTANEOUS
  Filled 2020-05-09 (×3): qty 0.9

## 2020-05-09 MED ORDER — DEXAMETHASONE SODIUM PHOSPHATE 10 MG/ML IJ SOLN
INTRAMUSCULAR | Status: AC
  Filled 2020-05-09: qty 1

## 2020-05-09 MED ORDER — DEXTROSE 5 % IV SOLN
3.0000 g | INTRAVENOUS | Status: AC
  Administered 2020-05-09: 12:00:00 3 g via INTRAVENOUS

## 2020-05-09 MED ORDER — OXYCODONE HCL 5 MG PO TABS
5.0000 mg | ORAL_TABLET | ORAL | Status: DC | PRN
Start: 2020-05-09 — End: 2020-05-11
  Administered 2020-05-10 – 2020-05-11 (×7): 10 mg via ORAL
  Filled 2020-05-09 (×7): qty 2

## 2020-05-09 MED ORDER — SOD CITRATE-CITRIC ACID 500-334 MG/5ML PO SOLN
30.0000 mL | ORAL | Status: AC
  Administered 2020-05-09: 12:00:00 30 mL via ORAL

## 2020-05-09 MED ORDER — SIMETHICONE 80 MG PO CHEW
80.0000 mg | CHEWABLE_TABLET | Freq: Three times a day (TID) | ORAL | Status: DC
  Administered 2020-05-09 – 2020-05-11 (×6): 80 mg via ORAL
  Filled 2020-05-09 (×6): qty 1

## 2020-05-09 MED ORDER — ONDANSETRON HCL 4 MG/2ML IJ SOLN
INTRAMUSCULAR | Status: DC | PRN
  Administered 2020-05-09: 4 mg via INTRAVENOUS

## 2020-05-09 MED ORDER — SCOPOLAMINE 1 MG/3DAYS TD PT72
1.0000 | MEDICATED_PATCH | Freq: Once | TRANSDERMAL | Status: DC
  Administered 2020-05-09: 12:00:00 1.5 mg via TRANSDERMAL

## 2020-05-09 MED ORDER — FENTANYL CITRATE (PF) 100 MCG/2ML IJ SOLN
INTRAMUSCULAR | Status: DC | PRN
  Administered 2020-05-09: 15 ug via INTRATHECAL

## 2020-05-09 MED ORDER — OXYTOCIN-SODIUM CHLORIDE 30-0.9 UT/500ML-% IV SOLN
INTRAVENOUS | Status: AC
  Filled 2020-05-09: qty 500

## 2020-05-09 MED ORDER — ONDANSETRON HCL 4 MG/2ML IJ SOLN
INTRAMUSCULAR | Status: AC
  Filled 2020-05-09: qty 2

## 2020-05-09 MED ORDER — PHENYLEPHRINE HCL-NACL 20-0.9 MG/250ML-% IV SOLN
INTRAVENOUS | Status: DC | PRN
  Administered 2020-05-09: 60 ug/min via INTRAVENOUS

## 2020-05-09 MED ORDER — NALBUPHINE HCL 10 MG/ML IJ SOLN
5.0000 mg | Freq: Once | INTRAMUSCULAR | Status: AC | PRN
  Administered 2020-05-09: 5 mg via SUBCUTANEOUS

## 2020-05-09 MED ORDER — SODIUM CHLORIDE 0.9% FLUSH: 3.0000 mL | INTRAVENOUS | Status: DC | PRN

## 2020-05-09 MED ORDER — KETOROLAC TROMETHAMINE 30 MG/ML IJ SOLN
INTRAMUSCULAR | Status: AC
  Filled 2020-05-09: qty 1

## 2020-05-09 MED ORDER — MEASLES, MUMPS & RUBELLA VAC IJ SOLR: 0.5000 mL | Freq: Once | INTRAMUSCULAR | Status: DC

## 2020-05-09 MED ORDER — ONDANSETRON HCL 4 MG/2ML IJ SOLN: 4.0000 mg | Freq: Three times a day (TID) | INTRAMUSCULAR | Status: DC | PRN

## 2020-05-09 SURGICAL SUPPLY — 41 items
CANISTER PREVENA PLUS 150 (CANNISTER) ×3 IMPLANT
CHLORAPREP W/TINT 26ML (MISCELLANEOUS) ×3 IMPLANT
CLAMP CORD UMBIL (MISCELLANEOUS) IMPLANT
CLOTH BEACON ORANGE TIMEOUT ST (SAFETY) ×3 IMPLANT
DERMABOND ADVANCED (GAUZE/BANDAGES/DRESSINGS) ×4
DERMABOND ADVANCED .7 DNX12 (GAUZE/BANDAGES/DRESSINGS) ×2 IMPLANT
DRESSING PREVENA PLUS CUSTOM (GAUZE/BANDAGES/DRESSINGS) ×1 IMPLANT
DRSG OPSITE POSTOP 4X10 (GAUZE/BANDAGES/DRESSINGS) ×3 IMPLANT
DRSG PREVENA PLUS CUSTOM (GAUZE/BANDAGES/DRESSINGS) ×3
ELECT REM PT RETURN 9FT ADLT (ELECTROSURGICAL) ×3
ELECTRODE REM PT RTRN 9FT ADLT (ELECTROSURGICAL) ×1 IMPLANT
EXTRACTOR VACUUM BELL STYLE (SUCTIONS) IMPLANT
GLOVE BIOGEL PI IND STRL 7.0 (GLOVE) ×1 IMPLANT
GLOVE BIOGEL PI IND STRL 8 (GLOVE) ×1 IMPLANT
GLOVE BIOGEL PI INDICATOR 7.0 (GLOVE) ×2
GLOVE BIOGEL PI INDICATOR 8 (GLOVE) ×2
GLOVE ECLIPSE 8.0 STRL XLNG CF (GLOVE) ×3 IMPLANT
GOWN STRL REUS W/TWL LRG LVL3 (GOWN DISPOSABLE) ×6 IMPLANT
KIT ABG SYR 3ML LUER SLIP (SYRINGE) ×3 IMPLANT
MAT PREVALON FULL STRYKER (MISCELLANEOUS) ×3 IMPLANT
NEEDLE HYPO 18GX1.5 BLUNT FILL (NEEDLE) ×3 IMPLANT
NEEDLE HYPO 22GX1.5 SAFETY (NEEDLE) ×3 IMPLANT
NEEDLE HYPO 25X5/8 SAFETYGLIDE (NEEDLE) ×3 IMPLANT
NS IRRIG 1000ML POUR BTL (IV SOLUTION) ×3 IMPLANT
PACK C SECTION WH (CUSTOM PROCEDURE TRAY) ×3 IMPLANT
PAD OB MATERNITY 4.3X12.25 (PERSONAL CARE ITEMS) ×3 IMPLANT
PENCIL SMOKE EVAC W/HOLSTER (ELECTROSURGICAL) ×3 IMPLANT
RTRCTR C-SECT PINK 25CM LRG (MISCELLANEOUS) IMPLANT
SUT CHROMIC 0 CT 1 (SUTURE) ×3 IMPLANT
SUT MNCRL 0 VIOLET CTX 36 (SUTURE) ×2 IMPLANT
SUT MONOCRYL 0 CTX 36 (SUTURE) ×4
SUT PLAIN 2 0 (SUTURE)
SUT PLAIN 2 0 XLH (SUTURE) IMPLANT
SUT PLAIN ABS 2-0 CT1 27XMFL (SUTURE) IMPLANT
SUT VIC AB 0 CTX 36 (SUTURE) ×2
SUT VIC AB 0 CTX36XBRD ANBCTRL (SUTURE) ×1 IMPLANT
SUT VIC AB 4-0 KS 27 (SUTURE) IMPLANT
SYR 20CC LL (SYRINGE) ×6 IMPLANT
TOWEL OR 17X24 6PK STRL BLUE (TOWEL DISPOSABLE) ×3 IMPLANT
TRAY FOLEY W/BAG SLVR 14FR LF (SET/KITS/TRAYS/PACK) IMPLANT
WATER STERILE IRR 1000ML POUR (IV SOLUTION) ×3 IMPLANT

## 2020-05-09 NOTE — Discharge Instructions (Signed)

## 2020-05-09 NOTE — Transfer of Care (Signed)
Immediate Anesthesia Transfer of Care Note  Patient: Terri Tran  Procedure(s) Performed: CESAREAN SECTION (N/A )  Patient Location: PACU  Anesthesia Type:Spinal  Level of Consciousness: awake, alert , oriented and patient cooperative  Airway & Oxygen Therapy: Patient Spontanous Breathing  Post-op Assessment: Report given to RN and Post -op Vital signs reviewed and stable  Post vital signs: Reviewed and stable  Last Vitals:  Vitals Value Taken Time  BP    Temp    Pulse    Resp 8 05/09/20 1359  SpO2    Vitals shown include unvalidated device data.  Last Pain:  Vitals:   05/09/20 1104  TempSrc: Oral  PainSc: 1          Complications: No complications documented.

## 2020-05-09 NOTE — Anesthesia Procedure Notes (Addendum)
Spinal  Patient location during procedure: OR Start time: 05/09/2020 11:48 AM End time: 05/09/2020 12:08 PM Staffing Performed: anesthesiologist  Anesthesiologist: Heather Roberts, MD Preanesthetic Checklist Completed: patient identified, IV checked, risks and benefits discussed, surgical consent, monitors and equipment checked, pre-op evaluation and timeout performed Spinal Block Patient position: sitting Prep: DuraPrep Patient monitoring: cardiac monitor, continuous pulse ox and blood pressure Approach: midline Location: L2-3 Injection technique: single-shot Needle Needle type: Pencan  Needle gauge: 24 G Needle length: 9 cm Additional Notes Functioning IV was confirmed and monitors were applied. Sterile prep and drape, including hand hygiene and sterile gloves were used. The patient was positioned and the spine was prepped. The skin was anesthetized with lidocaine.  Free flow of clear CSF was obtained prior to injecting local anesthetic into the CSF.  The spinal needle aspirated freely following injection.  The needle was carefully withdrawn.  The patient tolerated the procedure well.

## 2020-05-09 NOTE — H&P (Signed)
LABOR AND DELIVERY ADMISSION HISTORY AND PHYSICAL NOTE  Terri Tran is a 29 y.o. female [phone] with IUP at [redacted]w[redacted]d presenting for scheduled repeat cesarean x4 and BTL.   She reports positive fetal movement. She denies leakage of fluid or vaginal bleeding.   She plans on bottle feeding. She requests BTL for birth control.  Prenatal History/Complications: PNC at [facility]:  @[redacted]w[redacted]d , CWD, normal anatomy, cehalic presentation, posterior placenta, 52%ile, EFW 3195g  Pregnancy complications:  - none  Past Medical History: Past Medical History:  Diagnosis Date  . Abnormal genetic test 10/01/2018   SMA carrier Rec Fob get tested  . Anemia affecting first pregnancy 2013   was taking Iron supplements  . Anxiety    Phreesia 10/12/2019  . Depression 2013   postpartum depression after first delivery; was prescribed Xanax  . GBS bacteriuria 09/13/2018  . GERD (gastroesophageal reflux disease)    during pregnancy only; takes Tums; helps  . Hypertension   . Pregnancy induced hypertension     Past Surgical History: Past Surgical History:  Procedure Laterality Date  . CESAREAN SECTION    . CESAREAN SECTION N/A 09/02/2016   Procedure: CESAREAN SECTION;  Surgeon: 09/04/2016, MD;  Location: [facility] BIRTHING SUITES;  Service: Obstetrics;  Laterality: N/A;  . CESAREAN SECTION N/A 02/02/2019   Procedure: CESAREAN SECTION;  Surgeon: 02/04/2019, DO;  Location: [facility] LD ORS;  Service: Obstetrics;  Laterality: N/A;  . CESAREAN SECTION N/A    Phreesia 10/12/2019    Obstetrical History: OB History    Gravida  4   Para  3   Term  3   Preterm  0   AB  0   Living  3     SAB  0   IAB  0   Ectopic  0   Multiple  0   Live Births  3           Social History: Social History   Socioeconomic History  . Marital status: Single    Spouse name: Not on file  . Number of children: Not on file  . Years of education: Not on file  . Highest education level: Not on file   Occupational History  . Not on file  Tobacco Use  . Smoking status: Former Smoker    Packs/day: 0.50    Types: Cigarettes    Quit date: 07/21/2018    Years since quitting: 1.8  . Smokeless tobacco: Never Used  Vaping Use  . Vaping Use: Never used  Substance and Sexual Activity  . Alcohol use: No    Alcohol/week: 7.0 standard drinks    Types: 7 Cans of beer per week  . Drug use: No  . Sexual activity: Yes    Birth control/protection: None  Other Topics Concern  . Not on file  Social History Narrative  . Not on file   Social Determinants of Health   Financial Resource Strain: Not on file  Food Insecurity: No Food Insecurity  . Worried About 09/20/2018 in the Last Year: Never true  . Ran Out of Food in the Last Year: Never true  Transportation Needs: No Transportation Needs  . Lack of Transportation (Medical): No  . Lack of Transportation (Non-Medical): No  Physical Activity: Not on file  Stress: Not on file  Social Connections: Not on file    Family History: Family History  Problem Relation Age of Onset  . Hypertension Mother  Allergies: No Known Allergies  Medications Prior to Admission  Medication Sig Dispense Refill Last Dose  . Aspirin 81 MG CAPS Take by mouth.     . prenatal vitamin w/FE, FA (PRENATAL 1 + 1) 27-1 MG TABS tablet Take 1 tablet by mouth daily at 12 noon. 30 tablet 6   . sertraline (ZOLOFT) 25 MG tablet Take 2 tablets (50 mg total) by mouth daily. 60 tablet 1      Review of Systems  All systems reviewed and negative except as stated in HPI  Physical Exam Blood pressure 126/90, temperature 98.8 F (37.1 C), temperature source Oral, resp. rate 20, height 5\' 8"  (1.727 m), weight (!) 172 kg, last menstrual period 10/24/2019, not currently breastfeeding. General appearance: alert, oriented, NAD Lungs: normal respiratory effort Heart: regular rate Abdomen: soft, non-tender; gravid Extremities: No calf swelling or tenderness FHR:  132  Prenatal labs: ABO, Rh: --/--/A POS (12/17 1126) Antibody: NEG (12/17 1126) Rubella: 1.00 (10/20 1634) RPR: NON REACTIVE (12/17 1200)  HBsAg: Negative (10/20 1634)  HIV: Non Reactive (10/20 1634)  GC/Chlamydia: neg/neg  GBS: Negative/-- (12/10 1133)  2-hr GTT: normal Genetic screening:  Not done Anatomy 08-31-1994: normal  Prenatal Transfer Tool  Maternal Diabetes: No Genetic Screening: Declined Maternal Ultrasounds/Referrals: Normal Fetal Ultrasounds or other Referrals:  None Maternal Substance Abuse:  No Significant Maternal Medications:  None Significant Maternal Lab Results: Group B Strep negative  No results found for this or any previous visit (from the past 24 hour(s)).  Patient Active Problem List   Diagnosis Date Noted  . UTI in pregnancy 03/15/2020  . History of cesarean delivery 03/15/2020  . Supervision of high risk pregnancy, antepartum 03/09/2020  . Depression 10/14/2019  . Generalized anxiety disorder 10/14/2019  . History of GBS bacteriuria 09/13/2018  . Obesity in pregnancy 08/28/2016  . BMI 50.0-59.9, adult (HCC) 08/28/2016    Assessment: Terri Tran is a 29 y.o. [phone] at [redacted]w[redacted]d here for scheduled CS.  #RCS:  #Unwatned fertility: The risks of cesarean section were discussed with the patient including but were not limited to: bleeding which may require transfusion or reoperation; infection which may require antibiotics; injury to bowel, bladder, ureters or other surrounding organs; injury to the fetus; need for additional procedures including hysterectomy in the event of a life-threatening hemorrhage; placental abnormalities wth subsequent pregnancies, incisional problems, thromboembolic phenomenon and other postoperative/anesthesia complications.  Patient also desires permanent sterilization.  Other reversible forms of contraception were discussed with patient; she declines all other modalities. Risks of procedure discussed with patient including but not  limited to: risk of regret, permanence of method, bleeding, infection, injury to surrounding organs and need for additional procedures.  Failure risk of about 1% with increased risk of ectopic gestation if pregnancy occurs was also discussed with patient.  Also discussed possibility of post-tubal pain syndrome. The patient concurred with the proposed plan, giving informed written consent for the procedures.  Patient has been NPO since last night she will remain NPO for procedure. Anesthesia and OR aware.  Preoperative prophylactic antibiotics and SCDs ordered on call to the OR.    #Anesthesia: Spinal #FWB: FHR 132 #GBS/ID: neg #COVID: swab neg on 05/06/2020 #MOF: bottle #MOC: BTL, salpingectomy if feasible #Circ: yes  05/08/2020 05/09/2020, 11:28 AM

## 2020-05-09 NOTE — Op Note (Signed)
Terri Tran PROCEDURE DATE: 05/09/2020  PREOPERATIVE DIAGNOSES: Intrauterine pregnancy at [redacted]w[redacted]d weeks gestation; elective repeat cesarean section, history of cesarean section x3, desire for permanent sterilization  POSTOPERATIVE DIAGNOSES: The same  PROCEDURE: Repeat Low Transverse Cesarean Section  SURGEON:  Dr. Crissie Reese  ASSISTANT:  Dr. Germaine Pomfret  ANESTHESIOLOGY TEAM: Anesthesiologist: Heather Roberts, MD CRNA: Shanon Payor, CRNA  INDICATIONS: Terri Tran is a 29 y.o. [phone] at [redacted]w[redacted]d here for cesarean section secondary to the indications listed under preoperative diagnoses; please see preoperative note for further details.  The risks of cesarean section were discussed with the patient including but were not limited to: bleeding which may require transfusion or reoperation; infection which may require antibiotics; injury to bowel, bladder, ureters or other surrounding organs; injury to the fetus; need for additional procedures including hysterectomy in the event of a life-threatening hemorrhage; placental abnormalities wth subsequent pregnancies, incisional problems, thromboembolic phenomenon and other postoperative/anesthesia complications.   The patient concurred with the proposed plan, giving informed written consent for the procedure.    FINDINGS:  Viable female infant in cephalic presentation.  Apgars 8 and 9.  Clear amniotic fluid.  Intact placenta, three vessel cord.  Normal uterus, fallopian tubes and ovaries bilaterally.  ANESTHESIA: Spinal  INTRAVENOUS FLUIDS: 2500 ml   ESTIMATED BLOOD LOSS: 227 ml URINE OUTPUT:  300 ml SPECIMENS: Placenta sent to L&D, bilateral fallopian tubes with fimbriae sent to pathology COMPLICATIONS: None immediate  PROCEDURE IN DETAIL:  The patient preoperatively received intravenous antibiotics and had sequential compression devices applied to her lower extremities.  She was then taken to the operating room where spinal anesthesia was  administered and was found to be adequate. She was then placed in a dorsal supine position with a leftward tilt, and prepped and draped in a sterile manner.  A foley catheter was placed into her bladder and attached to constant gravity.  After an adequate timeout was performed, a Pfannenstiel skin incision was made with scalpel and carried through to the underlying layer of fascia. The fascia was incised in the midline, and this incision was extended bilaterally using the Mayo scissors.  Kocher clamps were applied to the superior aspect of the fascial incision and the underlying rectus muscles were dissected off bluntly. The rectus muscles were separated in the midline and the peritoneum was entered bluntly. The Alexis self-retaining retractor was introduced into the abdominal cavity.  Attention was turned to the lower uterine segment where a low transverse hysterotomy was made with a scalpel and extended bilaterally bluntly.    Due to difficulty evacuating the fetal head, the soft vacuum cup was positioned over the sagittal suture 3 cm anterior to posterior fontanelle.  Pressure was then increased to 500 mmHg and pulling was administered, with 1 pull and 0 popoffs.  The infant was then delivered atraumatically.  The infant was successfully delivered, the cord was clamped and cut after one minute, and the infant was handed over to the awaiting neonatology team. Uterine massage was then administered, and the placenta delivered intact with a three-vessel cord. The uterus was then cleared of clots and debris.  The hysterotomy was closed with 0 Monocryl in a running locked fashion, and an imbricating layer was also placed with 0 Monocryl.  Attention was then turned to the left fallopian tube. Kelly forceps were placed on the mesosalpinx underneath most of the tube.  This pedicle was double suture ligated with 2-0 Monocryl, and the tube including the fimbriated end was excised.  The  right fallopian tube was then  identified, doubly ligated, and was excised in a similar fashion allowing for bilateral tubal sterilization via bilateral salpingectomy.  Good hemostasis was noted overall.  The pelvis was cleared of all clot and debris. Hemostasis was confirmed on all surfaces.  The retractor was removed.  The peritoneum was closed with a 2-0 chromic gut in a running stitch. The fascia was then closed using 0 Vicryl in a running fashion.  The subcutaneous layer was irrigated, reapproximated with 2-0 plain gut interrupted stitches, and the skin was closed with a 4-0 Vicryl subcuticular stitch. The patient tolerated the procedure well. Sponge, instrument and needle counts were correct x 3.  She was taken to the recovery room in stable condition.   Alric Seton, MD OB Fellow, Faculty [facility] 05/09/2020 2:52 PM

## 2020-05-09 NOTE — Anesthesia Preprocedure Evaluation (Addendum)
Anesthesia Evaluation  Patient identified by MRN, date of birth, ID band Patient awake    Reviewed: Allergy & Precautions, H&P , NPO status , Patient's Chart, lab work & pertinent test results  History of Anesthesia Complications Negative for: history of anesthetic complications  Airway Mallampati: III  TM Distance: >3 FB Neck ROM: Full    Dental no notable dental hx. (+) Dental Advisory Given   Pulmonary neg pulmonary ROS, Patient abstained from smoking., former smoker,    Pulmonary exam normal        Cardiovascular hypertension, On Medications Normal cardiovascular exam     Neuro/Psych Depression negative neurological ROS     GI/Hepatic negative GI ROS, Neg liver ROS, GERD  Medicated,  Endo/Other  Morbid obesity  Renal/GU   negative genitourinary   Musculoskeletal negative musculoskeletal ROS (+)   Abdominal (+) + obese,   Peds  Hematology negative hematology ROS (+) anemia ,   Anesthesia Other Findings   Reproductive/Obstetrics (+) Pregnancy                            Anesthesia Physical  Anesthesia Plan  ASA: III  Anesthesia Plan: Spinal   Post-op Pain Management:    Induction:   PONV Risk Score and Plan: 4 or greater and Ondansetron, Dexamethasone, Scopolamine patch - Pre-op and Treatment may vary due to age or medical condition  Airway Management Planned:   Additional Equipment:   Intra-op Plan:   Post-operative Plan:   Informed Consent: I have reviewed the patients History and Physical, chart, labs and discussed the procedure including the risks, benefits and alternatives for the proposed anesthesia with the patient or authorized representative who has indicated his/her understanding and acceptance.     Dental advisory given  Plan Discussed with: CRNA and Anesthesiologist  Anesthesia Plan Comments:        Anesthesia Quick Evaluation

## 2020-05-09 NOTE — Anesthesia Postprocedure Evaluation (Signed)
Anesthesia Post Note  Patient: Terri Tran  Procedure(s) Performed: CESAREAN SECTION (N/A )     Patient location during evaluation: PACU Anesthesia Type: Spinal Level of consciousness: awake and alert Pain management: pain level controlled Vital Signs Assessment: post-procedure vital signs reviewed and stable Respiratory status: spontaneous breathing and respiratory function stable Cardiovascular status: blood pressure returned to baseline and stable Postop Assessment: spinal receding Anesthetic complications: no   No complications documented.  Last Vitals:  Vitals:   05/09/20 1500 05/09/20 1519  BP: 99/63 115/75  Pulse: 66 61  Resp: 19 16  Temp: 37 C   SpO2: 100% 100%    Last Pain:  Vitals:   05/09/20 1507  TempSrc:   PainSc: 2    Pain Goal: Patients Stated Pain Goal: 3 (05/09/20 1507)  LLE Motor Response: Purposeful movement (05/09/20 1459) LLE Sensation: Tingling (05/09/20 1459) RLE Motor Response: Purposeful movement (05/09/20 1459) RLE Sensation: Tingling (05/09/20 1459)     Epidural/Spinal Function Cutaneous sensation: Tingles (05/09/20 1430), Patient able to flex knees: Yes (05/09/20 1459), Patient able to lift hips off bed: Yes (05/09/20 1459), Back pain beyond tenderness at insertion site: No (05/09/20 1459), Progressively worsening motor and/or sensory loss: No (05/09/20 1459), Bowel and/or bladder incontinence post epidural: No (05/09/20 1459)  Tito Ausmus DANIEL

## 2020-05-09 NOTE — Discharge Summary (Signed)
Postpartum Discharge Summary    Patient Name: Terri Tran DOB: 1990/09/28 MRN: [id number]  Date of admission: 05/09/2020 Delivery date:05/09/2020  Delivering provider: Clarnce Flock  Date of discharge: 05/11/2020  Admitting diagnosis: History of cesarean section [Z98.891] Intrauterine pregnancy: [redacted]w[redacted]d    Secondary diagnosis:  Active Problems:   Obesity in pregnancy   BMI 50.0-59.9, adult (HLadue   Gestational hypertension   Depression   Generalized anxiety disorder   Supervision of high risk pregnancy, antepartum   UTI in pregnancy   History of cesarean delivery   History of cesarean section   Cesarean delivery delivered   History of bilateral tubal ligation  Additional problems: none    Discharge diagnosis: Term Pregnancy Delivered                                              Post partum procedures:postpartum tubal ligation Augmentation: N/A Complications: None  Hospital course: Sceduled C/S   29y.o. yo [id number] admitted to the hospital 05/09/2020 for scheduled cesarean section with the following indication:Elective Repeat.Delivery details are as follows:  Membrane Rupture Time/Date: 12:37 PM ,05/09/2020   Delivery Method:C-Section, Low Transverse  Details of operation can be found in separate operative note.  Patient had an uncomplicated postpartum course. BTL was performed at time of Cesarean. She is ambulating, tolerating a regular diet, passing flatus, and urinating well. Patient is discharged home in stable condition on  05/11/20. Pt discharged home on procardia 3070maily with plan for 1 week BP check in clinic.        Newborn Data: Birth date:05/09/2020  Birth time:12:39 PM  Gender:Female  Living status:Living  Apgars:8 ,9  Weight:3045 g     Magnesium Sulfate received: No BMZ received: No Rhophylac:N/A MMR:N/A  T-DaP:Given prenatally Flu: No Transfusion:No  Physical exam  Vitals:   05/10/20 2229 05/11/20 0545 05/11/20 1200 05/11/20  1213  BP:  (!) 142/83 (!) 144/81 139/72  Pulse: 62 61    Resp: 18 18    Temp: 98.7 F (37.1 C) 98.6 F (37 C)    TempSrc: Oral Oral    SpO2:  100%    Weight:      Height:       General: alert, cooperative and no distress Lochia: appropriate Uterine Fundus: firm Incision: Healing well with no significant drainage, No significant erythema, Dressing is clean, dry, and intact DVT Evaluation: No evidence of DVT seen on physical exam. No cords or calf tenderness. No significant calf/ankle edema. Labs: Lab Results  Component Value Date   WBC 16.4 (H) 05/10/2020   HGB 11.0 (L) 05/10/2020   HCT 32.3 (L) 05/10/2020   MCV 85.7 05/10/2020   PLT 333 05/10/2020   CMP Latest Ref Rng & Units 05/11/2020  Glucose 70 - 99 mg/dL 73  BUN 6 - 20 mg/dL 8  Creatinine 0.44 - 1.00 mg/dL 0.61  Sodium 135 - 145 mmol/L 136  Potassium 3.5 - 5.1 mmol/L 3.9  Chloride 98 - 111 mmol/L 106  CO2 22 - 32 mmol/L 21(L)  Calcium 8.9 - 10.3 mg/dL 8.5(L)  Total Protein 6.5 - 8.1 g/dL 5.4(L)  Total Bilirubin 0.3 - 1.2 mg/dL 0.4  Alkaline Phos 38 - 126 U/L 48  AST 15 - 41 U/L 17  ALT 0 - 44 U/L 16   Edinburgh Score: Edinburgh Postnatal Depression Scale  Screening Tool 05/09/2020  I have been able to laugh and see the funny side of things. 0  I have looked forward with enjoyment to things. 2  I have blamed myself unnecessarily when things went wrong. 0  I have been anxious or worried for no good reason. 3  I have felt scared or panicky for no good reason. 1  Things have been getting on top of me. 2  I have been so unhappy that I have had difficulty sleeping. 0  I have felt sad or miserable. 0  I have been so unhappy that I have been crying. 0  The thought of harming myself has occurred to me. 0  Edinburgh Postnatal Depression Scale Total 8     After visit meds:  Allergies as of 05/11/2020   No Known Allergies     Medication List    STOP taking these medications   Aspirin 81 MG Caps    sertraline 25 MG tablet Commonly known as: Zoloft     TAKE these medications   acetaminophen 325 MG tablet Commonly known as: TYLENOL Take 2 tablets (650 mg total) by mouth every 6 (six) hours as needed for mild pain, moderate pain, fever or headache (temperature > 101.5.).   coconut oil Oil Apply 1 application topically as needed (nipple pain).   ibuprofen 800 MG tablet Commonly known as: ADVIL Take 1 tablet (800 mg total) by mouth every 8 (eight) hours as needed for moderate pain or cramping.   NIFEdipine 30 MG 24 hr tablet Commonly known as: PROCARDIA-XL/NIFEDICAL-XL Take 1 tablet (30 mg total) by mouth daily. Start taking on: May 12, 2020   oxyCODONE 5 MG immediate release tablet Commonly known as: Oxy IR/ROXICODONE Take 1 tablet (5 mg total) by mouth every 4 (four) hours as needed for moderate pain.   prenatal vitamin w/FE, FA 27-1 MG Tabs tablet Take 1 tablet by mouth daily at 12 noon.            Discharge Care Instructions  (From admission, onward)         Start     Ordered   05/11/20 0000  Leave dressing on - Keep it clean, dry, and intact until clinic visit        05/11/20 1350          Discharge home in stable condition Infant Feeding: Bottle Infant Disposition:home with mother Discharge instruction: per After Visit Summary and Postpartum booklet. Activity: Advance as tolerated. Pelvic rest for 6 weeks.  Diet: routine diet Future Appointments:No future appointments. Follow up Visit: Message sent to [facility] by Sylvester Harder 05/09/20.  Please schedule this patient for a In person postpartum visit in 4 weeks with the following provider: Any provider. Additional Postpartum F/U:Postpartum Depression checkup and Incision check 1 week, BP check Low risk pregnancy complicated by: n/a Delivery mode:  C-Section, Low Transverse  Anticipated Birth Control:  BTL done PP  Terri Tran, Terri Cranker, MD OB Fellow, Faculty Practice 05/11/2020 9:21 PM

## 2020-05-10 DIAGNOSIS — Z98891 History of uterine scar from previous surgery: Secondary | ICD-10-CM

## 2020-05-10 LAB — CBC
HCT: 32.3 % — ABNORMAL LOW (ref 36.0–46.0)
Hemoglobin: 11 g/dL — ABNORMAL LOW (ref 12.0–15.0)
MCH: 29.2 pg (ref 26.0–34.0)
MCHC: 34.1 g/dL (ref 30.0–36.0)
MCV: 85.7 fL (ref 80.0–100.0)
Platelets: 333 10*3/uL (ref 150–400)
RBC: 3.77 MIL/uL — ABNORMAL LOW (ref 3.87–5.11)
RDW: 14.4 % (ref 11.5–15.5)
WBC: 16.4 10*3/uL — ABNORMAL HIGH (ref 4.0–10.5)
nRBC: 0 % (ref 0.0–0.2)

## 2020-05-10 LAB — SURGICAL PATHOLOGY

## 2020-05-10 NOTE — Clinical Social Work Maternal (Signed)
CLINICAL SOCIAL WORK MATERNAL/CHILD NOTE  Patient Details  Name: Terri Tran MRN: [id number] Date of Birth: 06-10-1990  Date:  05/10/2020  Clinical Social Worker Initiating Note:  Hortencia Pilar, LCSW Date/Time: Initiated:  05/10/20/0900     Child's Name:  Corie Chiquito "[facility]"   Biological Parents:  Mother,Father Charlena Cross. Corie Chiquito)   Need for Interpreter:  None   Reason for Referral:  Late or No Prenatal Care    Address:  [address] [place] [address]    Phone number:  [phone] (home)     Additional phone number: none  Household Members/Support Persons (HM/SP):   Household Member/Support Person 1,Household Member/Support Person 2   HM/SP Name Relationship DOB or Age  HM/SP -1  Amoria Mclees MOB 03-19-89  HM/SP -2 Corie Chiquito FOB Unknown   HM/SP -3 Eugenie Norrie daughter 77 years old  HM/SP -4 Ok Anis Spinks son 46 years old  HM/SP -5 Ari'Yona Richardson Dopp daughter 21 years old  HM/SP -6        HM/SP -7        HM/SP -8          Natural Supports (not living in the home):  Parent   Professional Supports: None   Employment: Student   Type of Work: MOB reports that she is in school.   Education:  Other (comment) (MOB reported that she is in school ot get her General Education.)   Homebound arranged:  n/a  Financial Resources:  Medicaid   Other Resources:  Sales executive ,[facility]   Cultural/Religious Considerations Which May Impact Care:  none reported.   Strengths:  Ability to meet basic needs ,Compliance with medical plan ,Home prepared for child ,Pediatrician chosen   Psychotropic Medications:         Pediatrician:    [place]  Pediatrician List:   [facility] ([address] [employer])  [place]    [place]      Pediatrician Fax Number:    Risk Factors/Current Problems:  Substance Use    Cognitive State:  Alert ,Able to  Concentrate ,Insightful    Mood/Affect:  Calm ,Interested ,Happy ,Relaxed ,Comfortable    CSW Assessment: CSW consulted due to MOB having LPNC starting at 30 weeks as well as having a hx of anxiety and depression. CSW spoke with MOB at bedside to address further needs.   CSW congratulated MOB on the birth of infant. CSW asked MOB about anyone entering the room while CSW would be speaking with her MOB and MOB expressed that her spouse may be entering. CSW understanding and asked MOB if it was okay for CSW to ask MOB safety questions before spouse came in, in which MOB expressed that it was. MOB denies SI, HI and DV to this CSW. CSW was then advised by MOB that if spouse does enter the room which CSW is speaking with her, that it is okay for CSW to continue to speak. CSW understanding and advised MOB of CSW's role and the reason for CSW coming to see her. MOB expressed that she started care at 30 weeks due to not knowing that she was pregnant. MOB expressed that "everything was normal". MOB expressed being on birth control as well. MOB expressed that she went to the health department due to feeling dizzy and knowing that "something wasn't right". MOB reported that she then was told  that she was pregnant and started care after that. CSW understanding and advised MOB of the hospital drug screen policy. MOB was advised that if infants UDS or CDS returns positive then CSW would need to make a CPS report. MOB expressed that she understood but also asked why infant was beign drug screened again.  CSW informed MOB that any time a mom gets care after 28 weeks, it is the policy that infant be screened for substances. MOB then expressed understanding and reported no other questions. MOB reported that she does have CPS hx from 2018. MOB reported that it was due to [facility] use as well, however MOB expressed that all cases have been closed as of this time.   CSW inquired from [facility] on who her supports are. MOB expressed  that she has support from FOB and her mom. MOB indicated that she does have a hx of anxiety and depression. MOB expressed medication use in the past but then reported that she copes with her anxiety and depression by "using marijuana". MOB expressed that she tried Zoloft however it made her feel sick. CSW asked MOB if she used THC while pregnant however CSW never given definite answer when asked, as MOB only reported "I smoked marijuana in the past and I am  going to keep smoking, that's how I cope with anxiety". 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 MOB expressed feelings of not being attached to child at that time, however MOB expressed that feelings changed. MOB denied having PPD after the birth of any of her other children. MOB reported to CSW that she has all needed items to care for infant with plans for infant to sleep in basinet once arrived home. MOB also reported that she gets [facility] and [employer]. MOB advised CSW of no other needs at this time.   CSW took time to provide MOB with PPD and SIDS education. MOB was given PPD Checklist in order to keep track of feelings as they may relate to PPD.   CSW will continue to monitor infants UDS and CDS and make CPS report if warranted. CSW did as that cotton balls be placed in infants pamper as MOB indicated that none were in infants pamper. RN agreeable to place cotton balls.   CSW Plan/Description:  No Further Intervention Required/No Barriers to Discharge,Sudden Infant Death Syndrome (SIDS) Education,Perinatal Mood and Anxiety Disorder (PMADs) Education,Hospital Drug Screen Policy Information,CSW Will Continue to Monitor Umbilical Cord Tissue Drug Screen Results and Make Report if Celso Sickle, LCSWA 05/10/2020, 10:02 AM

## 2020-05-10 NOTE — Progress Notes (Signed)
POSTPARTUM PROGRESS NOTE  Subjective: Terri Tran is a 29 y.o. [phone] s/p repeat LTCS with BTL at [redacted]w[redacted]d.  She reports she doing well. No acute events overnight. She denies any problems with ambulating or po intake.Has not yet voided since foley catheter removal. Denies nausea or vomiting. She has not passed flatus. Pain is moderately controlled.  Lochia is moderate.  Objective: Blood pressure 97/77, pulse 73, temperature 98.3 F (36.8 C), temperature source Oral, resp. rate 17, height 5\' 8"  (1.727 m), weight (!) 172 kg, last menstrual period 10/24/2019, SpO2 100 %, unknown if currently breastfeeding.  Physical Exam:  General: alert, cooperative and no distress Chest: no respiratory distress Abdomen: soft, non-tender  Uterine Fundus: firm and at level of umbilicus, honeycomb dressing c/d/i Extremities: No calf swelling or tenderness  no edema  No results for input(s): HGB, HCT in the last 72 hours.  Assessment/Plan: Terri Tran is a 29 y.o. [phone] s/p rLTCS and BTL at [redacted]w[redacted]d. Doing well overall.  Routine Postpartum Care: Doing well, pain well-controlled.  -- Continue routine care, lactation support  -- Contraception: s/p BTL -- Feeding: breast -- Female infant, consented for circ   --PreOp hgb 11.9, post Op pending.    Dispo: Plan for discharge POD 2 or 3.  [redacted]w[redacted]d, MD OB Fellow, Faculty Practice 05/10/2020 6:47 AM

## 2020-05-11 ENCOUNTER — Other Ambulatory Visit (HOSPITAL_COMMUNITY): Payer: Self-pay | Admitting: Obstetrics and Gynecology

## 2020-05-11 DIAGNOSIS — Z9851 Tubal ligation status: Secondary | ICD-10-CM

## 2020-05-11 LAB — COMPREHENSIVE METABOLIC PANEL
ALT: 16 U/L (ref 0–44)
AST: 17 U/L (ref 15–41)
Albumin: 2.4 g/dL — ABNORMAL LOW (ref 3.5–5.0)
Alkaline Phosphatase: 48 U/L (ref 38–126)
Anion gap: 9 (ref 5–15)
BUN: 8 mg/dL (ref 6–20)
CO2: 21 mmol/L — ABNORMAL LOW (ref 22–32)
Calcium: 8.5 mg/dL — ABNORMAL LOW (ref 8.9–10.3)
Chloride: 106 mmol/L (ref 98–111)
Creatinine, Ser: 0.61 mg/dL (ref 0.44–1.00)
GFR, Estimated: >60 mL/min (ref >60)
Glucose, Bld: 73 mg/dL (ref 70–99)
Potassium: 3.9 mmol/L (ref 3.5–5.1)
Sodium: 136 mmol/L (ref 135–145)
Total Bilirubin: 0.4 mg/dL (ref 0.3–1.2)
Total Protein: 5.4 g/dL — ABNORMAL LOW (ref 6.5–8.1)

## 2020-05-11 MED ORDER — COCONUT OIL OIL: 1.0000 "application " | TOPICAL_OIL | 0 refills | Status: DC | PRN

## 2020-05-11 MED ORDER — NIFEDIPINE ER OSMOTIC RELEASE 30 MG PO TB24: 30.0000 mg | ORAL_TABLET | Freq: Every day | ORAL | 0 refills | Status: DC

## 2020-05-11 MED ORDER — NIFEDIPINE ER OSMOTIC RELEASE 30 MG PO TB24
30.0000 mg | ORAL_TABLET | Freq: Every day | ORAL | Status: DC
  Administered 2020-05-11: 11:00:00 30 mg via ORAL
  Filled 2020-05-11: qty 1

## 2020-05-11 MED ORDER — IBUPROFEN 800 MG PO TABS: 800.0000 mg | ORAL_TABLET | Freq: Three times a day (TID) | ORAL | 0 refills | Status: DC | PRN

## 2020-05-11 MED ORDER — OXYCODONE HCL 5 MG PO TABS: 5.0000 mg | ORAL_TABLET | ORAL | 0 refills | Status: DC | PRN

## 2020-05-11 MED ORDER — ACETAMINOPHEN 325 MG PO TABS: 650.0000 mg | ORAL_TABLET | Freq: Four times a day (QID) | ORAL | Status: DC | PRN

## 2020-05-11 MED FILL — NIFEdipine ER 30 MG TB24: 30 | 45 days supply | Qty: 45 | Fill #0

## 2020-05-11 MED FILL — IBUPROFEN 800 MG TAB: 800 | 10 days supply | Qty: 30 | Fill #0

## 2020-05-11 NOTE — Progress Notes (Signed)
POSTPARTUM PROGRESS NOTE  Subjective: Terri Tran is a 29 y.o. [phone] s/p repeat LTCS with BTL at [redacted]w[redacted]d.  She reports she doing well. No acute events overnight. She denies any problems with ambulating or po intake.Voiding normally. Denies nausea or vomiting. She has passed flatus and had a BM. Pain is moderately controlled.  Lochia is moderate.  Objective: Blood pressure (!) 142/83, pulse 61, temperature 98.6 F (37 C), temperature source Oral, resp. rate 18, height 5\' 8"  (1.727 m), weight (!) 172 kg, last menstrual period 10/24/2019, SpO2 100 %, unknown if currently breastfeeding.  Physical Exam:  General: alert, cooperative and no distress Chest: no respiratory distress Abdomen: soft, non-tender  Uterine Fundus: firm and at level of umbilicus, honeycomb dressing c/d/i Extremities: No calf swelling or tenderness   no edema  Recent Labs    05/10/20 0525  HGB 11.0*  HCT 32.3*    Assessment/Plan: Terri Tran is a 29 y.o. 38 s/p rLTCS and BTL at [redacted]w[redacted]d. POD#2 Doing well overall.  Routine Postpartum Care: Doing well, pain well-controlled.  -- Continue routine care, lactation support  -- Contraception: s/p BTL -- Feeding: breast -- Female infant, s/p circ   --PreOp hgb 11.9, post Op 11.   --gHTN: Elevated BP >4 hours apart. No other s/s of PEC. Obtain PEC labs. Will start Procardia 30mg .    Dispo: Plan for discharge POD3.  [redacted]w[redacted]d, MD OB Fellow, Faculty Practice 05/11/2020 7:57 AM

## 2020-05-11 NOTE — Progress Notes (Signed)
Terri Tran called and updated that baby [name] will be d/c today.  Patient was started on procardia 30 mg this morning for elevated Bps over night.  Per [facility] check a few more bps throughout the day and the patient may be able to leave this evening.  Nurse is to call with any updates on blood pressures.

## 2020-05-16 ENCOUNTER — Encounter: Payer: Self-pay | Admitting: Obstetrics & Gynecology

## 2020-05-18 ENCOUNTER — Other Ambulatory Visit: Payer: Self-pay

## 2020-05-18 ENCOUNTER — Ambulatory Visit (INDEPENDENT_AMBULATORY_CARE_PROVIDER_SITE_OTHER): Payer: Medicaid Other

## 2020-05-18 VITALS — BP 136/62 | HR 79 | Wt 374.2 lb

## 2020-05-18 DIAGNOSIS — Z5189 Encounter for other specified aftercare: Secondary | ICD-10-CM | POA: Diagnosis not present

## 2020-05-18 NOTE — Progress Notes (Signed)
Pt here today for removal of Prevena wound vac, incision check, and BP check.  Pt denies headache and visual disturbances.  BP LA 136/62.  Pt reports taking Procardia 30 mg po tablet and last dose yesterday around 1700.  Removed wound vac and incision observed to be well approximated- no odor, no drainage, no edema, and no erythema.  Pt tolerated removal of wound vac well.  Pt advised to continue to allow warm, soapy water to run over incision and pat dry.  Pt also advised to continue to monitor for signs and symptoms of infection and HTN.  Pt encouraged to call the office with concerns and/or questions.  Pt verbalized understanding with no further questions.  Addison Naegeli, RN  05/18/20

## 2020-05-23 NOTE — BH Specialist Note (Signed)
Integrated Behavioral Health via Telemedicine Visit  05/23/2020 Terri Tran [id number]  Number of Integrated Behavioral Health visits: 1 Session Start time: 9:15  Session End time: 9:28 Total time: 13  Referring Provider: Donia Ast, NP Patient/Family location: [facility] Provider location: Center for [facility] at [facility] for Women  All persons participating in visit: Patient Terri Tran and [facility] [name]   Types of Service: Individual psychotherapy  I connected with [name] and/or [name]'s n/a by Video  (Video is Caregility application) and verified that I am speaking with the correct person using two identifiers.Discussed confidentiality: No   I discussed the limitations of telemedicine and the availability of in person appointments.  Discussed there is a possibility of technology failure and discussed alternative modes of communication if that failure occurs.  I discussed that engaging in this telemedicine visit, they consent to the provision of behavioral healthcare and the services will be billed under their insurance.  Patient and/or legal guardian expressed understanding and consented to Telemedicine visit: Yes   Presenting Concerns: Patient and/or family reports the following symptoms/concerns: Pt states she is sleeping and eating well, has good practical support via FOB, and feeling much less anxious than expected postpartum;entire family is bonding well with baby and looking forward to resuming online classes.  Duration of problem: Ongoing; Severity of problem: mild  Patient and/or Family's Strengths/Protective Factors: Social connections, Concrete supports in place (healthy food, safe environments, etc.) and Sense of purpose  Goals Addressed: Patient will: 1.  Maintain reduction of symptoms of: anxiety, depression and stress  2.  Demonstrate ability to: Increase healthy adjustment to current life circumstances and Increase  adequate support systems for patient/family  Progress towards Goals: Ongoing  Interventions: Interventions utilized:  Psychoeducation and/or Health Education and Link to [employer] Standardized Assessments completed: Not Needed  Patient and/or Family Response: Pt agrees to treatment plan  Assessment: Patient currently experiencing Psychosocial stress.   Patient may benefit from psychoeducation and brief therapeutic interventions regarding maintaining reduction of symptoms of anxiety, depression, stress .  Plan: 1. Follow up with behavioral health clinician on : Call [name] at [phone] as needed 2. Behavioral recommendations:  -Continue taking prenatal vitamin until postpartum visit; discuss with medical provider -Continue prioritizing healthy sleeping, eating, and self-care until at least postpartum visit -Continue with plan to go back to school to pursue personal life goals -Consider registering for and attending at least one new mom online support group, at [email or web address] or [email or web address] 3. Referral(s): Integrated Art gallery manager (In Clinic) and [employer]:  new mom support  I discussed the assessment and treatment plan with the patient and/or parent/guardian. They were provided an opportunity to ask questions and all were answered. They agreed with the plan and demonstrated an understanding of the instructions.   They were advised to call back or seek an in-person evaluation if the symptoms worsen or if the condition fails to improve as anticipated.  [name], LCSW   Depression screen [facility] 2/9 05/02/2020 03/10/2020 10/14/2019 09/14/2019 07/29/2018  Decreased Interest 1 0 1 1 1   Down, Depressed, Hopeless 0 1 1 1 1   PHQ - 2 Score 1 1 2 2 2   Altered sleeping 0 0 1 0 1  Tired, decreased energy 1 1 2 1 1   Change in appetite 0 0 3 2 0  Feeling bad or failure about yourself  0 0 1 0 0  Trouble concentrating 0 0 1 0 0  Moving  slowly or fidgety/restless 0 0 0 0 0  Suicidal thoughts 0 0 0 0 0  PHQ-9 Score 2 2 10 5 4    GAD 7 : Generalized Anxiety Score 05/02/2020 03/10/2020 10/14/2019 09/30/2019  Nervous, Anxious, on Edge 2 1 2 2   Control/stop worrying 0 0 2 1  Worry too much - different things 0 0 2 1  Trouble relaxing 0 0 2 1  Restless 0 0 1 1  Easily annoyed or irritable 2 3 1 1   Afraid - awful might happen 0 0 2 0  Total GAD 7 Score 4 4 12  7

## 2020-05-31 ENCOUNTER — Ambulatory Visit: Payer: Medicaid Other | Admitting: Clinical

## 2020-05-31 ENCOUNTER — Other Ambulatory Visit: Payer: Self-pay

## 2020-05-31 DIAGNOSIS — Z658 Other specified problems related to psychosocial circumstances: Secondary | ICD-10-CM

## 2020-05-31 NOTE — Patient Instructions (Signed)
Center for [facility] Healthcare at [facility] for Women [address] [place], [place] [address] [phone] (main office) [phone] (Ruchi Stoney's office)   New mom online support at:  [email or web address]  [email or web address]

## 2020-06-06 ENCOUNTER — Ambulatory Visit: Payer: Medicaid Other | Admitting: Family Medicine

## 2020-06-16 ENCOUNTER — Ambulatory Visit: Payer: Medicaid Other | Admitting: Obstetrics and Gynecology

## 2020-06-18 DIAGNOSIS — Z20822 Contact with and (suspected) exposure to covid-19: Secondary | ICD-10-CM | POA: Diagnosis not present

## 2020-06-24 ENCOUNTER — Ambulatory Visit
Admission: RE | Admit: 2020-06-24 | Discharge: 2020-06-24 | Disposition: A | Discharge status: Home / Self Care | Payer: Medicaid Other | Source: Ambulatory Visit | Attending: Emergency Medicine | Admitting: Emergency Medicine

## 2020-06-24 ENCOUNTER — Telehealth: Payer: Medicaid Other | Admitting: Physician Assistant

## 2020-06-24 ENCOUNTER — Other Ambulatory Visit: Payer: Self-pay

## 2020-06-24 VITALS — BP 132/84 | HR 72 | Temp 98.1°F | Resp 20

## 2020-06-24 DIAGNOSIS — R102 Pelvic and perineal pain: Secondary | ICD-10-CM | POA: Insufficient documentation

## 2020-06-24 DIAGNOSIS — R1084 Generalized abdominal pain: Secondary | ICD-10-CM | POA: Diagnosis not present

## 2020-06-24 LAB — POCT URINALYSIS DIP (MANUAL ENTRY)
Bilirubin, UA: NEGATIVE
Glucose, UA: NEGATIVE mg/dL
Ketones, POC UA: NEGATIVE mg/dL
Leukocytes, UA: NEGATIVE
Nitrite, UA: NEGATIVE
Spec Grav, UA: 1.025 (ref 1.010–1.025)
Urobilinogen, UA: 1 E.U./dL
pH, UA: 6.5 (ref 5.0–8.0)

## 2020-06-24 LAB — POCT URINE PREGNANCY: Preg Test, Ur: NEGATIVE

## 2020-06-24 MED ORDER — NAPROXEN 500 MG PO TABS: 500.0000 mg | ORAL_TABLET | Freq: Two times a day (BID) | ORAL | 0 refills | Status: DC

## 2020-06-24 NOTE — ED Provider Notes (Signed)
[facility]    CSN: [id number] Arrival date & time: 06/24/20  1350      History   Chief Complaint Chief Complaint  Patient presents with  . APPOINTMENT : UTI    HPI Terri Tran is a 30 y.o. female presenting today for evaluation of possible UTI.  Reports that she has had urinary urgency, and lower abdominal pressure/pain over the past few days.  She denies any dysuria.  Denies vaginal discharge itching or irritation.  Does report recent tubal ligation after cesarean in December.  She declines breast-feeding currently.  Denies history of stones.  Pain is constant.  Last menstrual cycle 06/06/2020.  HPI  Past Medical History:  Diagnosis Date  . Abnormal genetic test 10/01/2018   SMA carrier Rec Fob get tested  . Anemia affecting first pregnancy 2013   was taking Iron supplements  . Anxiety    Phreesia 10/12/2019  . Depression 2013   postpartum depression after first delivery; was prescribed Xanax  . GBS bacteriuria 09/13/2018  . GERD (gastroesophageal reflux disease)    during pregnancy only; takes Tums; helps  . Hypertension   . Pregnancy induced hypertension     Patient Active Problem List   Diagnosis Date Noted  . History of cesarean section 05/09/2020  . Cesarean delivery delivered 05/09/2020  . History of bilateral tubal ligation 05/09/2020  . UTI in pregnancy 03/15/2020  . History of cesarean delivery 03/15/2020  . Supervision of high risk pregnancy, antepartum 03/09/2020  . Depression 10/14/2019  . Generalized anxiety disorder 10/14/2019  . History of GBS bacteriuria 09/13/2018  . Gestational hypertension 08/31/2016  . Obesity in pregnancy 08/28/2016  . BMI 50.0-59.9, adult (HCC) 08/28/2016    Past Surgical History:  Procedure Laterality Date  . CESAREAN SECTION    . CESAREAN SECTION N/A 09/02/2016   Procedure: CESAREAN SECTION;  Surgeon: Lesly Dukes, MD;  Location: [facility] BIRTHING SUITES;  Service: Obstetrics;  Laterality: N/A;  . CESAREAN  SECTION N/A 02/02/2019   Procedure: CESAREAN SECTION;  Surgeon: Levie Heritage, DO;  Location: [facility] LD ORS;  Service: Obstetrics;  Laterality: N/A;  . CESAREAN SECTION N/A    Phreesia 10/12/2019  . CESAREAN SECTION N/A 05/09/2020   Procedure: CESAREAN SECTION;  Surgeon: Venora Maples, MD;  Location: [facility] LD ORS;  Service: Obstetrics;  Laterality: N/A;    OB History    Gravida  4   Para  4   Term  4   Preterm  0   AB  0   Living  4     SAB  0   IAB  0   Ectopic  0   Multiple  0   Live Births  4            Home Medications    Prior to Admission medications   Medication Sig Start Date End Date Taking? Authorizing Provider  naproxen (NAPROSYN) 500 MG tablet Take 1 tablet (500 mg total) by mouth 2 (two) times daily. 06/24/20  Yes Anthonio Mizzell C, PA-C  acetaminophen (TYLENOL) 325 MG tablet Take 2 tablets (650 mg total) by mouth every 6 (six) hours as needed for mild pain, moderate pain, fever or headache (temperature > 101.5.). Patient not taking: Reported on 05/18/2020 05/11/20   Sheila Oats, MD  coconut oil OIL Apply 1 application topically as needed (nipple pain). Patient not taking: Reported on 05/18/2020 05/11/20   Sheila Oats, MD  ibuprofen (ADVIL) 800 MG tablet Take 1 tablet (  800 mg total) by mouth every 8 (eight) hours as needed for moderate pain or cramping. 05/11/20   Sheila Oats, MD  NIFEdipine (PROCARDIA-XL/NIFEDICAL-XL) 30 MG 24 hr tablet Take 1 tablet (30 mg total) by mouth daily. 05/12/20   Sheila Oats, MD  oxyCODONE (OXY IR/ROXICODONE) 5 MG immediate release tablet Take 1 tablet (5 mg total) by mouth every 4 (four) hours as needed for moderate pain. 05/11/20   Sheila Oats, MD  prenatal vitamin w/FE, FA (PRENATAL 1 + 1) 27-1 MG TABS tablet Take 1 tablet by mouth daily at 12 noon. 04/21/20   Brilliant Bing, MD    Family History Family History  Problem Relation Age of Onset  . Hypertension Mother     Social History Social  History   Tobacco Use  . Smoking status: Former Smoker    Packs/day: 0.50    Types: Cigarettes    Quit date: 07/21/2018    Years since quitting: 1.9  . Smokeless tobacco: Never Used  Vaping Use  . Vaping Use: Never used  Substance Use Topics  . Alcohol use: No    Alcohol/week: 7.0 standard drinks    Types: 7 Cans of beer per week  . Drug use: No     Allergies   Patient has no known allergies.   Review of Systems Review of Systems  Constitutional: Negative for fever.  Respiratory: Negative for shortness of breath.   Cardiovascular: Negative for chest pain.  Gastrointestinal: Positive for abdominal pain. Negative for diarrhea, nausea and vomiting.  Genitourinary: Positive for urgency. Negative for dysuria, flank pain, genital sores, hematuria, menstrual problem, vaginal bleeding, vaginal discharge and vaginal pain.  Musculoskeletal: Negative for back pain.  Skin: Negative for rash.  Neurological: Negative for dizziness, light-headedness and headaches.     Physical Exam Triage Vital Signs ED Triage Vitals  Enc Vitals Group     BP      Pulse      Resp      Temp      Temp src      SpO2      Weight      Height      Head Circumference      Peak Flow      Pain Score      Pain Loc      Pain Edu?      Excl. in GC?    No data found.  Updated Vital Signs BP 132/84 (BP Location: Right Arm)   Pulse 72   Temp 98.1 F (36.7 C) (Oral)   Resp 20   LMP 06/06/2020   SpO2 96%   Visual Acuity Right Eye Distance:   Left Eye Distance:   Bilateral Distance:    Right Eye Near:   Left Eye Near:    Bilateral Near:     Physical Exam Vitals and nursing note reviewed.  Constitutional:      Appearance: She is well-developed and well-nourished.     Comments: No acute distress  HENT:     Head: Normocephalic and atraumatic.     Nose: Nose normal.  Eyes:     Conjunctiva/sclera: Conjunctivae normal.  Cardiovascular:     Rate and Rhythm: Normal rate.  Pulmonary:      Effort: Pulmonary effort is normal. No respiratory distress.  Abdominal:     General: There is no distension.     Comments: Soft, nondistended, mild tenderness to palpation in left lower quadrant extending into flank  Musculoskeletal:  General: Normal range of motion.     Cervical back: Neck supple.  Skin:    General: Skin is warm and dry.  Neurological:     Mental Status: She is alert and oriented to person, place, and time.  Psychiatric:        Mood and Affect: Mood and affect normal.      UC Treatments / Results  Labs (all labs ordered are listed, but only abnormal results are displayed) Labs Reviewed  POCT URINALYSIS DIP (MANUAL ENTRY) - Abnormal; Notable for the following components:      Result Value   Blood, UA trace-intact (*)    Protein Ur, POC trace (*)    All other components within normal limits  POCT URINE PREGNANCY  CERVICOVAGINAL ANCILLARY ONLY    EKG   Radiology No results found.  Procedures Procedures (including critical care time)  Medications Ordered in UC Medications - No data to display  Initial Impression / Assessment and Plan / UC Course  I have reviewed the triage vital signs and the nursing notes.  Pertinent labs & imaging results that were available during my care of the patient were reviewed by me and considered in my medical decision making (see chart for details).     UA with negative leuks and nitrites, does have trace hemoglobin, not consistent with UTI.  Vaginal swab pending to screen for any vaginal causes of symptoms, but currently declining any vaginal symptoms.  Providing Naprosyn to use for discomfort in the meantime, recommending following up with OB/GYN, possible outpatient transvaginal ultrasound to evaluate uterus/ovaries for possible cyst or other pathology contributing to pressure.  Discussed strict return precautions. Patient verbalized understanding and is agreeable with plan.  Final Clinical Impressions(s) / UC  Diagnoses   Final diagnoses:  Generalized abdominal pain  Pelvic pressure in female     Discharge Instructions     Urine without signs of UTI, vaginal swab pending to screen for any vaginal infections Naprosyn twice daily for pain in the meantime Follow-up with OB/GYN for further evaluation and imaging of pelvic pressure/pain Please return if any symptoms not improving or worsening    ED Prescriptions    Medication Sig Dispense Auth. Provider   naproxen (NAPROSYN) 500 MG tablet Take 1 tablet (500 mg total) by mouth 2 (two) times daily. 30 tablet Rhyli Depaula, [place] C, PA-C     PDMP not reviewed this encounter.   Lew Dawes, [place] 06/24/20 1444

## 2020-06-24 NOTE — Progress Notes (Signed)
Message sent to patient to get further information regarding back and belly pain with current symptoms. Awaiting patient response.

## 2020-06-24 NOTE — ED Triage Notes (Signed)
Pt presents with urinary urgency and left side flank pain for past few days.

## 2020-06-24 NOTE — Discharge Instructions (Addendum)
Urine without signs of UTI, vaginal swab pending to screen for any vaginal infections Naprosyn twice daily for pain in the meantime Follow-up with OB/GYN for further evaluation and imaging of pelvic pressure/pain Please return if any symptoms not improving or worsening

## 2020-06-24 NOTE — Progress Notes (Signed)
Based on what you shared with me, I feel your condition warrants further evaluation and I recommend that you be seen for a face to face office visit. I would agree that there is likely a UTI present. Giving recent surgical procedure and left lower pain, you need examination to make sure there is no concern for more complicated UTI or a pelvic organ issue from recent surgery. Please call your OB/GYN for evaluation today giving symptoms. If they are unable to see you, you can be seen at the areas listed below.    NOTE: If you entered your credit card information for this eVisit, you will not be charged. You may see a "hold" on your card for the $35 but that hold will drop off and you will not have a charge processed.   If you are having a true medical emergency please call 911.      For an urgent face to face visit, [place] has five urgent care centers for your convenience:     [facility] at [facility] Directions [phone] [address] [place], [place] [address] . 10 am - 6pm Monday - Friday    [facility]) Get Driving Directions [phone] [address] [place], [place] [address] . 10 am to 8 pm Monday-Friday . 12 pm to 8 pm [facility] Urgent Care at [facility] Get Driving Directions [phone] [address], [address] [place], [place] [address] . 8 am to 8 pm Monday-Friday . 9 am to 6 pm Saturday . 11 am to 6 pm Sunday     [facility] Urgent Care at [facility] Get Driving Directions  [phone] [address] [place], [place] [address] . 8 am to 8 pm Monday-Friday . 8 am to 4 pm [facility] [place] Urgent Care at [facility] Directions [phone] [address] [place], [place] [address] . 12 pm to 6 pm Monday-Friday      Your e-visit answers were reviewed by a board certified advanced clinical  practitioner to complete your personal care plan.  Thank you for using e-Visits.

## 2020-06-27 ENCOUNTER — Telehealth (HOSPITAL_COMMUNITY): Payer: Self-pay | Admitting: Emergency Medicine

## 2020-06-27 LAB — CERVICOVAGINAL ANCILLARY ONLY
Bacterial Vaginitis (gardnerella): POSITIVE — AB
Candida Glabrata: NEGATIVE
Candida Vaginitis: NEGATIVE
Chlamydia: NEGATIVE
Comment: NEGATIVE
Comment: NEGATIVE
Comment: NEGATIVE
Comment: NEGATIVE
Comment: NEGATIVE
Comment: NORMAL
Neisseria Gonorrhea: NEGATIVE
Trichomonas: NEGATIVE

## 2020-06-27 MED ORDER — METRONIDAZOLE 500 MG PO TABS: 500.0000 mg | ORAL_TABLET | Freq: Two times a day (BID) | ORAL | 0 refills | Status: DC

## 2020-07-04 ENCOUNTER — Encounter: Payer: Self-pay | Admitting: Obstetrics and Gynecology

## 2020-07-04 ENCOUNTER — Telehealth (INDEPENDENT_AMBULATORY_CARE_PROVIDER_SITE_OTHER): Payer: Medicaid Other | Admitting: Obstetrics and Gynecology

## 2020-07-04 DIAGNOSIS — O139 Gestational [pregnancy-induced] hypertension without significant proteinuria, unspecified trimester: Secondary | ICD-10-CM

## 2020-07-04 DIAGNOSIS — Z9851 Tubal ligation status: Secondary | ICD-10-CM

## 2020-07-04 DIAGNOSIS — Z98891 History of uterine scar from previous surgery: Secondary | ICD-10-CM

## 2020-07-04 DIAGNOSIS — O135 Gestational [pregnancy-induced] hypertension without significant proteinuria, complicating the puerperium: Secondary | ICD-10-CM

## 2020-07-04 NOTE — Progress Notes (Signed)
     I connected with@ on 07/04/20 at  3:55 PM EST by: Mychart video and verified that I am speaking with the correct person using two identifiers.  Patient is located at home and provider is located at [employer] [facility] [employer] at [employer] [facility] .     The purpose of this virtual visit is to provide medical care while limiting exposure to the novel coronavirus. I discussed the limitations, risks, security and privacy concerns of performing an evaluation and management service by  and the availability of in person appointments. I also discussed with the patient that there may be a patient responsible charge related to this service. By engaging in this virtual visit, you consent to the provision of healthcare.  Additionally, you authorize for your insurance to be billed for the services provided during this visit.  The patient expressed understanding and agreed to proceed.  The following staff members participated in the virtual visit:  CMA and MD  Post Partum Visit Note Subjective:   Terri Tran is a 30 y.o. [phone] female being evaluated for postpartum followup.  She is 8 weeks postpartum following a repeat cesarean section at  39/1 gestational weeks.  I have fully reviewed the prenatal and intrapartum course; pregnancy complicated by [facility].  Postpartum course has been unremarkable. Baby is doing well. Baby is feeding by bottle - [employer] Good Start. Bleeding moderate lochia. Bowel function is normal. Bladder function is normal. Patient is not sexually active. Contraception method is tubal ligation. Postpartum depression screening: negative.   The pregnancy intention screening data noted above was reviewed. Potential methods of contraception were discussed. The patient elected to proceed with Female Sterilization.   The following portions of the patient's history were reviewed and updated as appropriate: allergies, current medications, past family history, past medical history, past  social history, past surgical history and problem list.  Review of Systems Pertinent items are noted in HPI.   Objective:  There were no vitals filed for this visit. Self-Obtained       Assessment:    NL  postpartum exam.  H/O GHTN  Plan:  Essential components of care per ACOG recommendations:  1.  Mood and well being: Patient with negative depression screening today. Reviewed local resources for support.  - Patient does not use tobacco.  - hx of drug use? No    2. Infant care and feeding:  -Patient currently breastmilk feeding? No -Social determinants of health (SDOH) reviewed in EPIC. No concerns  3. Sexuality, contraception and birth spacing - Patient does not want a pregnancy in the next year.  Desired family size is completed. Pt had BTL at time of C section  4. Sleep and fatigue -Encouraged family/partner/community support of 4 hrs of uninterrupted sleep to help with mood and fatigue  5. Physical Recovery  - Discussed patients delivery - Patient has urinary incontinence? No - Patient is safe to resume physical and sexual activity  6.  Health Maintenance - Last pap smear done 10/21 and was normal with negative HPV.   7. Chronic Disease Has completed Procardia. BP is normal. Encouraged to continue to monitor BP and follow up with PCP as needed  8 minutes of non-face-to-face time spent with the patient   Nettie Elm, MD [facility] for [employer], [facility]

## 2020-12-07 ENCOUNTER — Ambulatory Visit: Payer: Medicaid Other

## 2020-12-07 ENCOUNTER — Ambulatory Visit
Admission: EM | Admit: 2020-12-07 | Discharge: 2020-12-07 | Disposition: A | Discharge status: Home / Self Care | Payer: Medicaid Other | Attending: Family Medicine | Admitting: Family Medicine

## 2020-12-07 ENCOUNTER — Other Ambulatory Visit: Payer: Self-pay

## 2020-12-07 DIAGNOSIS — H60392 Other infective otitis externa, left ear: Secondary | ICD-10-CM | POA: Diagnosis not present

## 2020-12-07 MED ORDER — KETOROLAC TROMETHAMINE 30 MG/ML IJ SOLN
30.0000 mg | Freq: Once | INTRAMUSCULAR | Status: AC
  Administered 2020-12-07: 30 mg via INTRAMUSCULAR

## 2020-12-07 MED ORDER — AMOXICILLIN-POT CLAVULANATE 875-125 MG PO TABS: 1.0000 | ORAL_TABLET | Freq: Two times a day (BID) | ORAL | 0 refills | Status: DC

## 2020-12-07 NOTE — ED Triage Notes (Signed)
Pt presents with c/o left ear pain for past couple  days after swimming

## 2020-12-07 NOTE — ED Provider Notes (Signed)
[facility]    CSN: [id number] Arrival date & time: 12/07/20  1653      History   Chief Complaint Chief Complaint  Patient presents with   Otalgia    HPI Terri Tran is a 30 y.o. female.   Patient presenting today with 3-day history of acutely worsening left outer ear pain, redness, swelling, muffled hearing.  States this all started after she swam in the ocean on vacation last week.  Has been trying some over-the-counter eardrop kits that have been making it worse so she stopped.  Not taking anything over-the-counter for pain thus far.  Denies fever, chills, headache, nausea, vomiting, history of past ear issues.   Past Medical History:  Diagnosis Date   Abnormal genetic test 10/01/2018   SMA carrier Rec Fob get tested   Anemia affecting first pregnancy 2013   was taking Iron supplements   Anxiety    Phreesia 10/12/2019   Depression 2013   postpartum depression after first delivery; was prescribed Xanax   GBS bacteriuria 09/13/2018   GERD (gastroesophageal reflux disease)    during pregnancy only; takes Tums; helps   Hypertension    Pregnancy induced hypertension     Patient Active Problem List   Diagnosis Date Noted   History of cesarean section 05/09/2020   Cesarean delivery delivered 05/09/2020   History of bilateral tubal ligation 05/09/2020   History of cesarean delivery 03/15/2020   Depression 10/14/2019   Generalized anxiety disorder 10/14/2019   History of GBS bacteriuria 09/13/2018   Gestational hypertension 08/31/2016   Obesity in pregnancy 08/28/2016   BMI 50.0-59.9, adult (HCC) 08/28/2016    Past Surgical History:  Procedure Laterality Date   CESAREAN SECTION     CESAREAN SECTION N/A 09/02/2016   Procedure: CESAREAN SECTION;  Surgeon: Lesly Dukes, MD;  Location: [facility] BIRTHING SUITES;  Service: Obstetrics;  Laterality: N/A;   CESAREAN SECTION N/A 02/02/2019   Procedure: CESAREAN SECTION;  Surgeon: Levie Heritage, DO;  Location: [facility]  LD ORS;  Service: Obstetrics;  Laterality: N/A;   CESAREAN SECTION N/A    Phreesia 10/12/2019   CESAREAN SECTION N/A 05/09/2020   Procedure: CESAREAN SECTION;  Surgeon: Venora Maples, MD;  Location: [facility] LD ORS;  Service: Obstetrics;  Laterality: N/A;    OB History     Gravida  4   Para  4   Term  4   Preterm  0   AB  0   Living  4      SAB  0   IAB  0   Ectopic  0   Multiple  0   Live Births  4            Home Medications    Prior to Admission medications   Medication Sig Start Date End Date Taking? Authorizing Provider  amoxicillin-clavulanate (AUGMENTIN) 875-125 MG tablet Take 1 tablet by mouth every 12 (twelve) hours. 12/07/20  Yes Particia Nearing, PA-C  metroNIDAZOLE (FLAGYL) 500 MG tablet Take 1 tablet (500 mg total) by mouth 2 (two) times daily. 06/27/20   Merrilee Jansky, MD  NIFEdipine (ADALAT CC) 30 MG 24 hr tablet TAKE 1 TABLET (30 MG TOTAL) BY MOUTH DAILY. 05/11/20 05/11/21  Sheila Oats, MD  prenatal vitamin w/FE, FA (PRENATAL 1 + 1) 27-1 MG TABS tablet Take 1 tablet by mouth daily at 12 noon. 04/21/20   Saltaire Bing, MD    Family History Family History  Problem Relation Age of  Onset   Hypertension Mother     Social History Social History   Tobacco Use   Smoking status: Former    Packs/day: 0.50    Types: Cigarettes    Quit date: 07/21/2018    Years since quitting: 2.3   Smokeless tobacco: Never  Vaping Use   Vaping Use: Never used  Substance Use Topics   Alcohol use: No    Alcohol/week: 7.0 standard drinks    Types: 7 Cans of beer per week   Drug use: No     Allergies   Patient has no known allergies.   Review of Systems Review of Systems Per HPI  Physical Exam Triage Vital Signs ED Triage Vitals  Enc Vitals Group     BP 12/07/20 1706 (!) 157/103     Pulse Rate 12/07/20 1706 73     Resp 12/07/20 1706 18     Temp 12/07/20 1706 98.4 F (36.9 C)     Temp src --      SpO2 12/07/20 1706 98 %     Weight  --      Height --      Head Circumference --      Peak Flow --      Pain Score 12/07/20 1705 10     Pain Loc --      Pain Edu? --      Excl. in GC? --    No data found.  Updated Vital Signs BP (!) 157/103   Pulse 73   Temp 98.4 F (36.9 C)   Resp 18   SpO2 98%   Visual Acuity Right Eye Distance:   Left Eye Distance:   Bilateral Distance:    Right Eye Near:   Left Eye Near:    Bilateral Near:     Physical Exam Vitals and nursing note reviewed.  Constitutional:      Appearance: Normal appearance. She is not ill-appearing.  HENT:     Head: Atraumatic.     Ears:     Comments: Right EAC mildly erythematous but otherwise EAC and TM benign Left EAC significantly erythematous, edematous with significant tenderness to palpation, could not pass otoscope tip fully into ear to visualize TM well but TM did appear intact    Nose: Nose normal.     Mouth/Throat:     Mouth: Mucous membranes are moist.     Pharynx: Oropharynx is clear.  Eyes:     Extraocular Movements: Extraocular movements intact.     Conjunctiva/sclera: Conjunctivae normal.  Cardiovascular:     Rate and Rhythm: Normal rate and regular rhythm.     Heart sounds: Normal heart sounds.  Pulmonary:     Effort: Pulmonary effort is normal.     Breath sounds: Normal breath sounds.  Musculoskeletal:        General: Normal range of motion.     Cervical back: Normal range of motion and neck supple.  Skin:    General: Skin is warm and dry.  Neurological:     Mental Status: She is alert and oriented to person, place, and time.  Psychiatric:        Mood and Affect: Mood normal.        Thought Content: Thought content normal.        Judgment: Judgment normal.     UC Treatments / Results  Labs (all labs ordered are listed, but only abnormal results are displayed) Labs Reviewed - No data to display  EKG  Radiology No results found.  Procedures Procedures (including critical care time)  Medications Ordered  in UC Medications  ketorolac (TORADOL) 30 MG/ML injection 30 mg (30 mg Intramuscular Given 12/07/20 1736)    Initial Impression / Assessment and Plan / UC Course  I have reviewed the triage vital signs and the nursing notes.  Pertinent labs & imaging results that were available during my care of the patient were reviewed by me and considered in my medical decision making (see chart for details).     Given extent, will treat with oral Augmentin, IM Toradol given for pain relief in clinic as she is tearful due to the pain.  Discussed continued over-the-counter pain relievers, supportive home care in addition to completing full course of antibiotics.  Follow-up with PCP for recheck if not fully resolving.  Final Clinical Impressions(s) / UC Diagnoses   Final diagnoses:  Infective otitis externa of left ear   Discharge Instructions   None    ED Prescriptions     Medication Sig Dispense Auth. Provider   amoxicillin-clavulanate (AUGMENTIN) 875-125 MG tablet Take 1 tablet by mouth every 12 (twelve) hours. 20 tablet Particia Nearing, [place]      PDMP not reviewed this encounter.   Particia Nearing, [place] 12/07/20 1754

## 2020-12-20 ENCOUNTER — Ambulatory Visit: Payer: Medicaid Other

## 2020-12-21 ENCOUNTER — Ambulatory Visit
Admission: RE | Admit: 2020-12-21 | Discharge: 2020-12-21 | Disposition: A | Discharge status: Home / Self Care | Payer: Medicaid Other | Source: Ambulatory Visit | Attending: Family Medicine | Admitting: Family Medicine

## 2020-12-21 ENCOUNTER — Other Ambulatory Visit: Payer: Self-pay

## 2020-12-21 VITALS — BP 159/96 | HR 76 | Temp 98.4°F | Resp 18

## 2020-12-21 DIAGNOSIS — H60392 Other infective otitis externa, left ear: Secondary | ICD-10-CM | POA: Diagnosis not present

## 2020-12-21 MED ORDER — CIPROFLOXACIN-DEXAMETHASONE 0.3-0.1 % OT SUSP: 4.0000 [drp] | Freq: Two times a day (BID) | OTIC | 0 refills | Status: DC

## 2020-12-21 MED ORDER — DOXYCYCLINE HYCLATE 100 MG PO TABS: 100.0000 mg | ORAL_TABLET | Freq: Two times a day (BID) | ORAL | 0 refills | Status: DC

## 2020-12-21 NOTE — ED Provider Notes (Signed)
[facility]    CSN: [id number] Arrival date & time: 12/21/20  1058      History   Chief Complaint Chief Complaint  Patient presents with   left ear pain    HPI Terri Tran is a 30 y.o. female.   Patient presenting today with ongoing left outer ear pain, swelling, muffled hearing.  States she came 2 weeks ago, was treated with Augmentin which helped mildly with the inner ear pain but she is still having redness, swelling to the outer ear and significant pain and pressure.  Denies fever, chills, headache, nausea, vomiting, drainage from the ear.   Past Medical History:  Diagnosis Date   Abnormal genetic test 10/01/2018   SMA carrier Rec Fob get tested   Anemia affecting first pregnancy 2013   was taking Iron supplements   Anxiety    Phreesia 10/12/2019   Depression 2013   postpartum depression after first delivery; was prescribed Xanax   GBS bacteriuria 09/13/2018   GERD (gastroesophageal reflux disease)    during pregnancy only; takes Tums; helps   Hypertension    Pregnancy induced hypertension     Patient Active Problem List   Diagnosis Date Noted   History of cesarean section 05/09/2020   Cesarean delivery delivered 05/09/2020   History of bilateral tubal ligation 05/09/2020   History of cesarean delivery 03/15/2020   Depression 10/14/2019   Generalized anxiety disorder 10/14/2019   History of GBS bacteriuria 09/13/2018   Gestational hypertension 08/31/2016   Obesity in pregnancy 08/28/2016   BMI 50.0-59.9, adult (HCC) 08/28/2016    Past Surgical History:  Procedure Laterality Date   CESAREAN SECTION     CESAREAN SECTION N/A 09/02/2016   Procedure: CESAREAN SECTION;  Surgeon: Lesly Dukes, MD;  Location: [facility] BIRTHING SUITES;  Service: Obstetrics;  Laterality: N/A;   CESAREAN SECTION N/A 02/02/2019   Procedure: CESAREAN SECTION;  Surgeon: Levie Heritage, DO;  Location: [facility] LD ORS;  Service: Obstetrics;  Laterality: N/A;   CESAREAN SECTION N/A     Phreesia 10/12/2019   CESAREAN SECTION N/A 05/09/2020   Procedure: CESAREAN SECTION;  Surgeon: Venora Maples, MD;  Location: [facility] LD ORS;  Service: Obstetrics;  Laterality: N/A;    OB History     Gravida  4   Para  4   Term  4   Preterm  0   AB  0   Living  4      SAB  0   IAB  0   Ectopic  0   Multiple  0   Live Births  4            Home Medications    Prior to Admission medications   Medication Sig Start Date End Date Taking? Authorizing Provider  ciprofloxacin-dexamethasone (CIPRODEX) OTIC suspension Place 4 drops into the left ear 2 (two) times daily. 12/21/20  Yes Particia Nearing, PA-C  doxycycline (VIBRA-TABS) 100 MG tablet Take 1 tablet (100 mg total) by mouth 2 (two) times daily. 12/21/20  Yes Particia Nearing, PA-C  metroNIDAZOLE (FLAGYL) 500 MG tablet Take 1 tablet (500 mg total) by mouth 2 (two) times daily. 06/27/20   Merrilee Jansky, MD  NIFEdipine (ADALAT CC) 30 MG 24 hr tablet TAKE 1 TABLET (30 MG TOTAL) BY MOUTH DAILY. 05/11/20 05/11/21  Sheila Oats, MD  prenatal vitamin w/FE, FA (PRENATAL 1 + 1) 27-1 MG TABS tablet Take 1 tablet by mouth daily at 12 noon. 04/21/20   Bing, MD    Family History Family History  Problem Relation Age of Onset   Hypertension Mother     Social History Social History   Tobacco Use   Smoking status: Some Days    Packs/day: 0.50    Types: Cigarettes    Last attempt to quit: 07/21/2018    Years since quitting: 2.4   Smokeless tobacco: Never  Vaping Use   Vaping Use: Never used  Substance Use Topics   Alcohol use: No    Alcohol/week: 7.0 standard drinks    Types: 7 Cans of beer per week   Drug use: No     Allergies   Patient has no known allergies.   Review of Systems Review of Systems Per HPI  Physical Exam Triage Vital Signs ED Triage Vitals [12/21/20 1109]  Enc Vitals Group     BP (!) 159/96     Pulse Rate 76     Resp 18     Temp 98.4 F (36.9 C)     Temp  Source Oral     SpO2 97 %     Weight      Height      Head Circumference      Peak Flow      Pain Score 7     Pain Loc      Pain Edu?      Excl. in GC?    No data found.  Updated Vital Signs BP (!) 159/96 (BP Location: Left Arm)   Pulse 76   Temp 98.4 F (36.9 C) (Oral)   Resp 18   LMP 12/14/2020 (Exact Date)   SpO2 97%   Visual Acuity Right Eye Distance:   Left Eye Distance:   Bilateral Distance:    Right Eye Near:   Left Eye Near:    Bilateral Near:     Physical Exam Vitals and nursing note reviewed.  Constitutional:      Appearance: Normal appearance. She is not ill-appearing.  HENT:     Head: Atraumatic.     Ears:     Comments: Right outer ear benign, TM intact and benign on the right Left outer ear erythematous, edematous, tender to palpation.  Left EAC mildly erythematous and edematous.  TM intact and benign    Nose: Nose normal.     Mouth/Throat:     Mouth: Mucous membranes are moist.  Eyes:     Extraocular Movements: Extraocular movements intact.     Conjunctiva/sclera: Conjunctivae normal.  Cardiovascular:     Rate and Rhythm: Normal rate and regular rhythm.     Heart sounds: Normal heart sounds.  Pulmonary:     Effort: Pulmonary effort is normal.     Breath sounds: Normal breath sounds.  Abdominal:     General: Bowel sounds are normal. There is no distension.     Palpations: Abdomen is soft.     Tenderness: There is no abdominal tenderness. There is no guarding.  Musculoskeletal:        General: Normal range of motion.     Cervical back: Normal range of motion and neck supple.  Skin:    General: Skin is warm and dry.  Neurological:     Mental Status: She is alert and oriented to person, place, and time.  Psychiatric:        Mood and Affect: Mood normal.        Thought Content: Thought content normal.        Judgment:  Judgment normal.     UC Treatments / Results  Labs (all labs ordered are listed, but only abnormal results are  displayed) Labs Reviewed - No data to display  EKG   Radiology No results found.  Procedures Procedures (including critical care time)  Medications Ordered in UC Medications - No data to display  Initial Impression / Assessment and Plan / UC Course  I have reviewed the triage vital signs and the nursing notes.  Pertinent labs & imaging results that were available during my care of the patient were reviewed by me and considered in my medical decision making (see chart for details).     Appears to still have an outer ear infection, treat with more broad-spectrum antibiotic so we will switch to doxycycline as Augmentin did not fully resolve issue, Ciprodex drops given additionally for further coverage.  Tylenol ibuprofen as needed for pain relief.  Return for acutely worsening symptoms.  Final Clinical Impressions(s) / UC Diagnoses   Final diagnoses:  Infective otitis externa of left ear   Discharge Instructions   None    ED Prescriptions     Medication Sig Dispense Auth. Provider   doxycycline (VIBRA-TABS) 100 MG tablet Take 1 tablet (100 mg total) by mouth 2 (two) times daily. 14 tablet Roosvelt Maser [place], [place]   ciprofloxacin-dexamethasone [facility]) OTIC suspension Place 4 drops into the left ear 2 (two) times daily. 7.5 mL Particia Nearing, [place]      PDMP not reviewed this encounter.   Particia Nearing, [place] 12/21/20 1156

## 2020-12-21 NOTE — ED Triage Notes (Signed)
Pt c/o left ear pain. States she was swimming a couple of weeks ago when this started, was seen here two weeks ago but the treatment has not relieved the throbbing stinging achy 7/10 pain that is constant and worse at night. States she has not seen drainage but can hear and feel fluid in her inner ear. States she cannot hear out of affected left ear.

## 2021-02-03 ENCOUNTER — Telehealth: Payer: Medicaid Other | Admitting: Family

## 2021-02-03 DIAGNOSIS — R0602 Shortness of breath: Secondary | ICD-10-CM

## 2021-02-03 DIAGNOSIS — U071 COVID-19: Secondary | ICD-10-CM

## 2021-02-03 NOTE — Progress Notes (Signed)
Based on what you shared with me, I feel your condition warrants further evaluation and I recommend that you be seen in a face to face visit.   Given your shortness of breath, you need to do a video visit to get the antiviral medications.    NOTE: There will be NO CHARGE for this eVisit   If you are having a true medical emergency please call 911.      For an urgent face to face visit, [place] has six urgent care centers for your convenience:     [facility] at [facility] Directions [phone] [address] [place], [place] [address]    [facility]) Get Driving Directions [phone] [address] [place], [place] [address]  [facility]) Get Driving Directions [phone] [address] [place],  [place]  [address]  [facility] Urgent Care at [facility] Get Driving Directions [phone] [address], [address] [place], [place] [address]   [facility] Health Urgent Care at [facility] Get Driving Directions  [phone] [address] [place], [place] [address]   [facility] Health Urgent Care at [facility] Directions [phone] [address] [place], [place] [address]  Your MyChart E-visit questionnaire answers were reviewed by a board certified advanced clinical practitioner to complete your personal care plan based on your specific symptoms.  Thank you for using e-Visits.

## 2021-02-04 ENCOUNTER — Ambulatory Visit: Payer: Medicaid Other

## 2021-10-13 ENCOUNTER — Ambulatory Visit
Admission: RE | Admit: 2021-10-13 | Discharge: 2021-10-13 | Disposition: A | Discharge status: Home / Self Care | Payer: Medicaid Other | Source: Ambulatory Visit | Attending: Physician Assistant | Admitting: Physician Assistant

## 2021-10-13 VITALS — BP 165/93 | HR 74 | Temp 98.1°F | Resp 16

## 2021-10-13 DIAGNOSIS — R300 Dysuria: Secondary | ICD-10-CM | POA: Insufficient documentation

## 2021-10-13 LAB — POCT URINALYSIS DIP (MANUAL ENTRY)
Bilirubin, UA: NEGATIVE
Glucose, UA: NEGATIVE mg/dL
Ketones, POC UA: NEGATIVE mg/dL
Leukocytes, UA: NEGATIVE
Nitrite, UA: NEGATIVE
Protein Ur, POC: NEGATIVE mg/dL
Spec Grav, UA: >=1.03 — AB (ref 1.010–1.025)
Urobilinogen, UA: 0.2 E.U./dL
pH, UA: 5.5 (ref 5.0–8.0)

## 2021-10-13 LAB — POCT URINE PREGNANCY: Preg Test, Ur: NEGATIVE

## 2021-10-13 MED ORDER — CEFDINIR 300 MG PO CAPS: 300.0000 mg | ORAL_CAPSULE | Freq: Two times a day (BID) | ORAL | Status: DC

## 2021-10-13 MED ORDER — CEFDINIR 300 MG PO CAPS: 300.0000 mg | ORAL_CAPSULE | Freq: Two times a day (BID) | ORAL | 0 refills | Status: DC

## 2021-10-13 NOTE — ED Triage Notes (Signed)
Patient c/o urinary frequency x 2 weeks.   Patient endorses bilateral lower back pain. Patient endorses cloudy urine.   Patient denies dysuria. Patient denies ABD pain.   Patient denies vaginal discharge.  Patient has taken AZO with no relief of symptoms.   Patient c/o LFT ear fullness x 2-3 days.   Patient endorses ear pain.   Patient endorses hearing difficulties.   Patient has used OTC ear drops with no relief of symptoms.

## 2021-10-13 NOTE — Discharge Instructions (Addendum)
You have test pending 

## 2021-10-14 NOTE — ED Provider Notes (Signed)
[facility]    CSN: [id number] Arrival date & time: 10/13/21  1151      History   Chief Complaint Chief Complaint  Patient presents with   Urinary Frequency   Ear Fullness    HPI Terri Tran is a 31 y.o. female.   Patient complains of discomfort with urination for 2 weeks patient also reports she has chronic ear infections and is having pain in her left ear patient feels like she has an infection again.  Request treatment she asked that she not be put on amoxicillin as this gives her problems with yeast  The history is provided by the patient. No language interpreter was used.  Urinary Frequency This is a new problem. Nothing aggravates the symptoms. Nothing relieves the symptoms. She has tried nothing for the symptoms.  Ear Fullness   Past Medical History:  Diagnosis Date   Abnormal genetic test 10/01/2018   SMA carrier Rec Fob get tested   Anemia affecting first pregnancy 2013   was taking Iron supplements   Anxiety    Phreesia 10/12/2019   Depression 2013   postpartum depression after first delivery; was prescribed Xanax   GBS bacteriuria 09/13/2018   GERD (gastroesophageal reflux disease)    during pregnancy only; takes Tums; helps   Hypertension    Pregnancy induced hypertension     Patient Active Problem List   Diagnosis Date Noted   History of cesarean section 05/09/2020   Cesarean delivery delivered 05/09/2020   History of bilateral tubal ligation 05/09/2020   History of cesarean delivery 03/15/2020   Depression 10/14/2019   Generalized anxiety disorder 10/14/2019   History of GBS bacteriuria 09/13/2018   Gestational hypertension 08/31/2016   Obesity in pregnancy 08/28/2016   BMI 50.0-59.9, adult (HCC) 08/28/2016    Past Surgical History:  Procedure Laterality Date   CESAREAN SECTION     CESAREAN SECTION N/A 09/02/2016   Procedure: CESAREAN SECTION;  Surgeon: Lesly Dukes, MD;  Location: [facility] BIRTHING SUITES;  Service: Obstetrics;   Laterality: N/A;   CESAREAN SECTION N/A 02/02/2019   Procedure: CESAREAN SECTION;  Surgeon: Levie Heritage, DO;  Location: [facility] LD ORS;  Service: Obstetrics;  Laterality: N/A;   CESAREAN SECTION N/A    Phreesia 10/12/2019   CESAREAN SECTION N/A 05/09/2020   Procedure: CESAREAN SECTION;  Surgeon: Venora Maples, MD;  Location: [facility] LD ORS;  Service: Obstetrics;  Laterality: N/A;    OB History     Gravida  4   Para  4   Term  4   Preterm  0   AB  0   Living  4      SAB  0   IAB  0   Ectopic  0   Multiple  0   Live Births  4            Home Medications    Prior to Admission medications   Medication Sig Start Date End Date Taking? Authorizing Provider  cefdinir (OMNICEF) 300 MG capsule Take 1 capsule (300 mg total) by mouth 2 (two) times daily. 10/13/21  Yes Cheron Schaumann K, PA-C  NIFEdipine (ADALAT CC) 30 MG 24 hr tablet TAKE 1 TABLET (30 MG TOTAL) BY MOUTH DAILY. 05/11/20 05/11/21  Sheila Oats, MD  prenatal vitamin w/FE, FA (PRENATAL 1 + 1) 27-1 MG TABS tablet Take 1 tablet by mouth daily at 12 noon. 04/21/20   [place] Bing, MD    Family History Family History  Problem Relation Age of Onset   Hypertension Mother     Social History Social History   Tobacco Use   Smoking status: Some Days    Packs/day: 0.50    Types: Cigarettes    Last attempt to quit: 07/21/2018    Years since quitting: 3.2   Smokeless tobacco: Never  Vaping Use   Vaping Use: Never used  Substance Use Topics   Alcohol use: No    Alcohol/week: 7.0 standard drinks    Types: 7 Cans of beer per week   Drug use: No     Allergies   Patient has no known allergies.   Review of Systems Review of Systems  HENT:  Positive for ear pain.   Genitourinary:  Positive for frequency.  All other systems reviewed and are negative.   Physical Exam Triage Vital Signs ED Triage Vitals  Enc Vitals Group     BP 10/13/21 1210 (!) 165/93     Pulse Rate 10/13/21 1210 74     Resp  10/13/21 1210 16     Temp 10/13/21 1210 98.1 F (36.7 C)     Temp Source 10/13/21 1210 Oral     SpO2 10/13/21 1210 98 %     Weight --      Height --      Head Circumference --      Peak Flow --      Pain Score 10/13/21 1216 7     Pain Loc --      Pain Edu? --      Excl. in GC? --    No data found.  Updated Vital Signs BP (!) 165/93 (BP Location: Right Arm)   Pulse 74   Temp 98.1 F (36.7 C) (Oral)   Resp 16   LMP 10/04/2021 (Approximate)   SpO2 98%   Breastfeeding No   Visual Acuity Right Eye Distance:   Left Eye Distance:   Bilateral Distance:    Right Eye Near:   Left Eye Near:    Bilateral Near:     Physical Exam Vitals and nursing note reviewed.  Constitutional:      Appearance: She is well-developed.  HENT:     Head: Normocephalic.     Ears:     Comments: Left TM erythematous bulging scarred    Mouth/Throat:     Mouth: Mucous membranes are moist.  Pulmonary:     Effort: Pulmonary effort is normal.  Abdominal:     General: There is no distension.  Musculoskeletal:        General: Normal range of motion.     Cervical back: Normal range of motion.  Neurological:     Mental Status: She is alert and oriented to person, place, and time.     UC Treatments / Results  Labs (all labs ordered are listed, but only abnormal results are displayed) Labs Reviewed  POCT URINALYSIS DIP (MANUAL ENTRY) - Abnormal; Notable for the following components:      Result Value   Clarity, UA cloudy (*)    Spec Grav, UA >=1.030 (*)    Blood, UA trace-intact (*)    All other components within normal limits  URINE CULTURE  POCT URINE PREGNANCY  CERVICOVAGINAL ANCILLARY ONLY    EKG   Radiology No results found.  Procedures Procedures (including critical care time)  Medications Ordered in UC Medications - No data to display  Initial Impression / Assessment and Plan / UC Course  I have reviewed the triage  vital signs and the nursing notes.  Pertinent labs &  imaging results that were available during my care of the patient were reviewed by me and considered in my medical decision making (see chart for details).     UA shows a trace of blood culture ordered.  I will treat patient's ear infection Final Clinical Impressions(s) / UC Diagnoses   Final diagnoses:  Dysuria     Discharge Instructions      You have test pending     ED Prescriptions     Medication Sig Dispense Auth. Provider   cefdinir (OMNICEF) 300 MG capsule Take 1 capsule (300 mg total) by mouth 2 (two) times daily. 20 capsule Elson Areas, [place]      PDMP not reviewed this encounter. An After Visit Summary was printed and given to the patient.    Elson Areas, [place] 10/14/21 [phone]

## 2021-10-15 LAB — URINE CULTURE: Culture: 30000 — AB

## 2021-10-17 LAB — CERVICOVAGINAL ANCILLARY ONLY
Bacterial Vaginitis (gardnerella): POSITIVE — AB
Candida Glabrata: NEGATIVE
Candida Vaginitis: NEGATIVE
Chlamydia: NEGATIVE
Comment: NEGATIVE
Comment: NEGATIVE
Comment: NEGATIVE
Comment: NEGATIVE
Comment: NEGATIVE
Comment: NORMAL
Neisseria Gonorrhea: NEGATIVE
Trichomonas: NEGATIVE

## 2021-10-18 ENCOUNTER — Telehealth (HOSPITAL_COMMUNITY): Payer: Self-pay | Admitting: Emergency Medicine

## 2021-10-18 MED ORDER — METRONIDAZOLE 500 MG PO TABS: 500.0000 mg | ORAL_TABLET | Freq: Two times a day (BID) | ORAL | 0 refills | Status: DC

## 2022-02-26 ENCOUNTER — Ambulatory Visit (HOSPITAL_COMMUNITY)
Admission: EM | Admit: 2022-02-26 | Discharge: 2022-02-26 | Disposition: A | Discharge status: Home / Self Care | Payer: Medicaid Other | Attending: Internal Medicine | Admitting: Internal Medicine

## 2022-02-26 ENCOUNTER — Encounter (HOSPITAL_COMMUNITY): Payer: Self-pay

## 2022-02-26 DIAGNOSIS — I10 Essential (primary) hypertension: Secondary | ICD-10-CM | POA: Diagnosis not present

## 2022-02-26 DIAGNOSIS — H6123 Impacted cerumen, bilateral: Secondary | ICD-10-CM | POA: Insufficient documentation

## 2022-02-26 DIAGNOSIS — Z1152 Encounter for screening for COVID-19: Secondary | ICD-10-CM | POA: Insufficient documentation

## 2022-02-26 DIAGNOSIS — J069 Acute upper respiratory infection, unspecified: Secondary | ICD-10-CM | POA: Insufficient documentation

## 2022-02-26 MED ORDER — KETOROLAC TROMETHAMINE 30 MG/ML IJ SOLN
INTRAMUSCULAR | Status: AC
  Filled 2022-02-26: qty 1

## 2022-02-26 MED ORDER — KETOROLAC TROMETHAMINE 30 MG/ML IJ SOLN
30.0000 mg | Freq: Once | INTRAMUSCULAR | Status: AC
  Administered 2022-02-26: 30 mg via INTRAMUSCULAR

## 2022-02-26 MED ORDER — ACETAMINOPHEN 325 MG PO TABS
975.0000 mg | ORAL_TABLET | Freq: Once | ORAL | Status: AC
  Administered 2022-02-26: 975 mg via ORAL

## 2022-02-26 MED ORDER — ACETAMINOPHEN 325 MG PO TABS
ORAL_TABLET | ORAL | Status: AC
  Filled 2022-02-26: qty 3

## 2022-02-26 MED ORDER — AMLODIPINE BESYLATE 5 MG PO TABS: 5.0000 mg | ORAL_TABLET | Freq: Every day | ORAL | 1 refills | Status: DC

## 2022-02-26 MED ORDER — GUAIFENESIN ER 1200 MG PO TB12: 1200.0000 mg | ORAL_TABLET | Freq: Two times a day (BID) | ORAL | 0 refills | Status: DC

## 2022-02-26 MED ORDER — PROMETHAZINE-DM 6.25-15 MG/5ML PO SYRP: 5.0000 mL | ORAL_SOLUTION | Freq: Every evening | ORAL | 0 refills | Status: DC | PRN

## 2022-02-26 MED ORDER — BLOOD PRESSURE CUFF MISC: 0 refills | Status: AC

## 2022-02-26 NOTE — ED Triage Notes (Signed)
Cough, congestion, and runny nose for around a month. States her kids are in daycare, sick first, but Patient having increased chest congestion and symptoms not going away.  No medical history of respiratory issues but having SOB.  No one has tested positive for Covid.

## 2022-02-26 NOTE — ED Provider Notes (Signed)
[place]    CSN: [id number] Arrival date & time: 02/26/22  [id number]      History   Chief Complaint Chief Complaint  Patient presents with   Cough   Nasal Congestion    HPI Terri Tran is a 31 y.o. female.   Patient presents to urgent care for evaluation of sore throat, nasal congestion, body aches, chills, productive cough, rib cage pain, and fatigue that started on Wednesday February 21, 2022.  She has been sick on and off over the last month since her kids started going to daycare 1 month ago. Chest is "tight" when coughing but resolves after coughing fits.  She is also reporting bilateral rib cage pain as a result of coughing. Cough is productive with yellow/green sputum and worse at nighttime. Nasal congestion is thick and yellow/green as well.  Sore throat is worsened with swallowing and coughing.  Denies vision changes and dizziness. Reports chills but unknown highest temp at home. Denies shortness of breath, chest pain, nausea, vomiting, abdominal pain, diarrhea, and eye drainage.  Denies history of asthma or chronic respiratory problems.  Patient is not a smoker and denies drug use.  She is not vaccinated against COVID-19 and has not received their seasonal flu vaccine this year.  Has attempted use of nyquil, theraflu, elderberry prior to arrival at urgent care for relief of symptoms with some temporary relief that is short lived.  Ibuprofen somewhat helps rib cage pain but she is experiencing 10 on a scale of 0-10 generalized body aches at this time.  Denies chance of pregnancy as she has had her tubes tied.  Blood pressure is noted to be elevated in the clinic today at 183/94.  She is asymptomatic and was on nifedipine during her pregnancy in 2022 but does not currently take any antihypertensive medications and does not have a primary care provider.  Denies leg swelling, headache, blurry vision, and decreased visual acuity/dizziness.    Cough   Past Medical History:   Diagnosis Date   Abnormal genetic test 10/01/2018   SMA carrier Rec Fob get tested   Anemia affecting first pregnancy 2013   was taking Iron supplements   Anxiety    Phreesia 10/12/2019   Depression 2013   postpartum depression after first delivery; was prescribed Xanax   GBS bacteriuria 09/13/2018   GERD (gastroesophageal reflux disease)    during pregnancy only; takes Tums; helps   Hypertension    Pregnancy induced hypertension     Patient Active Problem List   Diagnosis Date Noted   History of cesarean section 05/09/2020   Cesarean delivery delivered 05/09/2020   History of bilateral tubal ligation 05/09/2020   History of cesarean delivery 03/15/2020   Depression 10/14/2019   Generalized anxiety disorder 10/14/2019   History of GBS bacteriuria 09/13/2018   Gestational hypertension 08/31/2016   Obesity in pregnancy 08/28/2016   BMI 50.0-59.9, adult ([facility]) 08/28/2016    Past Surgical History:  Procedure Laterality Date   CESAREAN SECTION     CESAREAN SECTION N/A 09/02/2016   Procedure: CESAREAN SECTION;  Surgeon: Guss Bunde, MD;  Location: [place];  Service: Obstetrics;  Laterality: N/A;   CESAREAN SECTION N/A 02/02/2019   Procedure: CESAREAN SECTION;  Surgeon: Truett Mainland, DO;  Location: [facility];  Service: Obstetrics;  Laterality: N/A;   CESAREAN SECTION N/A    Phreesia 10/12/2019   CESAREAN SECTION N/A 05/09/2020   Procedure: CESAREAN SECTION;  Surgeon: Clarnce Flock, MD;  Location: [facility];  Service: Obstetrics;  Laterality: N/A;    OB History     Gravida  4   Para  4   Term  4   Preterm  0   AB  0   Living  4      SAB  0   IAB  0   Ectopic  0   Multiple  0   Live Births  4            Home Medications    Prior to Admission medications   Medication Sig Start Date End Date Taking? Authorizing Provider  amLODipine (NORVASC) 5 MG tablet Take 1 tablet (5 mg total) by mouth daily. 02/26/22  Yes Talbot Grumbling, FNP  Blood Pressure Monitoring (BLOOD PRESSURE CUFF) MISC Take BP once daily. 02/26/22  Yes Talbot Grumbling, FNP  Guaifenesin 1200 MG TB12 Take 1 tablet (1,200 mg total) by mouth in the morning and at bedtime. 02/26/22  Yes Talbot Grumbling, FNP  promethazine-dextromethorphan (PROMETHAZINE-DM) 6.25-15 MG/5ML syrup Take 5 mLs by mouth at bedtime as needed for cough. 02/26/22  Yes Talbot Grumbling, FNP  cefdinir (OMNICEF) 300 MG capsule Take 1 capsule (300 mg total) by mouth 2 (two) times daily. 10/13/21   Fransico Meadow, PA-C  metroNIDAZOLE (FLAGYL) 500 MG tablet Take 1 tablet (500 mg total) by mouth 2 (two) times daily. 10/18/21   Lamptey, Myrene Galas, MD  NIFEdipine (ADALAT CC) 30 MG 24 hr tablet TAKE 1 TABLET (30 MG TOTAL) BY MOUTH DAILY. 05/11/20 05/11/21  Randa Ngo, MD  prenatal vitamin w/FE, FA (PRENATAL 1 + 1) 27-1 MG TABS tablet Take 1 tablet by mouth daily at 12 noon. 04/21/20   Aletha Halim, MD    Family History Family History  Problem Relation Age of Onset   Hypertension Mother     Social History Social History   Tobacco Use   Smoking status: Some Days    Packs/day: 0.50    Types: Cigarettes    Last attempt to quit: 07/21/2018    Years since quitting: 3.6   Smokeless tobacco: Never  Vaping Use   Vaping Use: Never used  Substance Use Topics   Alcohol use: No    Alcohol/week: 7.0 standard drinks of alcohol    Types: 7 Cans of beer per week   Drug use: No     Allergies   Patient has no known allergies.   Review of Systems Review of Systems  Respiratory:  Positive for cough.   Per HPI   Physical Exam Triage Vital Signs ED Triage Vitals  Enc Vitals Group     BP 02/26/22 1017 (!) 183/94     Pulse Rate 02/26/22 1017 76     Resp 02/26/22 1017 16     Temp 02/26/22 1017 98.6 F (37 C)     Temp Source 02/26/22 1017 Oral     SpO2 02/26/22 1017 98 %     Weight --      Height --      Head Circumference --      Peak Flow --      Pain Score  02/26/22 1021 10     Pain Loc --      Pain Edu? --      Excl. in [place]? --    No data found.  Updated Vital Signs BP (!) 183/94 (BP Location: Right Wrist)   Pulse 76   Temp 98.6 F (37 C) (  Oral)   Resp 16   LMP 02/05/2022 (Approximate)   SpO2 98%   Breastfeeding No   Visual Acuity Right Eye Distance:   Left Eye Distance:   Bilateral Distance:    Right Eye Near:   Left Eye Near:    Bilateral Near:     Physical Exam Vitals and nursing note reviewed.  Constitutional:      Appearance: She is obese. She is ill-appearing. She is not toxic-appearing.  HENT:     Head: Normocephalic and atraumatic.     Right Ear: Hearing and external ear normal. There is impacted cerumen.     Left Ear: Hearing and external ear normal. There is impacted cerumen.     Nose: Congestion and rhinorrhea present.     Mouth/Throat:     Lips: Pink.     Mouth: Mucous membranes are moist.     Pharynx: Posterior oropharyngeal erythema present.     Comments: Small amount of clear postnasal drainage visualized to the posterior oropharynx.  Eyes:     General: Lids are normal. Vision grossly intact. Gaze aligned appropriately.     Extraocular Movements: Extraocular movements intact.     Conjunctiva/sclera: Conjunctivae normal.     Right eye: Right conjunctiva is not injected.     Left eye: Left conjunctiva is not injected.     Pupils: Pupils are equal, round, and reactive to light.  Cardiovascular:     Rate and Rhythm: Normal rate and regular rhythm.     Heart sounds: Normal heart sounds, S1 normal and S2 normal.  Pulmonary:     Effort: Pulmonary effort is normal. No respiratory distress.     Breath sounds: Normal breath sounds and air entry.     Comments: Clear to auscultation bilaterally without adventitious breath sounds.  No respiratory distress. Musculoskeletal:     Cervical back: Neck supple.  Lymphadenopathy:     Cervical: Cervical adenopathy present.  Skin:    General: Skin is warm and dry.      Capillary Refill: Capillary refill takes less than 2 seconds.     Findings: No rash.  Neurological:     General: No focal deficit present.     Mental Status: She is alert and oriented to person, place, and time. Mental status is at baseline.     Cranial Nerves: Cranial nerves 2-12 are intact. No dysarthria or facial asymmetry.     Sensory: Sensation is intact.     Motor: Motor function is intact. No weakness.     Coordination: Coordination is intact.     Gait: Gait is intact. Gait normal.     Comments: 5/5 strength throughout.   Psychiatric:        Mood and Affect: Mood normal.        Speech: Speech normal.        Behavior: Behavior normal.        Thought Content: Thought content normal.        Judgment: Judgment normal.      UC Treatments / Results  Labs (all labs ordered are listed, but only abnormal results are displayed) Labs Reviewed  SARS CORONAVIRUS 2 (TAT 6-24 HRS)    EKG   Radiology No results found.  Procedures Procedures (including critical care time)  Medications Ordered in UC Medications  ketorolac (TORADOL) 30 MG/ML injection 30 mg (30 mg Intramuscular Given 02/26/22 1116)  acetaminophen (TYLENOL) tablet 975 mg (975 mg Oral Given 02/26/22 1116)    Initial Impression / Assessment and  Plan / UC Course  I have reviewed the triage vital signs and the nursing notes.  Pertinent labs & imaging results that were available during my care of the patient were reviewed by me and considered in my medical decision making (see chart for details).  Viral URI with cough Symptoms and physical exam consistent with a viral upper respiratory tract infection that will likely resolve with rest, fluids, and prescriptions for symptomatic relief. No indication for imaging today based on stable cardiopulmonary exam and hemodynamically stable vital signs.  COVID-19 testing is pending, although patient is out of the window for antiviral therapy.  We will call patient if this is  positive.  Quarantine guidelines discussed.   Patient given ketorolac 30 mg IM in clinic today for body aches.  Promethazine DM and guaifenesin sent to pharmacy for symptomatic relief to be taken as prescribed.   Promethazine DM cough medication may be used as needed only at bedtime due to possible drowsiness side effect (no alcohol, working, or driving while taking this advised).   May use ibuprofen/tylenol over the counter for body aches, fever/chills, and overall discomfort associated with viral illness. Nonpharmacologic interventions for symptom relief provided and after visit summary below.   2.  Elevated blood pressure reading with diagnosis of hypertension Reviewed blood pressure readings from previous visits all of which were elevated above 150s over 90s.  Blood pressure is elevated today upon recheck at 185/105.  Patient was on nifedipine during her pregnancy in 2022 but does not currently take any medications for hypertension and does not have a PCP.  Discussed risks of persistent elevated blood pressure with patient and shared decision making used to opt to start antihypertensive therapy with amlodipine 5 mg once daily.  PCP assistance initiated today.  Advised patient to reduce salt in her diet and increase exercise.  She does not currently have any signs of endorgan damage and there is no indication to send her to the emergency room for further work-up or evaluation at this time.  Neuro exam is without focal deficit.  3.  Bilateral impacted cerumen Ear lavage performed in clinic by nursing staff successfully.  Patient may use Debrox eardrops over-the-counter as needed in the future for cerumen impactions.    Strict ED/urgent care return precautions given.  Patient verbalizes understanding and agreement with plan.  Counseled patient regarding possible side effects and uses of all medications prescribed at today's visit.  Patient verbalizes understanding and agreement with plan.  All  questions answered.  Patient discharged from urgent care in stable condition.        Final Clinical Impressions(s) / UC Diagnoses   Final diagnoses:  Viral URI with cough  Bilateral impacted cerumen  Elevated blood pressure reading with diagnosis of hypertension     Discharge Instructions      You have a viral upper respiratory infection.  COVID-19 testing is pending and we will call you if this is positive in the next 12 to 24 hours.  Stay at home until day 6 of symptoms and wear a mask in public until day 10 of symptoms if you are positive for COVID-19.  Take guaifenesin 1200mg   2 times daily to thin your mucous so that you can cough it up and blow it out of your nose easier. Drink plenty of water while taking this medication so that it works well in your body (at least 8 cups a day).   Take Promethazine DM cough medication to help with your cough at  nighttime so that you are able to sleep. Do not drive, drink alcohol, or go to work while taking this medication since it can make you sleepy. Only take this at nighttime.   You may take Tylenol and ibuprofen over-the-counter as directed for fever, chills, and body aches.  Your next dose of ibuprofen may be in 12 hours at approximately 10 PM tonight since you were given the ketorolac injection in the clinic today.  Take amlodipine 5mg  once daily to reduce your blood pressure. I have sent in a blood pressure cuff to the pharmacy.  I would like for you to check your blood pressure 1 time daily and keep a log of these blood pressures by writing them down in a notebook.  Return to urgent care if you experience any dizziness or weakness.   If you develop any new or worsening symptoms, please return.  If your symptoms are severe, please go to the emergency room.  Follow-up with your primary care provider for further evaluation and management of your symptoms as well as ongoing wellness visits.  I hope you feel better!      ED  Prescriptions     Medication Sig Dispense Auth. Provider   amLODipine (NORVASC) 5 MG tablet Take 1 tablet (5 mg total) by mouth daily. 30 tablet Joella Prince M, FNP   Guaifenesin 1200 MG TB12 Take 1 tablet (1,200 mg total) by mouth in the morning and at bedtime. 14 tablet Talbot Grumbling, FNP   promethazine-dextromethorphan (PROMETHAZINE-DM) 6.25-15 MG/5ML syrup Take 5 mLs by mouth at bedtime as needed for cough. 118 mL Joella Prince M, [place]   Blood Pressure Monitoring (BLOOD PRESSURE CUFF) MISC Take BP once daily. 1 each Talbot Grumbling, FNP      PDMP not reviewed this encounter.   Talbot Grumbling, [place] 02/26/22 1137

## 2022-02-26 NOTE — Discharge Instructions (Addendum)
You have a viral upper respiratory infection.  COVID-19 testing is pending and we will call you if this is positive in the next 12 to 24 hours.  Stay at home until day 6 of symptoms and wear a mask in public until day 10 of symptoms if you are positive for COVID-19.  Take guaifenesin 1200mg   2 times daily to thin your mucous so that you can cough it up and blow it out of your nose easier. Drink plenty of water while taking this medication so that it works well in your body (at least 8 cups a day).   Take Promethazine DM cough medication to help with your cough at nighttime so that you are able to sleep. Do not drive, drink alcohol, or go to work while taking this medication since it can make you sleepy. Only take this at nighttime.   You may take Tylenol and ibuprofen over-the-counter as directed for fever, chills, and body aches.  Your next dose of ibuprofen may be in 12 hours at approximately 10 PM tonight since you were given the ketorolac injection in the clinic today.  Take amlodipine 5mg  once daily to reduce your blood pressure. I have sent in a blood pressure cuff to the pharmacy.  I would like for you to check your blood pressure 1 time daily and keep a log of these blood pressures by writing them down in a notebook.  Return to urgent care if you experience any dizziness or weakness.   If you develop any new or worsening symptoms, please return.  If your symptoms are severe, please go to the emergency room.  Follow-up with your primary care provider for further evaluation and management of your symptoms as well as ongoing wellness visits.  I hope you feel better!

## 2022-02-27 LAB — SARS CORONAVIRUS 2 (TAT 6-24 HRS): SARS Coronavirus 2: NEGATIVE

## 2022-03-01 ENCOUNTER — Encounter: Payer: Self-pay | Admitting: Emergency Medicine

## 2022-04-03 ENCOUNTER — Ambulatory Visit
Admission: RE | Admit: 2022-04-03 | Discharge: 2022-04-03 | Disposition: A | Discharge status: Home / Self Care | Payer: Medicaid Other | Source: Ambulatory Visit | Attending: Physician Assistant | Admitting: Physician Assistant

## 2022-04-03 VITALS — BP 144/91 | HR 87 | Temp 97.9°F | Resp 20

## 2022-04-03 DIAGNOSIS — J020 Streptococcal pharyngitis: Secondary | ICD-10-CM | POA: Diagnosis not present

## 2022-04-03 DIAGNOSIS — J069 Acute upper respiratory infection, unspecified: Secondary | ICD-10-CM

## 2022-04-03 LAB — POCT RAPID STREP A (OFFICE): Rapid Strep A Screen: NEGATIVE

## 2022-04-03 MED ORDER — AZITHROMYCIN 500 MG PO TABS: 500.0000 mg | ORAL_TABLET | Freq: Every day | ORAL | 0 refills | Status: DC

## 2022-04-03 MED ORDER — IBUPROFEN 600 MG PO TABS: 600.0000 mg | ORAL_TABLET | Freq: Four times a day (QID) | ORAL | 0 refills | Status: DC | PRN

## 2022-04-03 NOTE — ED Provider Notes (Signed)
[facility]    CSN: [id number] Arrival date & time: 04/03/22  1133      History   Chief Complaint Chief Complaint  Patient presents with   Sore Throat    Coughing up phlegm still - Entered by patient   Otalgia    HPI Terri Tran is a 31 y.o. female.   31 year old female presents with sore throat and ear congestion.  Patient indicates she has been around her children who have been sick intermittently over the past week.  Patient indicates for the past several days she has been having some upper respiratory congestion with rhinitis and postnasal drip mainly clear.  She relates that she has had sore throat, painful swallowing, which has been progressive over the past couple days.  She indicates she is not getting relief with OTC medications.  Patient indicates she is not having any fever but she is having some chills, body aches, and fatigue.  Patient denies nausea or vomiting.   Sore Throat  Otalgia Associated symptoms: sore throat     Past Medical History:  Diagnosis Date   Abnormal genetic test 10/01/2018   SMA carrier Rec Fob get tested   Anemia affecting first pregnancy 2013   was taking Iron supplements   Anxiety    Phreesia 10/12/2019   Depression 2013   postpartum depression after first delivery; was prescribed Xanax   GBS bacteriuria 09/13/2018   GERD (gastroesophageal reflux disease)    during pregnancy only; takes Tums; helps   Hypertension    Pregnancy induced hypertension     Patient Active Problem List   Diagnosis Date Noted   History of cesarean section 05/09/2020   Cesarean delivery delivered 05/09/2020   History of bilateral tubal ligation 05/09/2020   History of cesarean delivery 03/15/2020   Depression 10/14/2019   Generalized anxiety disorder 10/14/2019   History of GBS bacteriuria 09/13/2018   Gestational hypertension 08/31/2016   Obesity in pregnancy 08/28/2016   BMI 50.0-59.9, adult ([facility]) 08/28/2016    Past Surgical  History:  Procedure Laterality Date   CESAREAN SECTION     CESAREAN SECTION N/A 09/02/2016   Procedure: CESAREAN SECTION;  Surgeon: Lesly Dukes, MD;  Location: [facility];  Service: Obstetrics;  Laterality: N/A;   CESAREAN SECTION N/A 02/02/2019   Procedure: CESAREAN SECTION;  Surgeon: Levie Heritage, DO;  Location: [facility];  Service: Obstetrics;  Laterality: N/A;   CESAREAN SECTION N/A    Phreesia 10/12/2019   CESAREAN SECTION N/A 05/09/2020   Procedure: CESAREAN SECTION;  Surgeon: Venora Maples, MD;  Location: [facility];  Service: Obstetrics;  Laterality: N/A;    OB History     Gravida  4   Para  4   Term  4   Preterm  0   AB  0   Living  4      SAB  0   IAB  0   Ectopic  0   Multiple  0   Live Births  4            Home Medications    Prior to Admission medications   Medication Sig Start Date End Date Taking? Authorizing Provider  azithromycin (ZITHROMAX) 500 MG tablet Take 1 tablet (500 mg total) by mouth daily. 04/03/22  Yes Ellsworth Lennox, PA-C  ibuprofen (ADVIL) 600 MG tablet Take 1 tablet (600 mg total) by mouth every 6 (six) hours as needed. 04/03/22  Yes Ellsworth Lennox, PA-C  amLODipine (  NORVASC) 5 MG tablet Take 1 tablet (5 mg total) by mouth daily. 02/26/22   Carlisle Beers, FNP  Blood Pressure Monitoring (BLOOD PRESSURE CUFF) MISC Take BP once daily. 02/26/22   Carlisle Beers, FNP  cefdinir (OMNICEF) 300 MG capsule Take 1 capsule (300 mg total) by mouth 2 (two) times daily. 10/13/21   Elson Areas, PA-C  Guaifenesin 1200 MG TB12 Take 1 tablet (1,200 mg total) by mouth in the morning and at bedtime. 02/26/22   Carlisle Beers, FNP  metroNIDAZOLE (FLAGYL) 500 MG tablet Take 1 tablet (500 mg total) by mouth 2 (two) times daily. 10/18/21   Lamptey, Britta Mccreedy, MD  NIFEdipine (ADALAT CC) 30 MG 24 hr tablet TAKE 1 TABLET (30 MG TOTAL) BY MOUTH DAILY. 05/11/20 05/11/21  Sheila Oats, MD  prenatal vitamin w/FE, FA  (PRENATAL 1 + 1) 27-1 MG TABS tablet Take 1 tablet by mouth daily at 12 noon. 04/21/20   Pringle Bing, MD  promethazine-dextromethorphan (PROMETHAZINE-DM) 6.25-15 MG/5ML syrup Take 5 mLs by mouth at bedtime as needed for cough. 02/26/22   Carlisle Beers, FNP    Family History Family History  Problem Relation Age of Onset   Hypertension Mother     Social History Social History   Tobacco Use   Smoking status: Some Days    Packs/day: 0.50    Types: Cigarettes    Last attempt to quit: 07/21/2018    Years since quitting: 3.7   Smokeless tobacco: Never  Vaping Use   Vaping Use: Never used  Substance Use Topics   Alcohol use: No    Alcohol/week: 7.0 standard drinks of alcohol    Types: 7 Cans of beer per week   Drug use: No     Allergies   Penicillins   Review of Systems Review of Systems  HENT:  Positive for ear pain (both) and sore throat.      Physical Exam Triage Vital Signs ED Triage Vitals  Enc Vitals Group     BP 04/03/22 1151 (!) 144/91     Pulse Rate 04/03/22 1151 87     Resp 04/03/22 1151 20     Temp 04/03/22 1151 97.9 F (36.6 C)     Temp src --      SpO2 04/03/22 1151 97 %     Weight --      Height --      Head Circumference --      Peak Flow --      Pain Score 04/03/22 1149 10     Pain Loc --      Pain Edu? --      Excl. in [place]? --    No data found.  Updated Vital Signs BP (!) 144/91 Comment: pt reports cannot swallow medications so no bp med this morning  Pulse 87   Temp 97.9 F (36.6 C)   Resp 20   LMP 03/27/2022   SpO2 97%   Visual Acuity Right Eye Distance:   Left Eye Distance:   Bilateral Distance:    Right Eye Near:   Left Eye Near:    Bilateral Near:     Physical Exam Constitutional:      Appearance: She is well-developed.  HENT:     Right Ear: Ear canal normal. Tympanic membrane is injected.     Left Ear: Ear canal normal. Tympanic membrane is injected.     Mouth/Throat:     Mouth: Mucous membranes are moist.  Pharynx: Pharyngeal swelling, oropharyngeal exudate and posterior oropharyngeal erythema present.  Cardiovascular:     Rate and Rhythm: Normal rate and regular rhythm.     Heart sounds: Normal heart sounds.  Pulmonary:     Effort: Pulmonary effort is normal.     Breath sounds: Normal breath sounds and air entry. No wheezing, rhonchi or rales.  Lymphadenopathy:     Cervical: Cervical adenopathy (mild bilat) present.  Neurological:     Mental Status: She is alert.      UC Treatments / Results  Labs (all labs ordered are listed, but only abnormal results are displayed) Labs Reviewed  POCT RAPID STREP A (OFFICE)    EKG   Radiology No results found.  Procedures Procedures (including critical care time)  Medications Ordered in UC Medications - No data to display  Initial Impression / Assessment and Plan / UC Course  I have reviewed the triage vital signs and the nursing notes.  Pertinent labs & imaging results that were available during my care of the patient were reviewed by me and considered in my medical decision making (see chart for details).    Plan: 1.  The acute to strep throat will be treated with the following: A.  Zithromax 500 mg, 1 tablet daily for 5 days to treat a throat infection. B.  Ibuprofen 600 mg every 8 hours to help treat discomfort and pain. 2.  The upper respiratory tract infection be treated with the following: A.  Advised to use Mucinex as needed for cough and congestion. 3.  Advised follow-up with PCP or return to urgent care if symptoms fail to improve. Final Clinical Impressions(s) / UC Diagnoses   Final diagnoses:  Acute streptococcal pharyngitis  Acute upper respiratory infection     Discharge Instructions      Advised to take the Zithromax 500 mg, 1 tablet daily until completed as this will treat strep throat. Advised take ibuprofen 600 mg every 8 hours with food to help treat pain and discomfort. Advised to use salt water  gargles on a regular basis throughout the day to help soothe the throat along with throat lozenges. Advised to follow-up PCP or return to urgent care if symptoms fail to improve.     ED Prescriptions     Medication Sig Dispense Auth. Provider   azithromycin (ZITHROMAX) 500 MG tablet Take 1 tablet (500 mg total) by mouth daily. 5 tablet Nyoka Lint, PA-C   ibuprofen (ADVIL) 600 MG tablet Take 1 tablet (600 mg total) by mouth every 6 (six) hours as needed. 30 tablet Nyoka Lint, PA-C      PDMP not reviewed this encounter.   Nyoka Lint, PA-C 04/03/22 1222

## 2022-04-03 NOTE — Discharge Instructions (Addendum)
Advised to take the Zithromax 500 mg, 1 tablet daily until completed as this will treat strep throat. Advised take ibuprofen 600 mg every 8 hours with food to help treat pain and discomfort. Advised to use salt water gargles on a regular basis throughout the day to help soothe the throat along with throat lozenges. Advised to follow-up PCP or return to urgent care if symptoms fail to improve.

## 2022-04-03 NOTE — ED Triage Notes (Signed)
Pt presents to [facility] with co of sore throat and swelling since Saturday, pt reports her children are sick from day care pt reports pain in right ear as well. Pt report no otc medications

## 2022-05-08 ENCOUNTER — Ambulatory Visit
Admission: RE | Admit: 2022-05-08 | Discharge: 2022-05-08 | Disposition: A | Discharge status: Home / Self Care | Payer: Medicaid Other | Source: Ambulatory Visit | Attending: Physician Assistant | Admitting: Physician Assistant

## 2022-05-08 VITALS — BP 151/97 | HR 74 | Temp 98.3°F | Resp 20

## 2022-05-08 DIAGNOSIS — Z113 Encounter for screening for infections with a predominantly sexual mode of transmission: Secondary | ICD-10-CM | POA: Diagnosis not present

## 2022-05-08 NOTE — ED Triage Notes (Signed)
Pt presents for Std testing with no known symptoms.

## 2022-05-08 NOTE — ED Provider Notes (Signed)
[facility]    CSN: [id number] Arrival date & time: 05/08/22  0935      History   Chief Complaint Chief Complaint  Patient presents with   SEXUALLY TRANSMITTED DISEASE    Just would like to be checked - Entered by patient    HPI Terri Tran is a 31 y.o. female.   Patient here today for STD screening.  She denies any symptoms.  She has not had any known exposure.  She would like blood work as well.  The history is provided by the patient.    Past Medical History:  Diagnosis Date   Abnormal genetic test 10/01/2018   SMA carrier Rec Fob get tested   Anemia affecting first pregnancy 2013   was taking Iron supplements   Anxiety    Phreesia 10/12/2019   Depression 2013   postpartum depression after first delivery; was prescribed Xanax   GBS bacteriuria 09/13/2018   GERD (gastroesophageal reflux disease)    during pregnancy only; takes Tums; helps   Hypertension    Pregnancy induced hypertension     Patient Active Problem List   Diagnosis Date Noted   History of cesarean section 05/09/2020   Cesarean delivery delivered 05/09/2020   History of bilateral tubal ligation 05/09/2020   History of cesarean delivery 03/15/2020   Depression 10/14/2019   Generalized anxiety disorder 10/14/2019   History of GBS bacteriuria 09/13/2018   Gestational hypertension 08/31/2016   Obesity in pregnancy 08/28/2016   BMI 50.0-59.9, adult ([place]) 08/28/2016    Past Surgical History:  Procedure Laterality Date   CESAREAN SECTION     CESAREAN SECTION N/A 09/02/2016   Procedure: CESAREAN SECTION;  Surgeon: Guss Bunde, MD;  Location: [place];  Service: Obstetrics;  Laterality: N/A;   CESAREAN SECTION N/A 02/02/2019   Procedure: CESAREAN SECTION;  Surgeon: Truett Mainland, DO;  Location: [place] [facility];  Service: Obstetrics;  Laterality: N/A;   CESAREAN SECTION N/A    Phreesia 10/12/2019   CESAREAN SECTION N/A 05/09/2020   Procedure: CESAREAN SECTION;  Surgeon:  Clarnce Flock, MD;  Location: [facility];  Service: Obstetrics;  Laterality: N/A;    OB History     Gravida  4   Para  4   Term  4   Preterm  0   AB  0   Living  4      SAB  0   IAB  0   Ectopic  0   Multiple  0   Live Births  4            Home Medications    Prior to Admission medications   Medication Sig Start Date End Date Taking? Authorizing Provider  amLODipine (NORVASC) 5 MG tablet Take 1 tablet (5 mg total) by mouth daily. 02/26/22   Talbot Grumbling, FNP  azithromycin (ZITHROMAX) 500 MG tablet Take 1 tablet (500 mg total) by mouth daily. 04/03/22   Nyoka Lint, PA-C  Blood Pressure Monitoring (BLOOD PRESSURE CUFF) MISC Take BP once daily. 02/26/22   Talbot Grumbling, FNP  cefdinir (OMNICEF) 300 MG capsule Take 1 capsule (300 mg total) by mouth 2 (two) times daily. 10/13/21   Fransico Meadow, PA-C  Guaifenesin 1200 MG TB12 Take 1 tablet (1,200 mg total) by mouth in the morning and at bedtime. 02/26/22   Talbot Grumbling, FNP  ibuprofen (ADVIL) 600 MG tablet Take 1 tablet (600 mg total) by mouth every 6 (six) hours  as needed. 04/03/22   Nyoka Lint, PA-C  metroNIDAZOLE (FLAGYL) 500 MG tablet Take 1 tablet (500 mg total) by mouth 2 (two) times daily. 10/18/21   Lamptey, Myrene Galas, MD  NIFEdipine (ADALAT CC) 30 MG 24 hr tablet TAKE 1 TABLET (30 MG TOTAL) BY MOUTH DAILY. 05/11/20 05/11/21  Randa Ngo, MD  prenatal vitamin w/FE, FA (PRENATAL 1 + 1) 27-1 MG TABS tablet Take 1 tablet by mouth daily at 12 noon. 04/21/20   Aletha Halim, MD  promethazine-dextromethorphan (PROMETHAZINE-DM) 6.25-15 MG/5ML syrup Take 5 mLs by mouth at bedtime as needed for cough. 02/26/22   Talbot Grumbling, FNP    Family History Family History  Problem Relation Age of Onset   Hypertension Mother     Social History Social History   Tobacco Use   Smoking status: Some Days    Packs/day: 0.50    Types: Cigarettes    Last attempt to quit: 07/21/2018     Years since quitting: 3.8   Smokeless tobacco: Never  Vaping Use   Vaping Use: Never used  Substance Use Topics   Alcohol use: No    Alcohol/week: 7.0 standard drinks of alcohol    Types: 7 Cans of beer per week   Drug use: No     Allergies   Penicillins   Review of Systems Review of Systems  Constitutional:  Negative for chills and fever.  Eyes:  Negative for discharge and redness.  Respiratory:  Negative for shortness of breath.   Gastrointestinal:  Negative for abdominal pain, nausea and vomiting.  Genitourinary:  Negative for dysuria, genital sores and vaginal discharge.  Musculoskeletal:  Negative for back pain.     Physical Exam Triage Vital Signs ED Triage Vitals  Enc Vitals Group     BP 05/08/22 1001 (!) 151/97     Pulse Rate 05/08/22 1001 74     Resp 05/08/22 1001 20     Temp 05/08/22 1001 98.3 F (36.8 C)     Temp Source 05/08/22 1001 Oral     SpO2 05/08/22 1001 98 %     Weight --      Height --      Head Circumference --      Peak Flow --      Pain Score 05/08/22 1002 0     Pain Loc --      Pain Edu? --      Excl. in [place]? --    No data found.  Updated Vital Signs BP (!) 151/97 (BP Location: Left Arm)   Pulse 74   Temp 98.3 F (36.8 C) (Oral)   Resp 20   LMP 04/22/2022   SpO2 98%      Physical Exam Vitals and nursing note reviewed.  Constitutional:      General: She is not in acute distress.    Appearance: Normal appearance. She is not ill-appearing.  HENT:     Head: Normocephalic and atraumatic.  Eyes:     Conjunctiva/sclera: Conjunctivae normal.  Cardiovascular:     Rate and Rhythm: Normal rate.  Pulmonary:     Effort: Pulmonary effort is normal.  Neurological:     Mental Status: She is alert.  Psychiatric:        Mood and Affect: Mood normal.        Behavior: Behavior normal.        Thought Content: Thought content normal.      UC Treatments / Results  Labs (all labs ordered  are listed, but only abnormal results are  displayed) Labs Reviewed  HIV ANTIBODY (ROUTINE TESTING W REFLEX)  HEPATITIS PANEL, ACUTE  RPR  CERVICOVAGINAL ANCILLARY ONLY    EKG   Radiology No results found.  Procedures Procedures (including critical care time)  Medications Ordered in UC Medications - No data to display  Initial Impression / Assessment and Plan / UC Course  I have reviewed the triage vital signs and the nursing notes.  Pertinent labs & imaging results that were available during my care of the patient were reviewed by me and considered in my medical decision making (see chart for details).     STD screening ordered as requested.  Will await results further recommendation.     Final Clinical Impressions(s) / UC Diagnoses   Final diagnoses:  Screening for STD (sexually transmitted disease)   Discharge Instructions   None    ED Prescriptions   None    PDMP not reviewed this encounter.   Tomi Bamberger, PA-C 05/08/22 1018

## 2022-05-09 ENCOUNTER — Telehealth (HOSPITAL_COMMUNITY): Payer: Self-pay | Admitting: Emergency Medicine

## 2022-05-09 LAB — CERVICOVAGINAL ANCILLARY ONLY
Bacterial Vaginitis (gardnerella): POSITIVE — AB
Candida Glabrata: NEGATIVE
Candida Vaginitis: NEGATIVE
Chlamydia: NEGATIVE
Comment: NEGATIVE
Comment: NEGATIVE
Comment: NEGATIVE
Comment: NEGATIVE
Comment: NEGATIVE
Comment: NORMAL
Neisseria Gonorrhea: NEGATIVE
Trichomonas: NEGATIVE

## 2022-05-09 LAB — HIV ANTIBODY (ROUTINE TESTING W REFLEX): HIV Screen 4th Generation wRfx: NONREACTIVE

## 2022-05-09 LAB — RPR: RPR Ser Ql: NONREACTIVE

## 2022-05-09 MED ORDER — METRONIDAZOLE 0.75 % VA GEL: 1.0000 | Freq: Every day | VAGINAL | 0 refills | Status: AC

## 2022-06-27 ENCOUNTER — Ambulatory Visit
Admission: RE | Admit: 2022-06-27 | Discharge: 2022-06-27 | Disposition: A | Discharge status: Home / Self Care | Payer: Medicaid Other | Source: Ambulatory Visit | Attending: Physician Assistant | Admitting: Physician Assistant

## 2022-06-27 VITALS — BP 152/94 | HR 89 | Temp 98.5°F | Resp 18

## 2022-06-27 DIAGNOSIS — J069 Acute upper respiratory infection, unspecified: Secondary | ICD-10-CM

## 2022-06-27 DIAGNOSIS — Z1152 Encounter for screening for COVID-19: Secondary | ICD-10-CM

## 2022-06-27 DIAGNOSIS — I10 Essential (primary) hypertension: Secondary | ICD-10-CM | POA: Diagnosis not present

## 2022-06-27 DIAGNOSIS — H65191 Other acute nonsuppurative otitis media, right ear: Secondary | ICD-10-CM | POA: Diagnosis not present

## 2022-06-27 MED ORDER — AMLODIPINE BESYLATE 5 MG PO TABS
5.0000 mg | ORAL_TABLET | Freq: Every day | ORAL | 1 refills | Status: DC
Start: 2022-06-27 — End: 2022-12-28

## 2022-06-27 MED ORDER — DOXYCYCLINE HYCLATE 100 MG PO CAPS: 100.0000 mg | ORAL_CAPSULE | Freq: Two times a day (BID) | ORAL | 0 refills | Status: DC

## 2022-06-27 NOTE — ED Notes (Signed)
Pt also requesting refill on BP medication until she sees her primary

## 2022-06-27 NOTE — ED Provider Notes (Signed)
[facility]    CSN: [id number] Arrival date & time: 06/27/22  1430      History   Chief Complaint Chief Complaint  Patient presents with   URI    HPI Terri Tran is a 32 y.o. female.   Patient here today for evaluation of cough, sore throat and right ear pain that started 2 days ago.  She has not had any fever.  She denies any vomiting or diarrhea.  She has taken over-the-counter medication without resolution.  She also requests refill of her blood pressure medication. She has not had any side effects with medication in the past. She has not had any recent chest pain or shortness of breath. She denies any headaches.   The history is provided by the patient.  URI Presenting symptoms: congestion, cough, ear pain and sore throat   Presenting symptoms: no fever   Associated symptoms: no headaches and no wheezing     Past Medical History:  Diagnosis Date   Abnormal genetic test 10/01/2018   SMA carrier Rec Fob get tested   Anemia affecting first pregnancy 2013   was taking Iron supplements   Anxiety    Phreesia 10/12/2019   Depression 2013   postpartum depression after first delivery; was prescribed Xanax   GBS bacteriuria 09/13/2018   GERD (gastroesophageal reflux disease)    during pregnancy only; takes Tums; helps   Hypertension    Pregnancy induced hypertension     Patient Active Problem List   Diagnosis Date Noted   History of cesarean section 05/09/2020   Cesarean delivery delivered 05/09/2020   History of bilateral tubal ligation 05/09/2020   History of cesarean delivery 03/15/2020   Depression 10/14/2019   Generalized anxiety disorder 10/14/2019   History of GBS bacteriuria 09/13/2018   Gestational hypertension 08/31/2016   Obesity in pregnancy 08/28/2016   BMI 50.0-59.9, adult ([place]) 08/28/2016    Past Surgical History:  Procedure Laterality Date   CESAREAN SECTION     CESAREAN SECTION N/A 09/02/2016   Procedure: CESAREAN SECTION;   Surgeon: Guss Bunde, MD;  Location: [place];  Service: Obstetrics;  Laterality: N/A;   CESAREAN SECTION N/A 02/02/2019   Procedure: CESAREAN SECTION;  Surgeon: Truett Mainland, DO;  Location: [place] [facility];  Service: Obstetrics;  Laterality: N/A;   CESAREAN SECTION N/A    Phreesia 10/12/2019   CESAREAN SECTION N/A 05/09/2020   Procedure: CESAREAN SECTION;  Surgeon: Clarnce Flock, MD;  [facility]: [facility];  Service: Obstetrics;  Laterality: N/A;    OB History     Gravida  4   Para  4   Term  4   Preterm  0   AB  0   Living  4      SAB  0   IAB  0   Ectopic  0   Multiple  0   Live Births  4            Home Medications    Prior to Admission medications   Medication Sig Start Date End Date Taking? Authorizing Provider  doxycycline (VIBRAMYCIN) 100 MG capsule Take 1 capsule (100 mg total) by mouth 2 (two) times daily. 06/27/22  Yes Francene Finders, PA-C  amLODipine (NORVASC) 5 MG tablet Take 1 tablet (5 mg total) by mouth daily. 06/27/22   Francene Finders, PA-C  Blood Pressure Monitoring (BLOOD PRESSURE CUFF) MISC Take BP once daily. 02/26/22   Talbot Grumbling, FNP  Guaifenesin 1200 MG TB12 Take 1 tablet (1,200 mg total) by mouth in the morning and at bedtime. 02/26/22   Talbot Grumbling, FNP  ibuprofen (ADVIL) 600 MG tablet Take 1 tablet (600 mg total) by mouth every 6 (six) hours as needed. 04/03/22   Nyoka Lint, PA-C  prenatal vitamin w/FE, FA (PRENATAL 1 + 1) 27-1 MG TABS tablet Take 1 tablet by mouth daily at 12 noon. 04/21/20   Aletha Halim, MD  promethazine-dextromethorphan (PROMETHAZINE-DM) 6.25-15 MG/5ML syrup Take 5 mLs by mouth at bedtime as needed for cough. 02/26/22   Talbot Grumbling, FNP    Family History Family History  Problem Relation Age of Onset   Hypertension Mother     Social History Social History   Tobacco Use   Smoking status: Some Days    Packs/day: 0.50    Types: Cigarettes    Last attempt to  quit: 07/21/2018    Years since quitting: 3.9   Smokeless tobacco: Never  Vaping Use   Vaping Use: Never used  Substance Use Topics   Alcohol use: No    Alcohol/week: 7.0 standard drinks of alcohol    Types: 7 Cans of beer per week   Drug use: No     Allergies   Penicillins   Review of Systems Review of Systems  Constitutional:  Negative for chills and fever.  HENT:  Positive for congestion, ear pain and sore throat.   Eyes:  Negative for discharge and redness.  Respiratory:  Positive for cough. Negative for shortness of breath and wheezing.   Cardiovascular:  Negative for chest pain.  Gastrointestinal:  Negative for abdominal pain, diarrhea and vomiting.  Neurological:  Negative for headaches.     Physical Exam Triage Vital Signs ED Triage Vitals  Enc Vitals Group     BP 06/27/22 1459 (S) (!) 152/94     Pulse Rate 06/27/22 1459 89     Resp 06/27/22 1459 18     Temp 06/27/22 1459 98.5 F (36.9 C)     Temp Source 06/27/22 1459 Oral     SpO2 06/27/22 1459 97 %     Weight --      Height --      Head Circumference --      Peak Flow --      Pain Score 06/27/22 1458 6     Pain Loc --      Pain Edu? --      Excl. in [place]? --    No data found.  Updated Vital Signs BP (S) (!) 152/94 (BP Location: Right Arm) Comment: ran out of BP medication 2 weeks ago  Pulse 89   Temp 98.5 F (36.9 C) (Oral)   Resp 18   LMP 06/19/2022   SpO2 97%      Physical Exam Vitals and nursing note reviewed.  Constitutional:      General: She is not in acute distress.    Appearance: Normal appearance. She is not ill-appearing.  HENT:     Head: Normocephalic and atraumatic.     Left Ear: Tympanic membrane normal.     Ears:     Comments: Right TM injected    Nose: Congestion present.     Mouth/Throat:     Mouth: Mucous membranes are moist.     Pharynx: No oropharyngeal exudate or posterior oropharyngeal erythema.  Eyes:     Conjunctiva/sclera: Conjunctivae normal.  Cardiovascular:      Rate and Rhythm: Normal rate and regular  rhythm.     Heart sounds: Normal heart sounds. No murmur heard. Pulmonary:     Effort: Pulmonary effort is normal. No respiratory distress.     Breath sounds: Normal breath sounds. No wheezing, rhonchi or rales.  Skin:    General: Skin is warm and dry.  Neurological:     Mental Status: She is alert.  Psychiatric:        Mood and Affect: Mood normal.        Thought Content: Thought content normal.      UC Treatments / Results  Labs (all labs ordered are listed, but only abnormal results are displayed) Labs Reviewed  SARS CORONAVIRUS 2 (TAT 6-24 HRS)    EKG   Radiology No results found.  Procedures Procedures (including critical care time)  Medications Ordered in UC Medications - No data to display  Initial Impression / Assessment and Plan / UC Course  I have reviewed the triage vital signs and the nursing notes.  Pertinent labs & imaging results that were available during my care of the patient were reviewed by me and considered in my medical decision making (see chart for details).    Suspect viral etiology of upper respiratory symptoms. Given appearance of right TM will treat to cover otitis media with antibiotics. Amlodipine refilled as requested. Patient to see primary care in April. Encouraged sooner follow up with any further concerns.   Final Clinical Impressions(s) / UC Diagnoses   Final diagnoses:  Acute upper respiratory infection  Encounter for screening for COVID-19  Other acute nonsuppurative otitis media of right ear, recurrence not specified  Essential hypertension   Discharge Instructions   None    ED Prescriptions     Medication Sig Dispense Auth. Provider   amLODipine (NORVASC) 5 MG tablet Take 1 tablet (5 mg total) by mouth daily. 30 tablet Ewell Poe F, PA-C   doxycycline (VIBRAMYCIN) 100 MG capsule Take 1 capsule (100 mg total) by mouth 2 (two) times daily. 20 capsule Francene Finders,  PA-C      PDMP not reviewed this encounter.   Francene Finders, PA-C 06/27/22 [phone]

## 2022-06-27 NOTE — ED Triage Notes (Signed)
Pt presents with productive cough, sore throat and right ear pain X 2 days.

## 2022-06-28 LAB — SARS CORONAVIRUS 2 (TAT 6-24 HRS): SARS Coronavirus 2: NEGATIVE

## 2022-07-11 ENCOUNTER — Ambulatory Visit: Payer: Medicaid Other | Admitting: Family Medicine

## 2022-08-23 ENCOUNTER — Ambulatory Visit: Payer: Medicaid Other | Admitting: Internal Medicine

## 2022-09-18 ENCOUNTER — Ambulatory Visit: Payer: Medicaid Other | Admitting: Family Medicine

## 2022-10-01 ENCOUNTER — Ambulatory Visit
Admission: RE | Admit: 2022-10-01 | Discharge: 2022-10-01 | Disposition: A | Discharge status: Home / Self Care | Payer: Medicaid Other | Source: Ambulatory Visit | Attending: Family Medicine | Admitting: Family Medicine

## 2022-10-01 VITALS — BP 153/90 | HR 66 | Temp 98.2°F | Resp 16

## 2022-10-01 DIAGNOSIS — Z1152 Encounter for screening for COVID-19: Secondary | ICD-10-CM | POA: Insufficient documentation

## 2022-10-01 DIAGNOSIS — H6692 Otitis media, unspecified, left ear: Secondary | ICD-10-CM | POA: Insufficient documentation

## 2022-10-01 DIAGNOSIS — J069 Acute upper respiratory infection, unspecified: Secondary | ICD-10-CM | POA: Insufficient documentation

## 2022-10-01 MED ORDER — PROMETHAZINE-DM 6.25-15 MG/5ML PO SYRP: 5.0000 mL | ORAL_SOLUTION | Freq: Four times a day (QID) | ORAL | 0 refills | Status: DC | PRN

## 2022-10-01 MED ORDER — DOXYCYCLINE HYCLATE 100 MG PO CAPS: 100.0000 mg | ORAL_CAPSULE | Freq: Two times a day (BID) | ORAL | 0 refills | Status: AC

## 2022-10-01 NOTE — ED Triage Notes (Signed)
Pt c/o cough, chills, hot sweats   Onset ~ thurs

## 2022-10-01 NOTE — ED Provider Notes (Addendum)
[facility]    CSN: [id number] Arrival date & time: 10/01/22  1355      History   Chief Complaint Chief Complaint  Patient presents with   Cough    I have sweating and a serve cough. Occasionally I feel chills. But the cough has my insides sore and it's a bad cough - Entered by patient    HPI Terri Tran is a 32 y.o. female.    Cough  Here for cough and nasal congestion.  Symptoms began on May night.  No fever or chills.  She has not had any vomiting or diarrhea.  She has had a little bit of throat pain when she coughs.    No history of asthma her left ear has been hurting    Past Medical History:  Diagnosis Date   Abnormal genetic test 10/01/2018   SMA carrier Rec Fob get tested   Anemia affecting first pregnancy 2013   was taking Iron supplements   Anxiety    Phreesia 10/12/2019   Depression 2013   postpartum depression after first delivery; was prescribed Xanax   GBS bacteriuria 09/13/2018   GERD (gastroesophageal reflux disease)    during pregnancy only; takes Tums; helps   Hypertension    Pregnancy induced hypertension     Patient Active Problem List   Diagnosis Date Noted   History of cesarean section 05/09/2020   Cesarean delivery delivered 05/09/2020   History of bilateral tubal ligation 05/09/2020   History of cesarean delivery 03/15/2020   Depression 10/14/2019   Generalized anxiety disorder 10/14/2019   History of GBS bacteriuria 09/13/2018   Gestational hypertension 08/31/2016   Obesity in pregnancy 08/28/2016   BMI 50.0-59.9, adult ([facility]) 08/28/2016    Past Surgical History:  Procedure Laterality Date   CESAREAN SECTION     CESAREAN SECTION N/A 09/02/2016   Procedure: CESAREAN SECTION;  Surgeon: Lesly Dukes, MD;  Location: [facility];  Service: Obstetrics;  Laterality: N/A;   CESAREAN SECTION N/A 02/02/2019   Procedure: CESAREAN SECTION;  Surgeon: Levie Heritage, DO;  Location: [facility];  Service: Obstetrics;   Laterality: N/A;   CESAREAN SECTION N/A    Phreesia 10/12/2019   CESAREAN SECTION N/A 05/09/2020   Procedure: CESAREAN SECTION;  Surgeon: Venora Maples, MD;  Location: [facility];  Service: Obstetrics;  Laterality: N/A;    OB History     Gravida  4   Para  4   Term  4   Preterm  0   AB  0   Living  4      SAB  0   IAB  0   Ectopic  0   Multiple  0   Live Births  4            Home Medications    Prior to Admission medications   Medication Sig Start Date End Date Taking? Authorizing Provider  doxycycline (VIBRAMYCIN) 100 MG capsule Take 1 capsule (100 mg total) by mouth 2 (two) times daily for 7 days. 10/01/22 10/08/22 Yes Zenia Resides, MD  promethazine-dextromethorphan (PROMETHAZINE-DM) 6.25-15 MG/5ML syrup Take 5 mLs by mouth 4 (four) times daily as needed for cough. 10/01/22  Yes Zenia Resides, MD  amLODipine (NORVASC) 5 MG tablet Take 1 tablet (5 mg total) by mouth daily. 06/27/22   Tomi Bamberger, PA-C  Blood Pressure Monitoring (BLOOD PRESSURE CUFF) MISC Take BP once daily. 02/26/22   Carlisle Beers, FNP  Guaifenesin 1200 MG TB12 Take 1 tablet (1,200 mg total) by mouth in the morning and at bedtime. 02/26/22   Carlisle Beers, FNP  ibuprofen (ADVIL) 600 MG tablet Take 1 tablet (600 mg total) by mouth every 6 (six) hours as needed. 04/03/22   Ellsworth Lennox, PA-C  prenatal vitamin w/FE, FA (PRENATAL 1 + 1) 27-1 MG TABS tablet Take 1 tablet by mouth daily at 12 noon. 04/21/20   [place] Bing, MD    Family History Family History  Problem Relation Age of Onset   Hypertension Mother     Social History Social History   Tobacco Use   Smoking status: Some Days    Packs/day: .5    Types: Cigarettes    Last attempt to quit: 07/21/2018    Years since quitting: 4.2   Smokeless tobacco: Never  Vaping Use   Vaping Use: Never used  Substance Use Topics   Alcohol use: No    Alcohol/week: 7.0 standard drinks of alcohol    Types: 7 Cans  of beer per week   Drug use: No     Allergies   Penicillins   Review of Systems Review of Systems  Respiratory:  Positive for cough.      Physical Exam Triage Vital Signs ED Triage Vitals [10/01/22 1418]  Enc Vitals Group     BP (!) 153/90     Pulse Rate 66     Resp 16     Temp 98.2 F (36.8 C)     Temp Source Oral     SpO2 98 %     Weight      Height      Head Circumference      Peak Flow      Pain Score 10     Pain Loc      Pain Edu?      Excl. in [place]?    No data found.  Updated Vital Signs BP (!) 153/90 (BP Location: Right Arm)   Pulse 66   Temp 98.2 F (36.8 C) (Oral)   Resp 16   SpO2 98%   Visual Acuity Right Eye Distance:   Left Eye Distance:   Bilateral Distance:    Right Eye Near:   Left Eye Near:    Bilateral Near:     Physical Exam Vitals reviewed.  Constitutional:      General: She is not in acute distress.    Appearance: She is not toxic-appearing.  HENT:     Right Ear: Tympanic membrane and ear canal normal.     Left Ear: Ear canal normal.     Ears:     Comments: The left tympanic membrane is dull with altered landmarks.    Nose: Nose normal.     Mouth/Throat:     Mouth: Mucous membranes are moist.     Pharynx: No oropharyngeal exudate or posterior oropharyngeal erythema.  Eyes:     Extraocular Movements: Extraocular movements intact.     Conjunctiva/sclera: Conjunctivae normal.     Pupils: Pupils are equal, round, and reactive to light.  Cardiovascular:     Rate and Rhythm: Normal rate and regular rhythm.     Heart sounds: No murmur heard. Pulmonary:     Effort: Pulmonary effort is normal. No respiratory distress.     Breath sounds: No stridor. No wheezing, rhonchi or rales.  Musculoskeletal:     Cervical back: Neck supple.  Lymphadenopathy:     Cervical: No cervical adenopathy.  Skin:  Capillary Refill: Capillary refill takes less than 2 seconds.     Coloration: Skin is not jaundiced or pale.  Neurological:      General: No focal deficit present.     Mental Status: She is alert and oriented to person, place, and time.  Psychiatric:        Behavior: Behavior normal.      UC Treatments / Results  Labs (all labs ordered are listed, but only abnormal results are displayed) Labs Reviewed  SARS CORONAVIRUS 2 (TAT 6-24 HRS)    EKG   Radiology No results found.  Procedures Procedures (including critical care time)  Medications Ordered in UC Medications - No data to display  Initial Impression / Assessment and Plan / UC Course  I have reviewed the triage vital signs and the nursing notes.  Pertinent labs & imaging results that were available during my care of the patient were reviewed by me and considered in my medical decision making (see chart for details).        I will treat the ear infection with doxycycline.  Phenergan with dextromethorphan is sent in for the cough.  We will swab her for COVID, and if positive she will know she needs to quarantine.  She is allergic to penicillin   Final Clinical Impressions(s) / UC Diagnoses   Final diagnoses:  Left otitis media, unspecified otitis media type  Viral URI     Discharge Instructions      Take doxycycline 100 mg --1 capsule 2 times daily for 7 days  Take Phenergan with dextromethorphan syrup--5 mL or 1 teaspoon every 6 hours as needed for cough   You have been swabbed for COVID, and the test will result in the next 24 hours. Our staff will call you if positive. If the COVID test is positive, you should quarantine until you are fever free for 24 hours and you are starting to feel better, and then take added precautions for the next 5 days, such as physical distancing/wearing a mask and good hand hygiene/washing.      ED Prescriptions     Medication Sig Dispense Auth. Provider   doxycycline (VIBRAMYCIN) 100 MG capsule Take 1 capsule (100 mg total) by mouth 2 (two) times daily for 7 days. 14 capsule Zenia Resides, MD   promethazine-dextromethorphan (PROMETHAZINE-DM) 6.25-15 MG/5ML syrup Take 5 mLs by mouth 4 (four) times daily as needed for cough. 118 mL Zenia Resides, MD      PDMP not reviewed this encounter.   Zenia Resides, MD 10/01/22 1429    Zenia Resides, MD 10/01/22 1430

## 2022-10-01 NOTE — Discharge Instructions (Addendum)
Take doxycycline 100 mg --1 capsule 2 times daily for 7 days  Take Phenergan with dextromethorphan syrup--5 mL or 1 teaspoon every 6 hours as needed for cough   You have been swabbed for COVID, and the test will result in the next 24 hours. Our staff will call you if positive. If the COVID test is positive, you should quarantine until you are fever free for 24 hours and you are starting to feel better, and then take added precautions for the next 5 days, such as physical distancing/wearing a mask and good hand hygiene/washing.

## 2022-10-02 LAB — SARS CORONAVIRUS 2 (TAT 6-24 HRS): SARS Coronavirus 2: NEGATIVE

## 2022-12-09 IMAGING — US US MFM FETAL BPP W/O NON-STRESS
1 series · 14 of 28 positions shown · non-contrast
Comparison: none

[Series 1: us mfm fetal bpp w/o non-stress · 43 acquisitions, 14 frames shown]
[im 2/43]
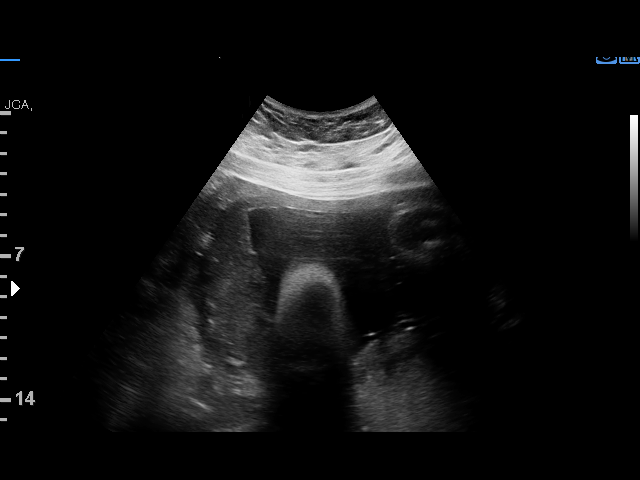
[im 5/43]
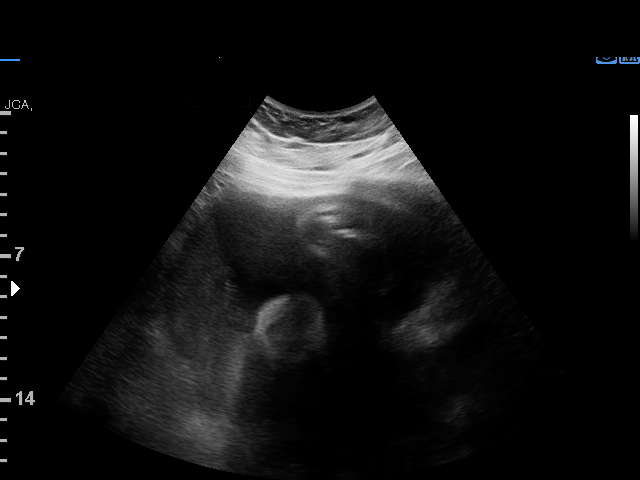
[im 8/43]
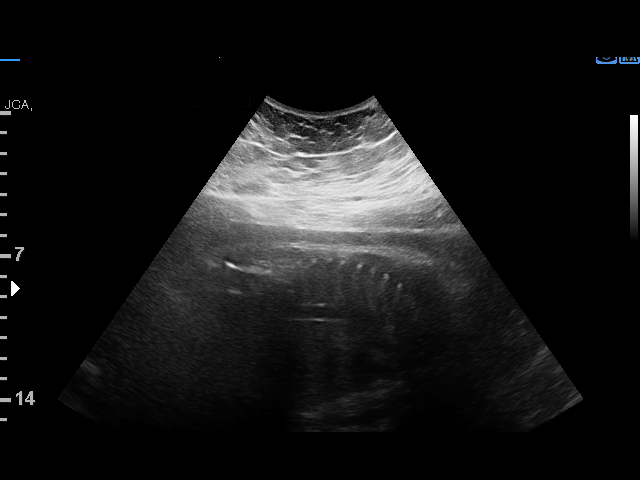
[im 11/43]
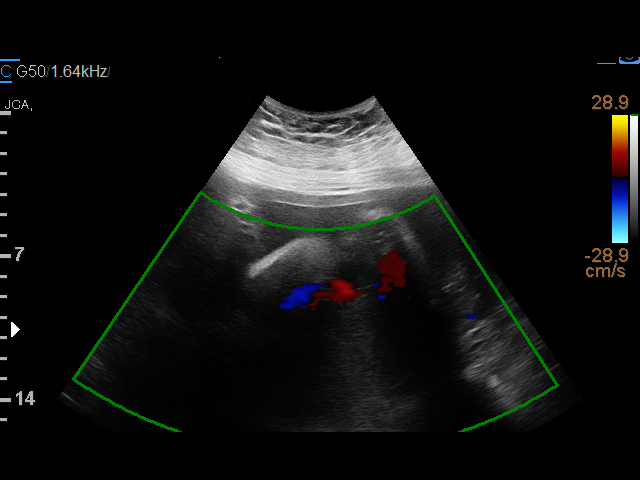
[im 15/43]
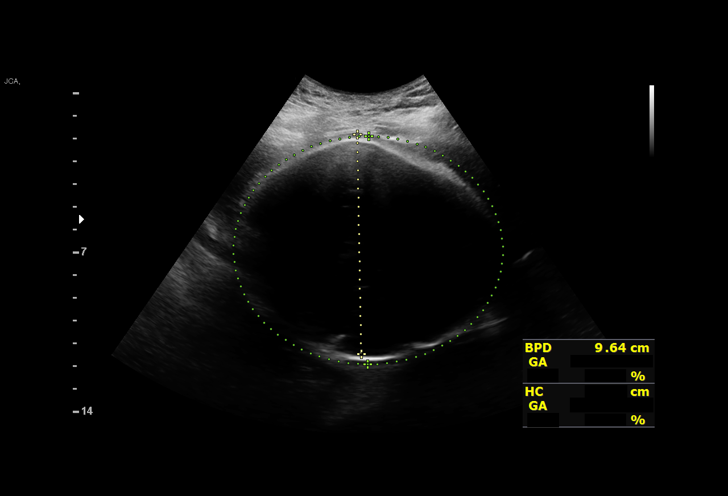
[im 18/43]
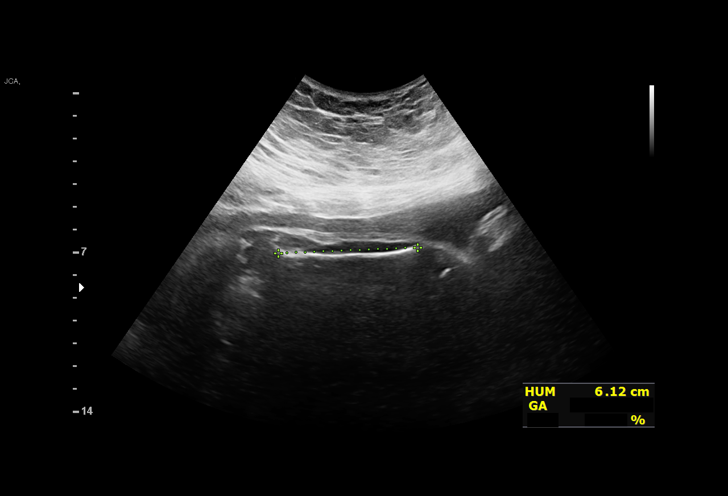
[im 21/43]
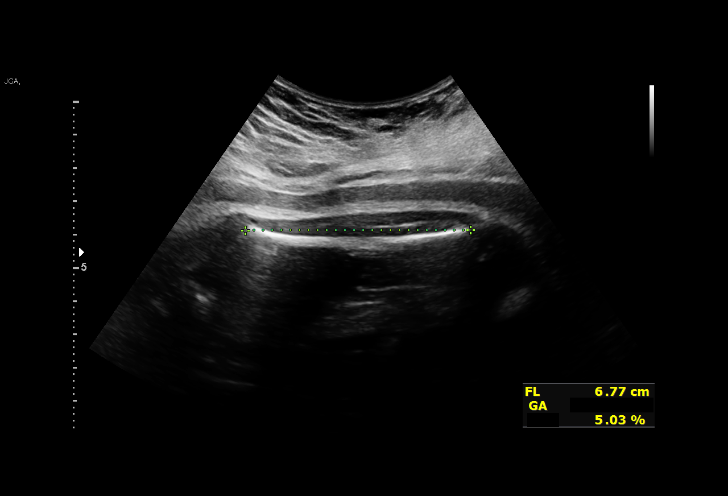
[im 24/43]
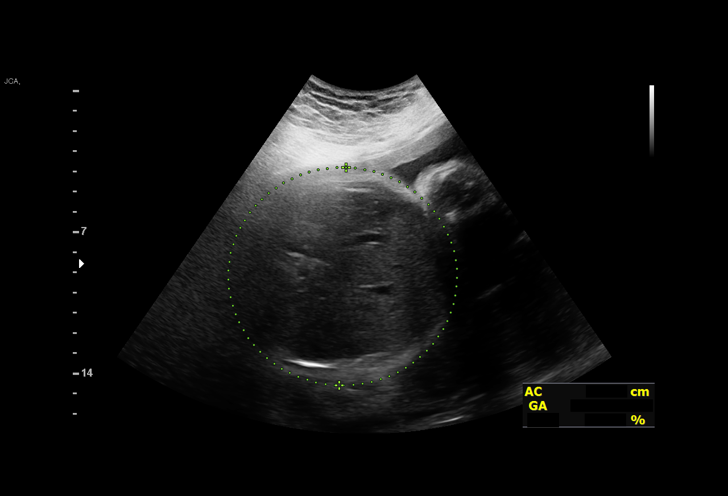
[im 27/43]
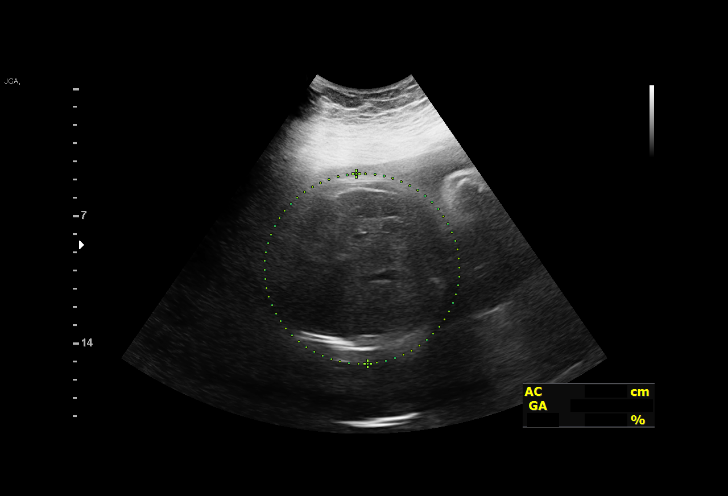
[im 30/43]
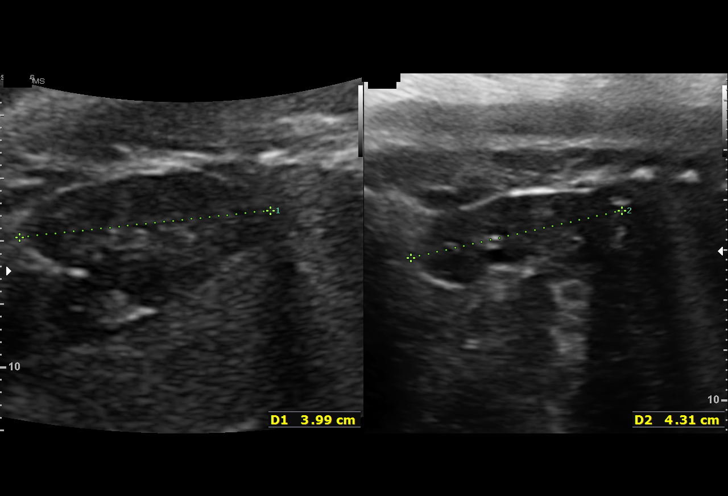
[im 33/43]
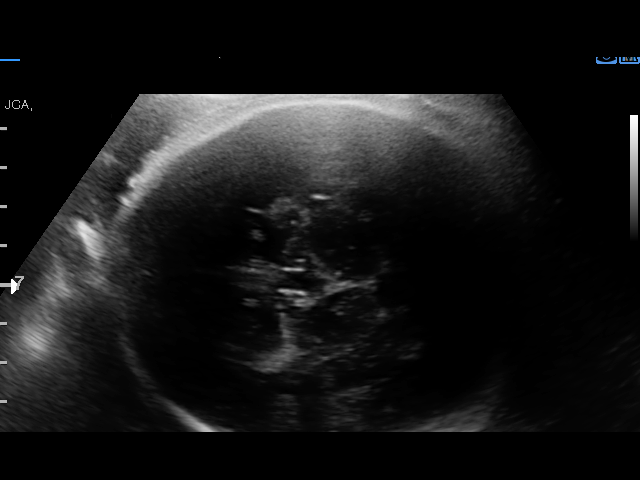
[im 36/43]
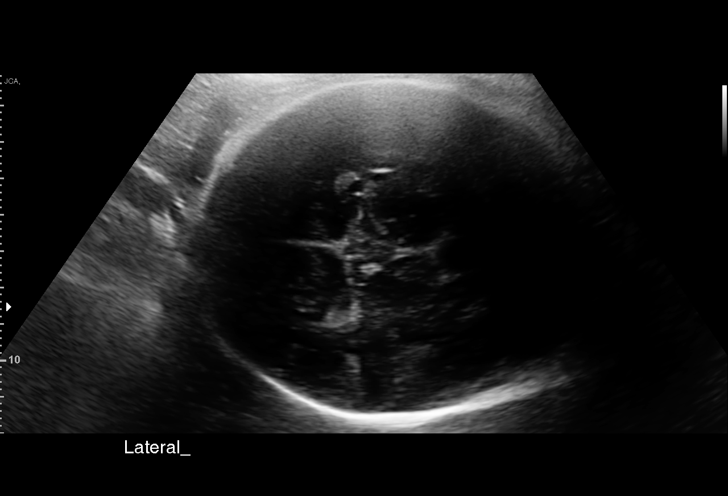
[im 39/43]
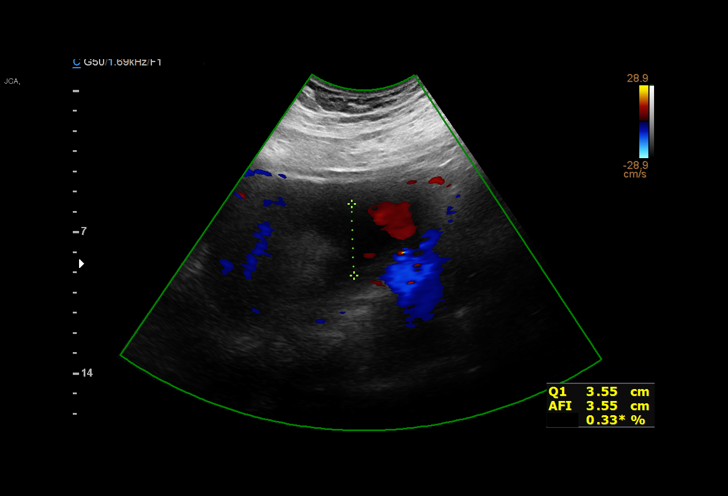
[im 43/43]
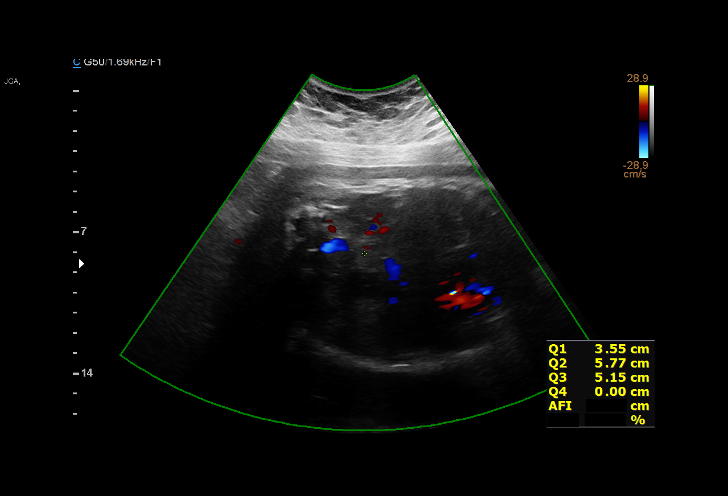

[14 of 28 positions shown; findings below may reference images not displayed]

Indications

 Previous cesarean delivery, antepartum x3
 Encounter for antenatal screening for
 malformations
 Encounter for uncertain dates
 Late to prenatal care, third trimester
 Obesity complicating pregnancy, third
 trimester (BMI>50)
 37 weeks gestation of pregnancy
Vital Signs

                                                Height:        5'8"
Fetal Evaluation

 Num Of Fetuses:         1
 Fetal Heart Rate(bpm):  138
 Cardiac Activity:       Observed
 Presentation:           Cephalic
 Placenta:               Posterior
 P. Cord Insertion:      Not well visualized

 Amniotic Fluid
 AFI FV:      Within normal limits

 AFI Sum(cm)     %Tile       Largest Pocket(cm)
 14.47           55

 RUQ(cm)       RLQ(cm)       LUQ(cm)        LLQ(cm)
 3.55          0
Biophysical Evaluation

 Amniotic F.V:   Pocket => 2 cm             F. Tone:        Observed
 F. Movement:    Observed                   Score:          [DATE]
 F. Breathing:   Observed
Biometry

 BPD:      95.2  mm     G. Age:  38w 6d         91  %    CI:        76.52   %    70 - 86
                                                         FL/HC:      19.5   %    20.9 -
 HC:      344.8  mm     G. Age:  39w 6d         79  %    HC/AC:      1.02        0.92 -
 AC:       339   mm     G. Age:  37w 6d         69  %    FL/BPD:     70.6   %    71 - 87
 FL:       67.2  mm     G. Age:  34w 4d        1.7  %    FL/AC:      19.8   %    20 - 24
 HUM:      61.6  mm     G. Age:  35w 5d         37  %
 LV:        3.4  mm

 Est. FW:    0251  gm      7 lb 1 oz     52  %
OB History

 Gravidity:    4         Term:   3        Prem:   0        SAB:   0
 TOP:          0       Ectopic:  0        Living: 3
Gestational Age

 LMP:           26w 6d        Date:  10/24/19                 EDD:   07/30/20
 U/S Today:     37w 6d                                        EDD:   05/14/20
 Best:          37w 5d     Det. By:  U/S  (03/31/20)          EDD:   05/15/20
Anatomy

 Cranium:               Previously seen        Aortic Arch:            Previously seen
 Cavum:                 Previously seen        Ductal Arch:            Not well visualized
 Ventricles:            Previously seen        Diaphragm:              Not well visualized
 Choroid Plexus:        Visualized             Stomach:                Appears normal, left
                                                                       sided
 Cerebellum:            Previously seen        Abdomen:                Previously seen
                        (Limited)
 Posterior Fossa:       Previously seen        Abdominal Wall:         Not well visualized
                        (Limited)
 Nuchal Fold:           Not applicable (>20    Cord Vessels:           Previously seen
                        wks GA)
 Face:                  Profile previously     Kidneys:                Appear normal
                        seen
 Lips:                  Previously seen        Bladder:                Appears normal
 Thoracic:              Appears normal         Spine:                  Limited views
                                                                       previously seen
 Heart:                 Previously seen        Upper Extremities:      Visualized
                                                                       previously
 RVOT:                  Not well visualized    Lower Extremities:      Visualized
                                                                       previously
 LVOT:                  Previously seen

 Other:  Male gender previously seen. Right Heel previously seen.
Impression

 Late prenatal care.  Maternal obesity.  Patient returns for fetal
 growth assessment and antenatal testing.  She does not
 have gestational diabetes.  Blood pressure today at her office
 is 133/63 mmHg.

 Fetal growth is appropriate for gestational age.  Amniotic fluid
 is normal and good fetal activity seen.Antenatal testing is
 reassuring. BPP [DATE].  Cephalic presentation.
Recommendations

 BPP next week.
                 Familia, Robbie

## 2022-12-10 ENCOUNTER — Telehealth: Payer: Medicaid Other | Admitting: Physician Assistant

## 2022-12-10 DIAGNOSIS — I10 Essential (primary) hypertension: Secondary | ICD-10-CM

## 2022-12-10 NOTE — Progress Notes (Signed)
Because we cannot refill medications via e-visit and because you need BP check and heart exam, I feel your condition warrants further evaluation and I recommend that you be seen in a face to face visit.   NOTE: There will be NO CHARGE for this eVisit   If you are having a true medical emergency please call 911.      For an urgent face to face visit, [facility] has eight urgent care centers for your convenience:   NEW!! [facility] at [facility] Get Driving Directions [phone] [address] [place], [address]    [facility] Center at [facility] Get Driving Directions [phone] [address] [place], [place] [address]   [facility] Urgent [facility] Center [facility]) Get Driving Directions [phone] [address] [place], [place] [address]  [facility] Center [facility]) Get Driving Directions [phone] [address] [place],  [place]  [address]  [facility] - at [employer]  [phone] [address] [place],  [place] [address]   [facility] Health Urgent Care at [facility] Get Driving Directions [phone] [address] [place], [place] [address]   [facility] Health Urgent Care at [facility] Get Driving Directions  [phone] [address] [place], [place] [address]   [facility] Health Urgent Care at [facility] Directions [phone] [address], [address] [place], [place] [address]  Your MyChart E-visit questionnaire answers were reviewed by a board certified advanced clinical practitioner to complete your personal care plan based on your specific symptoms.  Thank you for using e-Visits.

## 2022-12-28 ENCOUNTER — Ambulatory Visit: Payer: Medicaid Other | Attending: Internal Medicine | Admitting: Internal Medicine

## 2022-12-28 ENCOUNTER — Encounter: Payer: Self-pay | Admitting: Internal Medicine

## 2022-12-28 VITALS — BP 146/92 | HR 77 | Temp 98.2°F | Ht 68.0 in | Wt >= 6400 oz

## 2022-12-28 DIAGNOSIS — Z6841 Body Mass Index (BMI) 40.0 and over, adult: Secondary | ICD-10-CM

## 2022-12-28 DIAGNOSIS — Z7689 Persons encountering health services in other specified circumstances: Secondary | ICD-10-CM

## 2022-12-28 DIAGNOSIS — I10 Essential (primary) hypertension: Secondary | ICD-10-CM

## 2022-12-28 DIAGNOSIS — F411 Generalized anxiety disorder: Secondary | ICD-10-CM | POA: Diagnosis not present

## 2022-12-28 DIAGNOSIS — F320 Major depressive disorder, single episode, mild: Secondary | ICD-10-CM

## 2022-12-28 LAB — POCT GLYCOSYLATED HEMOGLOBIN (HGB A1C): HbA1c POC (<> result, manual entry): 5.5 % (ref 4.0–5.6)

## 2022-12-28 MED ORDER — SERTRALINE HCL 25 MG PO TABS: 25.0000 mg | ORAL_TABLET | Freq: Every day | ORAL | 1 refills | Status: DC

## 2022-12-28 MED ORDER — HYDROCHLOROTHIAZIDE 12.5 MG PO TABS: 12.5000 mg | ORAL_TABLET | Freq: Every day | ORAL | 3 refills | Status: DC

## 2022-12-28 MED ORDER — WEGOVY 0.25 MG/0.5ML ~~LOC~~ SOAJ
0.2500 mg | SUBCUTANEOUS | 1 refills | Status: AC
Start: 2022-12-28 — End: ?
  Filled 2023-01-04: qty 2, 28d supply, fill #0
  Filled 2023-01-22 – 2023-01-25 (×2): qty 2, 28d supply, fill #1

## 2022-12-28 MED ORDER — AMLODIPINE BESYLATE 5 MG PO TABS: 5.0000 mg | ORAL_TABLET | Freq: Every day | ORAL | 1 refills | Status: DC

## 2022-12-28 NOTE — Patient Instructions (Signed)
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
## 2023-01-01 ENCOUNTER — Encounter: Payer: Self-pay | Admitting: Internal Medicine

## 2023-01-02 ENCOUNTER — Telehealth: Payer: Self-pay | Admitting: Internal Medicine

## 2023-01-02 ENCOUNTER — Other Ambulatory Visit: Payer: Self-pay

## 2023-01-02 NOTE — Telephone Encounter (Signed)
-----   Message from Weldon Picking sent at 01/02/2023  8:17 AM EDT ----- Regarding: RE: PA for [facility]. Request received ----- Message ----- From: Marcine Matar, MD Sent: 01/01/2023   1:13 PM EDT To: Weldon Picking, CPhT Subject: PA for [phone].                                 Patient needs prior approval for [facility].

## 2023-01-03 ENCOUNTER — Other Ambulatory Visit: Payer: Self-pay

## 2023-01-04 ENCOUNTER — Other Ambulatory Visit (HOSPITAL_COMMUNITY): Payer: Self-pay

## 2023-01-04 ENCOUNTER — Other Ambulatory Visit: Payer: Self-pay

## 2023-01-19 ENCOUNTER — Other Ambulatory Visit: Payer: Self-pay | Admitting: Internal Medicine

## 2023-01-19 DIAGNOSIS — F411 Generalized anxiety disorder: Secondary | ICD-10-CM

## 2023-01-19 DIAGNOSIS — F320 Major depressive disorder, single episode, mild: Secondary | ICD-10-CM

## 2023-01-22 ENCOUNTER — Other Ambulatory Visit: Payer: Self-pay

## 2023-01-22 NOTE — Telephone Encounter (Signed)
Requested medication (s) are due for refill today- no  Requested medication (s) are on the active medication list -yes  Future visit scheduled -yes  Last refill: 12/28/22 #30 1RF  Notes to clinic: Request for 90 day supply- appointment scheduled 02/08/23- sent for review of request   Requested Prescriptions  Pending Prescriptions Disp Refills   sertraline (ZOLOFT) 25 MG tablet [Pharmacy Med Name: SERTRALINE HCL 25 MG TABLET] 90 tablet 1    Sig: Take 1 tablet (25 mg total) by mouth daily.     Psychiatry:  Antidepressants - SSRI - sertraline Passed - 01/19/2023  9:30 AM      Passed - AST in normal range and within 360 days    AST  Date Value Ref Range Status  12/28/2022 12 0 - 40 IU/L Final         Passed - ALT in normal range and within 360 days    ALT  Date Value Ref Range Status  12/28/2022 20 0 - 32 IU/L Final         Passed - Completed PHQ-2 or PHQ-9 in the last 360 days      Passed - Valid encounter within last 6 months    Recent Outpatient Visits           3 weeks ago Establishing care with new doctor, encounter for   [facility] Marcine Matar, MD   3 years ago Encounter to establish care   [facility] at [facility], Kandee Keen, MD       Future Appointments             In 2 weeks Marcine Matar, MD [facility]               Requested Prescriptions  Pending Prescriptions Disp Refills   sertraline (ZOLOFT) 25 MG tablet [Pharmacy Med Name: SERTRALINE HCL 25 MG TABLET] 90 tablet 1    Sig: Take 1 tablet (25 mg total) by mouth daily.     Psychiatry:  Antidepressants - SSRI - sertraline Passed - 01/19/2023  9:30 AM      Passed - AST in normal range and within 360 days    AST  Date Value Ref Range Status  12/28/2022 12 0 - 40 IU/L Final         Passed - ALT in normal range and within 360 days    ALT  Date Value Ref Range Status  12/28/2022 20 0 -  32 IU/L Final         Passed - Completed PHQ-2 or PHQ-9 in the last 360 days      Passed - Valid encounter within last 6 months    Recent Outpatient Visits           3 weeks ago Establishing care with new doctor, encounter for   [facility] Marcine Matar, MD   3 years ago Encounter to establish care   [facility] Care at [facility], Kandee Keen, MD       Future Appointments             In 2 weeks Marcine Matar, MD [facility]

## 2023-01-25 ENCOUNTER — Other Ambulatory Visit: Payer: Self-pay

## 2023-01-29 ENCOUNTER — Other Ambulatory Visit: Payer: Self-pay

## 2023-01-30 ENCOUNTER — Other Ambulatory Visit (HOSPITAL_COMMUNITY): Payer: Self-pay

## 2023-01-30 ENCOUNTER — Other Ambulatory Visit: Payer: Self-pay

## 2023-02-01 ENCOUNTER — Encounter: Payer: Self-pay | Admitting: Pharmacist

## 2023-02-05 ENCOUNTER — Telehealth: Payer: Self-pay

## 2023-02-05 NOTE — Telephone Encounter (Signed)
Message received from [facility] noting that the patient needs transportation to her upcoming appointment at [facility] 02/08/2023.  I called patient and left a message requesting a call back.    Will provide her with the number for [facility] [employer]: [phone] and if she is not able to schedule her ride through them, I will order cab transportation for her

## 2023-02-08 ENCOUNTER — Ambulatory Visit: Payer: Medicaid Other | Attending: Internal Medicine | Admitting: Internal Medicine

## 2023-02-13 ENCOUNTER — Ambulatory Visit: Payer: Medicaid Other | Admitting: Nurse Practitioner

## 2023-02-19 ENCOUNTER — Telehealth: Payer: Self-pay | Admitting: Internal Medicine

## 2023-02-19 ENCOUNTER — Other Ambulatory Visit: Payer: Self-pay

## 2023-02-19 DIAGNOSIS — F320 Major depressive disorder, single episode, mild: Secondary | ICD-10-CM

## 2023-02-19 DIAGNOSIS — F411 Generalized anxiety disorder: Secondary | ICD-10-CM

## 2023-02-19 MED ORDER — WEGOVY 0.5 MG/0.5ML ~~LOC~~ SOAJ
0.5000 mg | SUBCUTANEOUS | 0 refills | Status: DC
  Filled 2023-02-19 – 2023-02-20 (×2): qty 2, 28d supply, fill #0

## 2023-02-19 MED ORDER — SERTRALINE HCL 25 MG PO TABS
25.0000 mg | ORAL_TABLET | Freq: Every day | ORAL | 1 refills | Status: DC
Start: 2023-02-19 — End: 2023-05-24

## 2023-02-19 NOTE — Telephone Encounter (Signed)
Medication Refill - Medication: Semaglutide-Weight Management (WEGOVY) 0.25 MG/0.5ML SOAJ,sertraline (ZOLOFT) 25 MG tablet   Pt asked if it was possible to get a courtesy refill to get her through until her appointment 10/30. Requested increase dose of Wegovy.  Please advise.   Has the patient contacted their pharmacy? No. (Agent: If no, request that the patient contact the pharmacy for the refill. If patient does not wish to contact the pharmacy document the reason why and proceed with request.)   Preferred Pharmacy (with phone number or street name):   Please send Semaglutide-Weight Management (WEGOVY) 0.25 MG/0.5ML to pharmacy below. [facility]  [address] [place] [address]  Phone: [phone] Fax: [phone]  Hours: M-F 7:30a-6:00p   Please send sertraline (ZOLOFT) 25 MG tablet to pharmacy below.   [id number] [place], [place] - [address] AT [facility] [address]  [address] [place] [address]  Phone: [phone] Fax: [phone]  Hours: Not open 24 hours    Has the patient been seen for an appointment in the last year OR does the patient have an upcoming appointment? Yes.    Agent: Please be advised that RX refills may take up to 3 business days. We ask that you follow-up with your pharmacy.

## 2023-02-19 NOTE — Telephone Encounter (Signed)
Step up dose of Wegovy (0.5 mg weekly) sent. Refill sent for sertraline.

## 2023-02-20 ENCOUNTER — Other Ambulatory Visit (HOSPITAL_COMMUNITY): Payer: Self-pay

## 2023-02-20 ENCOUNTER — Other Ambulatory Visit: Payer: Self-pay

## 2023-02-21 ENCOUNTER — Other Ambulatory Visit: Payer: Self-pay

## 2023-03-01 ENCOUNTER — Other Ambulatory Visit: Payer: Self-pay

## 2023-03-13 ENCOUNTER — Ambulatory Visit
Admission: RE | Admit: 2023-03-13 | Discharge: 2023-03-13 | Disposition: A | Discharge status: Home / Self Care | Payer: Medicaid Other | Source: Ambulatory Visit | Attending: Internal Medicine | Admitting: Internal Medicine

## 2023-03-13 VITALS — BP 151/85 | HR 69 | Temp 98.0°F | Resp 17 | Ht 68.0 in | Wt 392.7 lb

## 2023-03-13 DIAGNOSIS — L02811 Cutaneous abscess of head [any part, except face]: Secondary | ICD-10-CM

## 2023-03-13 DIAGNOSIS — J069 Acute upper respiratory infection, unspecified: Secondary | ICD-10-CM

## 2023-03-13 MED ORDER — DOXYCYCLINE HYCLATE 100 MG PO CAPS: 100.0000 mg | ORAL_CAPSULE | Freq: Two times a day (BID) | ORAL | 0 refills | Status: DC

## 2023-03-13 NOTE — Discharge Instructions (Signed)
I have prescribed an antibiotic to treat upper respiratory infection as well as abscess behind your ear.  Use warm compresses to abscess as we discussed.

## 2023-03-13 NOTE — ED Triage Notes (Signed)
Patient states she have a sinus infection, bump in back of left ear that she drained and it came back x 3 days. States pressure is causing her ear pain. Treated with Prid pain relief.

## 2023-03-13 NOTE — ED Provider Notes (Signed)
[facility]    CSN: [id number] Arrival date & time: 03/13/23  1003      History   Chief Complaint Chief Complaint  Patient presents with   Blister    I have a boil on the back of my right ear it is painful and swelling and it causes my right ear to hurt plus I feel that I have a sinus infection as well - Entered by patient    HPI Terri Tran is a 32 y.o. female.   Patient presents with 2 different chief complaints today.  Reports that she has had approximately 1 week history of nasal congestion and sinus pressure.  Reports very minimal coughing.  Denies any known sick contacts or fever.  She has not taken any medications for her symptoms.  Also reporting possible abscess behind her right ear that started a few days ago.  Reports that she attempted to drain it with some purulent drainage.  Denies any fever.     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   History of cesarean section 05/09/2020   Cesarean delivery delivered 05/09/2020   History of bilateral tubal ligation 05/09/2020   History of cesarean delivery 03/15/2020   Depression 10/14/2019   Generalized anxiety disorder 10/14/2019   History of GBS bacteriuria 09/13/2018   Gestational hypertension 08/31/2016   Obesity in pregnancy 08/28/2016   Morbid obesity with body mass index (BMI) of 60.0 to 69.9 in adult [facility]) 08/28/2016    Past Surgical History:   Procedure Laterality Date   CESAREAN SECTION     CESAREAN SECTION N/A 09/02/2016   Procedure: CESAREAN SECTION;  Surgeon: Lesly Dukes, MD;  Location: [facility] BIRTHING SUITES;  Service: Obstetrics;  Laterality: N/A;   CESAREAN SECTION N/A 02/02/2019   Procedure: CESAREAN SECTION;  Surgeon: Levie Heritage, DO;  Location: [facility] LD ORS;  Service: Obstetrics;  Laterality: N/A;   CESAREAN SECTION N/A    Phreesia 10/12/2019   CESAREAN SECTION N/A 05/09/2020   Procedure: CESAREAN SECTION;  Surgeon: Venora Maples, MD;  Location: [facility] LD ORS;  Service: Obstetrics;  Laterality: N/A;    OB History     Gravida  4   Para  4   Term  4   Preterm  0   AB  0   Living  4      SAB  0   IAB  0   Ectopic  0   Multiple  0   Live Births  4            Home Medications    Prior to Admission medications   Medication Sig Start Date End Date Taking? Authorizing Provider  doxycycline (VIBRAMYCIN) 100 MG capsule Take 1 capsule (100 mg total) by mouth 2 (two) times daily. 03/13/23  Yes Ura Yingling, Rolly Salter E, FNP  amLODipine (NORVASC) 5 MG tablet Take 1 tablet (5 mg total) by  mouth daily. 12/28/22   Marcine Matar, MD  Blood Pressure Monitoring (BLOOD PRESSURE CUFF) MISC Take BP once daily. Patient not taking: Reported on 12/28/2022 02/26/22   Carlisle Beers, FNP  hydrochlorothiazide (HYDRODIURIL) 12.5 MG tablet Take 1 tablet (12.5 mg total) by mouth daily. 12/28/22   Marcine Matar, MD  Semaglutide-Weight Management (WEGOVY) 0.5 MG/0.5ML SOAJ Inject 0.5 mg into the skin once a week. 02/19/23   Marcine Matar, MD  sertraline (ZOLOFT) 25 MG tablet Take 1 tablet (25 mg total) by mouth daily. 02/19/23   Marcine Matar, MD    Family History Family History  Problem Relation Age of Onset   Hypertension Mother     Social History Social History   Tobacco Use   Smoking status: Former    Current packs/day: 0.00    Types: Cigarettes    Quit date: 07/21/2018    Years since quitting:  4.6   Smokeless tobacco: Never  Vaping Use   Vaping status: Never Used  Substance Use Topics   Alcohol use: No    Alcohol/week: 7.0 standard drinks of alcohol    Types: 7 Cans of beer per week   Drug use: No     Allergies   Penicillins   Review of Systems Review of Systems Per HPI  Physical Exam Triage Vital Signs ED Triage Vitals  Encounter Vitals Group     BP 03/13/23 1052 (!) 151/85     Systolic BP Percentile --      Diastolic BP Percentile --      Pulse Rate 03/13/23 1043 69     Resp 03/13/23 1043 17     Temp 03/13/23 1043 98 F (36.7 C)     Temp Source 03/13/23 1043 Oral     SpO2 03/13/23 1043 98 %     Weight 03/13/23 1041 (!) 392 lb 11.2 oz (178.1 kg)     Height 03/13/23 1041 5\' 8"  (1.727 m)     Head Circumference --      Peak Flow --      Pain Score 03/13/23 1041 8     Pain Loc --      Pain Education --      Exclude from Growth Chart --    No data found.  Updated Vital Signs BP (!) 151/85 (BP Location: Left Wrist)   Pulse 69   Temp 98 F (36.7 C) (Oral)   Resp 17   Ht 5\' 8"  (1.727 m)   Wt (!) 392 lb 11.2 oz (178.1 kg)   LMP 02/27/2023   SpO2 98%   BMI 59.71 kg/m   Visual Acuity Right Eye Distance:   Left Eye Distance:   Bilateral Distance:    Right Eye Near:   Left Eye Near:    Bilateral Near:     Physical Exam Constitutional:      General: She is not in acute distress.    Appearance: Normal appearance. She is not toxic-appearing or diaphoretic.  HENT:     Head: Normocephalic and atraumatic.     Right Ear: Tympanic membrane and ear canal normal.     Left Ear: Tympanic membrane and ear canal normal.     Ears:     Comments: Patient has approximately 1 cm indurated abscess like lesion to the postauricular right ear.  No drainage noted.    Nose: Congestion present.     Mouth/Throat:     Mouth: Mucous membranes are moist.     Pharynx: No  posterior oropharyngeal erythema.  Eyes:     Extraocular Movements: Extraocular movements intact.      Conjunctiva/sclera: Conjunctivae normal.     Pupils: Pupils are equal, round, and reactive to light.  Cardiovascular:     Rate and Rhythm: Normal rate and regular rhythm.     Pulses: Normal pulses.     Heart sounds: Normal heart sounds.  Pulmonary:     Effort: Pulmonary effort is normal. No respiratory distress.     Breath sounds: Normal breath sounds. No stridor. No wheezing, rhonchi or rales.  Abdominal:     General: Abdomen is flat. Bowel sounds are normal.     Palpations: Abdomen is soft.  Musculoskeletal:        General: Normal range of motion.     Cervical back: Normal range of motion.  Skin:    General: Skin is warm and dry.  Neurological:     General: No focal deficit present.     Mental Status: She is alert and oriented to person, place, and time. Mental status is at baseline.  Psychiatric:        Mood and Affect: Mood normal.        Behavior: Behavior normal.      UC Treatments / Results  Labs (all labs ordered are listed, but only abnormal results are displayed) Labs Reviewed - No data to display  EKG   Radiology No results found.  Procedures Procedures (including critical care time)  Medications Ordered in UC Medications - No data to display  Initial Impression / Assessment and Plan / UC Course  I have reviewed the triage vital signs and the nursing notes.  Pertinent labs & imaging results that were available during my care of the patient were reviewed by me and considered in my medical decision making (see chart for details).     1.  Upper respiratory infection  Given duration of symptoms and sinus pressure, will treat for sinus infection with doxycycline antibiotic given penicillin allergy.  Advised supportive care and symptom management.  Viral testing deferred given duration of symptoms as it would not change treatment.  2.  Postauricular abscess  It appears that patient has small abscess behind right ear.  It did not not appear to be  associated with mastoid infection or lymph node.  No I&D indicated at this time.  Doxycycline should treat this as well.  Also advised warm compresses.  Advised strict return precautions for all chief complaints today.  Patient verbalized understanding and was agreeable with plan. Final Clinical Impressions(s) / UC Diagnoses   Final diagnoses:  Acute upper respiratory infection  Abscess, postauricular     Discharge Instructions      I have prescribed an antibiotic to treat upper respiratory infection as well as abscess behind your ear.  Use warm compresses to abscess as we discussed.    ED Prescriptions     Medication Sig Dispense Auth. Provider   doxycycline (VIBRAMYCIN) 100 MG capsule Take 1 capsule (100 mg total) by mouth 2 (two) times daily. 20 capsule Gustavus Bryant, [place]      PDMP not reviewed this encounter.   Gustavus Bryant, [place] 03/13/23 1136

## 2023-03-20 ENCOUNTER — Ambulatory Visit: Payer: Medicaid Other | Admitting: Physician Assistant

## 2023-03-20 NOTE — Progress Notes (Deleted)
Patient ID: Terri Tran, female   DOB: Oct 22, 1990, 32 y.o.   MRN: [id number]  UC visit 03/13/23 with 1. Upper respiratory infection   Given duration of symptoms and sinus pressure, will treat for sinus infection with doxycycline antibiotic given penicillin allergy.  Advised supportive care and symptom management.  Viral testing deferred given duration of symptoms as it would not change treatment.   2.  Postauricular abscess   It appears that patient has small abscess behind right ear.  It did not not appear to be associated with mastoid infection or lymph node.  No I&D indicated at this time.  Doxycycline should treat this as well.

## 2023-04-04 ENCOUNTER — Ambulatory Visit: Payer: Medicaid Other | Admitting: Physician Assistant

## 2023-04-04 ENCOUNTER — Other Ambulatory Visit: Payer: Self-pay

## 2023-04-04 ENCOUNTER — Other Ambulatory Visit: Payer: Self-pay | Admitting: Internal Medicine

## 2023-04-08 ENCOUNTER — Other Ambulatory Visit: Payer: Self-pay

## 2023-04-08 ENCOUNTER — Ambulatory Visit: Payer: Self-pay

## 2023-04-08 NOTE — Telephone Encounter (Signed)
Chief Complaint: Medication Question  Disposition: [] ED /[] Urgent Care (no appt availability in office) / [] Appointment(In office/virtual)/ []  [place] [facility]/ [x] Home Care/ [] Refused Recommended Disposition /[] [place] [facility]/ []  Follow-up with PCP Additional Notes: Patient requesting a refill for [facility] injection. Patient stated she was told that she needed an appointment to receive additional refills. Care advice was given and patient has been scheduled with PCP for first available appointment on  06/12/22 at 1550.   Summary: rx concern   The patient has called requesting contact with a member of clinical staff regarding their prescription for Rx #: [id number] Semaglutide-Weight Management (WEGOVY) 0.5 MG/0.5ML Ivory Broad [[id number]]  The patient is concerned with going roughly two weeks without the medication and concerned with medication continuity    Please contact further when possible     Reason for Disposition  Caller has medicine question only, adult not sick, AND triager answers question  Answer Assessment - Initial Assessment Questions 1. NAME of MEDICINE: "What medicine(s) are you calling about?"      Semaglutide-Weight Management (WEGOVY) 0.5 MG/0.5ML SOAJ  2. QUESTION: "What is your question?" (e.g., double dose of medicine, side effect)     Can I have a refill until my appointment? I've been without the medication for 2 weeks now.  3. PRESCRIBER: "Who prescribed the medicine?" Reason: if prescribed by specialist, call should be referred to that group.     Dr. Laural Benes  4. SYMPTOMS: "Do you have any symptoms?" If Yes, ask: "What symptoms are you having?"  "How bad are the symptoms (e.g., mild, moderate, severe)     None  Protocols used: Medication Question Call-A-AH

## 2023-04-09 ENCOUNTER — Ambulatory Visit: Payer: Medicaid Other | Admitting: Dietician

## 2023-04-09 MED ORDER — WEGOVY 0.5 MG/0.5ML ~~LOC~~ SOAJ: 0.5000 mg | SUBCUTANEOUS | 0 refills | Status: DC

## 2023-04-09 NOTE — Telephone Encounter (Signed)
Requested Prescriptions  Pending Prescriptions Disp Refills   Semaglutide-Weight Management (WEGOVY) 0.5 MG/0.5ML SOAJ 6 mL 0    Sig: Inject 0.5 mg into the skin once a week.     Endocrinology:  Diabetes - GLP-1 Receptor Agonists - semaglutide Passed - 04/08/2023  4:16 PM      Passed - HBA1C in normal range and within 180 days    HbA1c POC (<> result, manual entry)  Date Value Ref Range Status  12/28/2022 5.5 4.0 - 5.6 % Final         Passed - Cr in normal range and within 360 days    Creat  Date Value Ref Range Status  02/02/2016 0.73 0.50 - 1.10 mg/dL Final   Creatinine, Ser  Date Value Ref Range Status  12/28/2022 0.74 0.57 - 1.00 mg/dL Final   Creatinine, Urine  Date Value Ref Range Status  02/02/2016 286 20 - 320 mg/dL Final         Passed - Valid encounter within last 6 months    Recent Outpatient Visits           3 months ago Establishing care with new doctor, encounter for   [facility] [place] [facility] [place]. [facility] Marcine Matar, MD   3 years ago Encounter to establish care   [facility], Kandee Keen, MD       Future Appointments             In 2 months Laural Benes Binnie Rail, MD [facility]. [facility]

## 2023-04-10 ENCOUNTER — Other Ambulatory Visit: Payer: Self-pay | Admitting: Internal Medicine

## 2023-04-12 ENCOUNTER — Other Ambulatory Visit: Payer: Self-pay

## 2023-05-19 ENCOUNTER — Telehealth: Payer: Medicaid Other | Admitting: Family

## 2023-05-19 DIAGNOSIS — R399 Unspecified symptoms and signs involving the genitourinary system: Secondary | ICD-10-CM

## 2023-05-19 MED ORDER — SULFAMETHOXAZOLE-TRIMETHOPRIM 800-160 MG PO TABS: 1.0000 | ORAL_TABLET | Freq: Two times a day (BID) | ORAL | 0 refills | Status: DC

## 2023-05-19 NOTE — Progress Notes (Signed)
Virtual Visit Consent   Terri Tran, you are scheduled for a virtual visit with a [facility] provider today. Just as with appointments in the office, your consent must be obtained to participate. Your consent will be active for this visit and any virtual visit you may have with one of our providers in the next 365 days. If you have a MyChart account, a copy of this consent can be sent to you electronically.  As this is a virtual visit, video technology does not allow for your provider to perform a traditional examination. This may limit your provider's ability to fully assess your condition. If your provider identifies any concerns that need to be evaluated in person or the need to arrange testing (such as labs, EKG, etc.), we will make arrangements to do so. Although advances in technology are sophisticated, we cannot ensure that it will always work on either your end or our end. If the connection with a video visit is poor, the visit may have to be switched to a telephone visit. With either a video or telephone visit, we are not always able to ensure that we have a secure connection.  By engaging in this virtual visit, you consent to the provision of healthcare and authorize for your insurance to be billed (if applicable) for the services provided during this visit. Depending on your insurance coverage, you may receive a charge related to this service.  I need to obtain your verbal consent now. Are you willing to proceed with your visit today? Terri Tran has provided verbal consent on 05/19/2023 for a virtual visit (video or telephone). Jannifer Rodney, FNP  Date: 05/19/2023 2:53 PM  Virtual Visit via Video Note   I, Jannifer Rodney, connected with  Terri Tran  ([id number], Nov 25, 1990) on 05/19/23 at  3:00 PM EST by a video-enabled telemedicine application and verified that I am speaking with the correct person using two identifiers.  Location: Patient: Virtual Visit Location Patient:  Home Provider: Virtual Visit Location Provider: Home Office   I discussed the limitations of evaluation and management by telemedicine and the availability of in person appointments. The patient expressed understanding and agreed to proceed.    History of Present Illness: Terri Tran is a 32 y.o. who identifies as a female who was assigned female at birth, and is being seen today for UTI symptoms that started two days ago.  HPI: Urinary Frequency  The current episode started in the past 7 days. The problem occurs intermittently. Quality: pressure. The pain is at a severity of 8/10. The pain is mild. Associated symptoms include frequency, hesitancy and urgency. Pertinent negatives include no flank pain, hematuria, nausea or vomiting. She has tried increased fluids for the symptoms. The treatment provided mild relief.    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   History of cesarean section 05/09/2020   Cesarean delivery delivered 05/09/2020   History of bilateral tubal ligation 05/09/2020   History of cesarean delivery 03/15/2020   Depression 10/14/2019   Generalized anxiety disorder 10/14/2019   History of GBS bacteriuria 09/13/2018   Gestational hypertension 08/31/2016   Obesity in pregnancy 08/28/2016   Morbid obesity with body mass index (BMI) of 60.0 to 69.9 in adult (HCC) 08/28/2016    Allergies:  Allergies  Allergen Reactions   Penicillins Hives   Medications:  Current Outpatient Medications:    sulfamethoxazole-trimethoprim (BACTRIM DS)  800-160 MG tablet, Take 1 tablet by mouth 2 (two) times daily., Disp: 14 tablet, Rfl: 0   amLODipine (NORVASC) 5 MG tablet, Take 1 tablet (5 mg total) by mouth daily., Disp: 90 tablet, Rfl: 1   Blood Pressure Monitoring (BLOOD PRESSURE CUFF) MISC, Take BP once daily. (Patient not taking: Reported on 12/28/2022), Disp: 1 each, Rfl: 0    hydrochlorothiazide (HYDRODIURIL) 12.5 MG tablet, Take 1 tablet (12.5 mg total) by mouth daily., Disp: 90 tablet, Rfl: 3   Semaglutide-Weight Management (WEGOVY) 0.5 MG/0.5ML SOAJ, Inject 0.5 mg into the skin once a week., Disp: 6 mL, Rfl: 0   sertraline (ZOLOFT) 25 MG tablet, Take 1 tablet (25 mg total) by mouth daily., Disp: 30 tablet, Rfl: 1  Observations/Objective: Patient is well-developed, well-nourished in no acute distress.  Resting comfortably  at home.  Head is normocephalic, atraumatic.  No labored breathing.  Speech is clear and coherent with logical content.  Patient is alert and oriented at baseline.    Assessment and Plan: 1. UTI symptoms (Primary) - sulfamethoxazole-trimethoprim (BACTRIM DS) 800-160 MG tablet; Take 1 tablet by mouth 2 (two) times daily.  Dispense: 14 tablet; Refill: 0  Force fluids AZO over the counter X2 days Follow up if symptoms worsen or do not improve   Follow Up Instructions: I discussed the assessment and treatment plan with the patient. The patient was provided an opportunity to ask questions and all were answered. The patient agreed with the plan and demonstrated an understanding of the instructions.  A copy of instructions were sent to the patient via MyChart unless otherwise noted below.     The patient was advised to call back or seek an in-person evaluation if the symptoms worsen or if the condition fails to improve as anticipated.    Jannifer Rodney, FNP

## 2023-05-24 ENCOUNTER — Other Ambulatory Visit: Payer: Self-pay | Admitting: Internal Medicine

## 2023-05-24 DIAGNOSIS — F411 Generalized anxiety disorder: Secondary | ICD-10-CM

## 2023-05-24 DIAGNOSIS — F320 Major depressive disorder, single episode, mild: Secondary | ICD-10-CM

## 2023-05-31 ENCOUNTER — Ambulatory Visit
Admission: RE | Admit: 2023-05-31 | Discharge: 2023-05-31 | Disposition: A | Discharge status: Home / Self Care | Payer: Medicaid Other | Source: Ambulatory Visit | Attending: Physician Assistant | Admitting: Physician Assistant

## 2023-05-31 VITALS — BP 139/83 | HR 79 | Temp 97.9°F | Resp 20 | Ht 68.0 in | Wt 360.0 lb

## 2023-05-31 DIAGNOSIS — H66001 Acute suppurative otitis media without spontaneous rupture of ear drum, right ear: Secondary | ICD-10-CM

## 2023-05-31 DIAGNOSIS — J0140 Acute pansinusitis, unspecified: Secondary | ICD-10-CM

## 2023-05-31 MED ORDER — DOXYCYCLINE HYCLATE 100 MG PO CAPS: 100.0000 mg | ORAL_CAPSULE | Freq: Two times a day (BID) | ORAL | 0 refills | Status: DC

## 2023-05-31 NOTE — ED Triage Notes (Signed)
 Patient states her nose bleed when she blew it on yesterday, fullness in bilateral ears x 1 week.

## 2023-05-31 NOTE — ED Provider Notes (Signed)
 [facility]    CSN: [id number] Arrival date & time: 05/31/23  1658      History   Chief Complaint Chief Complaint  Patient presents with   Ear Fullness    Sinus infection as well - Entered by patient    HPI ANNALEIGHA Tran is a 33 y.o. female.   Patient presents today with a weeklong history of bilateral otalgia that is worse on the right with associated congestion.  Reports that she caught a viral illness from one of her children and initially had cough and congestion.  The symptoms have improved but she continues to have some sinus pressure, nasal congestion, otalgia.  She does have a history of recurrent ear infections whenever she gets sick and is concerned that is what is occurring currently.  Pain is rated 8 on a 0-10 pain scale, described as pressure, no aggravating or alleviating factors identified.  She denies any recent swimming or airplane travel.  She has not seen ENT in the past or had tubes.  She is confident she is not pregnant as she has had a tubal ligation.  She was treated with Bactrim  DS recently for UTI but denies additional antibiotics in the past 90 days.    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   History of cesarean section 05/09/2020   Cesarean delivery delivered 05/09/2020   History of bilateral tubal ligation 05/09/2020   History of cesarean delivery 03/15/2020   Depression 10/14/2019    Generalized anxiety disorder 10/14/2019   History of GBS bacteriuria 09/13/2018   Gestational hypertension 08/31/2016   Obesity in pregnancy 08/28/2016   Morbid obesity with body mass index (BMI) of 60.0 to 69.9 in adult [facility]) 08/28/2016    Past Surgical History:  Procedure Laterality Date   CESAREAN SECTION     CESAREAN SECTION N/A 09/02/2016   Procedure: CESAREAN SECTION;  Surgeon: Burnard VEAR Pate, MD;  Location: [facility];  Service: Obstetrics;  Laterality: N/A;   CESAREAN SECTION N/A 02/02/2019   Procedure: CESAREAN SECTION;  Surgeon: Barbra Lang PARAS, DO;  Location: [facility] LD ORS;  Service: Obstetrics;  Laterality: N/A;   CESAREAN SECTION N/A    Phreesia 10/12/2019   CESAREAN SECTION N/A 05/09/2020   Procedure: CESAREAN SECTION;  Surgeon: Lola Donnice CHRISTELLA, MD;  Location: [facility] LD ORS;  Service: Obstetrics;  Laterality: N/A;    OB History     Gravida  4   Para  4   Term  4   Preterm  0   AB  0   Living  4      SAB  0   IAB  0   Ectopic  0   Multiple  0   Live Births  4            Home Medications    Prior to Admission medications   Medication Sig Start Date End  Date Taking? Authorizing Provider  doxycycline  (VIBRAMYCIN ) 100 MG capsule Take 1 capsule (100 mg total) by mouth 2 (two) times daily. 05/31/23  Yes Ellesse Antenucci K, PA-C  amLODipine  (NORVASC ) 5 MG tablet Take 1 tablet (5 mg total) by mouth daily. 12/28/22   [name], MD  Blood Pressure Monitoring (BLOOD PRESSURE CUFF) MISC Take BP once daily. Patient not taking: Reported on 12/28/2022 02/26/22   Enedelia Dorna HERO, FNP  hydrochlorothiazide  (HYDRODIURIL ) 12.5 MG tablet Take 1 tablet (12.5 mg total) by mouth daily. 12/28/22   [name], MD  Semaglutide -Weight Management (WEGOVY ) 0.5 MG/0.5ML SOAJ Inject 0.5 mg into the skin once a week. 04/09/23   [name], MD  sertraline  (ZOLOFT ) 25 MG tablet TAKE 1 TABLET (25 MG TOTAL) BY MOUTH DAILY. 05/24/23   [name], MD     Family History Family History  Problem Relation Age of Onset   Hypertension Mother     Social History Social History   Tobacco Use   Smoking status: Former    Current packs/day: 0.00    Types: Cigarettes    Quit date: 07/21/2018    Years since quitting: 4.8   Smokeless tobacco: Never  Vaping Use   Vaping status: Never Used  Substance Use Topics   Alcohol use: Yes    Alcohol/week: 7.0 standard drinks of alcohol    Types: 7 Cans of beer per week    Comment: occ   Drug use: No     Allergies   Penicillins   Review of Systems Review of Systems  Constitutional:  Positive for activity change. Negative for appetite change, fatigue and fever.  HENT:  Positive for congestion, ear pain, postnasal drip and sinus pressure. Negative for sneezing and sore throat.   Respiratory:  Negative for cough and shortness of breath.   Cardiovascular:  Negative for chest pain.  Gastrointestinal:  Negative for abdominal pain, diarrhea, nausea and vomiting.     Physical Exam Triage Vital Signs ED Triage Vitals  Encounter Vitals Group     BP 05/31/23 1716 139/83     Systolic BP Percentile --      Diastolic BP Percentile --      Pulse Rate 05/31/23 1716 79     Resp 05/31/23 1716 20     Temp 05/31/23 1716 97.9 F (36.6 C)     Temp Source 05/31/23 1716 Oral     SpO2 05/31/23 1716 97 %     Weight 05/31/23 1714 (!) 360 lb (163.3 kg)     Height 05/31/23 1714 5' 8 (1.727 m)     Head Circumference --      Peak Flow --      Pain Score 05/31/23 1714 0     Pain Loc --      Pain Education --      Exclude from Growth Chart --    No data found.  Updated Vital Signs BP 139/83 (BP Location: Left Arm)   Pulse 79   Temp 97.9 F (36.6 C) (Oral) Comment: had a cold drink  Resp 20   Ht 5' 8 (1.727 m)   Wt (!) 360 lb (163.3 kg)   LMP 05/17/2023 (Approximate)   SpO2 97%   BMI 54.74 kg/m   Visual Acuity Right Eye Distance:   Left Eye Distance:   Bilateral Distance:    Right Eye  Near:   Left Eye Near:    Bilateral Near:     Physical Exam Vitals reviewed.  Constitutional:      General: She is awake. She is not in acute distress.    Appearance: Normal appearance. She is well-developed. She is not ill-appearing.     Comments: Very pleasant female stated age in no acute distress sitting comfortable in exam room  HENT:     Head: Normocephalic and atraumatic.     Right Ear: Ear canal and external ear normal. Tympanic membrane is erythematous and bulging.     Left Ear: Tympanic membrane, ear canal and external ear normal. Tympanic membrane is not erythematous or bulging.     Nose:     Right Sinus: Maxillary sinus tenderness and frontal sinus tenderness present.     Left Sinus: Maxillary sinus tenderness present. No frontal sinus tenderness.     Mouth/Throat:     Pharynx: Uvula midline. Postnasal drip present. No oropharyngeal exudate or posterior oropharyngeal erythema.  Cardiovascular:     Rate and Rhythm: Normal rate and regular rhythm.     Heart sounds: Normal heart sounds, S1 normal and S2 normal. No murmur heard. Pulmonary:     Effort: Pulmonary effort is normal.     Breath sounds: Normal breath sounds. No wheezing, rhonchi or rales.     Comments: Clear to auscultation bilaterally Psychiatric:        Behavior: Behavior is cooperative.      UC Treatments / Results  Labs (all labs ordered are listed, but only abnormal results are displayed) Labs Reviewed - No data to display  EKG   Radiology No results found.  Procedures Procedures (including critical care time)  Medications Ordered in UC Medications - No data to display  Initial Impression / Assessment and Plan / UC Course  I have reviewed the triage vital signs and the nursing notes.  Pertinent labs & imaging results that were available during my care of the patient were reviewed by me and considered in my medical decision making (see chart for details).     Patient is well-appearing,  afebrile, nontoxic, nontachycardic.  No indication for viral testing as she has been symptomatic for several weeks and this would not change management.  Otitis media identified on physical exam and will also cover for sinusitis given her ongoing and worsening symptoms.  She is allergic to penicillin  so we will use doxycycline .  Discussed that she is to avoid prolonged sun exposure while on this medication due to associated photosensitivity.  Recommended over-the-counter medication including Mucinex , Flonase , Tylenol , nasal saline/sinus rinses.  Given her recurrent ear infections I did recommend she follow-up with ENT and was given contact information for local provider with instruction to call to schedule an appointment.  We discussed that if her symptoms are not improving or if anything worsens she needs to be seen immediately.  Strict return precautions given.  She declined work excuse note.  Final Clinical Impressions(s) / UC Diagnoses   Final diagnoses:  Non-recurrent acute suppurative otitis media of right ear without spontaneous rupture of tympanic membrane  Acute non-recurrent pansinusitis     Discharge Instructions      We are treating you for an ear and sinus infection.  Start doxycycline  100 mg twice daily for 7 days.  Stay out of the sun while on this medication.  Continue over-the-counter medications including Mucinex , Flonase , Tylenol .  Make sure that you are resting and drinking plenty of fluid.  If your symptoms are not improving within a few days or if anything worsens please return for reevaluation.  Given your recurrent ear  infections I do recommend you follow-up with an ear nose and throat doctor.  Call them to schedule an appointment.     ED Prescriptions     Medication Sig Dispense Auth. Provider   doxycycline  (VIBRAMYCIN ) 100 MG capsule Take 1 capsule (100 mg total) by mouth 2 (two) times daily. 20 capsule Abbagail Scaff K, PA-C      PDMP not reviewed this encounter.    Sherrell Rocky POUR, PA-C 05/31/23 1732

## 2023-05-31 NOTE — Discharge Instructions (Signed)
 We are treating you for an ear and sinus infection.  Start doxycycline  100 mg twice daily for 7 days.  Stay out of the sun while on this medication.  Continue over-the-counter medications including Mucinex , Flonase , Tylenol .  Make sure that you are resting and drinking plenty of fluid.  If your symptoms are not improving within a few days or if anything worsens please return for reevaluation.  Given your recurrent ear infections I do recommend you follow-up with an ear nose and throat doctor.  Call them to schedule an appointment.

## 2023-06-08 ENCOUNTER — Other Ambulatory Visit: Payer: Self-pay | Admitting: Internal Medicine

## 2023-06-10 NOTE — Telephone Encounter (Signed)
Requested Prescriptions  Pending Prescriptions Disp Refills   amLODipine (NORVASC) 5 MG tablet [Pharmacy Med Name: AMLODIPINE BESYLATE 5 MG TAB] 90 tablet 1    Sig: TAKE 1 TABLET (5 MG TOTAL) BY MOUTH DAILY.     Cardiovascular: Calcium Channel Blockers 2 Passed - 06/10/2023  9:14 AM      Passed - Last BP in normal range    BP Readings from Last 1 Encounters:  05/31/23 139/83         Passed - Last Heart Rate in normal range    Pulse Readings from Last 1 Encounters:  05/31/23 79         Passed - Valid encounter within last 6 months    Recent Outpatient Visits           5 months ago Establishing care with new doctor, encounter for   [facility] [place]. [facility] Marcine Matar, MD   3 years ago Encounter to establish care   [facility], Kandee Keen, DO       Future Appointments             In 3 days Marcine Matar, MD [facility] [facility]. [facility]

## 2023-06-13 ENCOUNTER — Ambulatory Visit: Payer: Medicaid Other | Attending: Internal Medicine | Admitting: Internal Medicine

## 2023-06-13 VITALS — BP 133/84 | HR 80 | Temp 98.0°F | Ht 68.0 in | Wt 395.0 lb

## 2023-06-13 DIAGNOSIS — F411 Generalized anxiety disorder: Secondary | ICD-10-CM | POA: Diagnosis not present

## 2023-06-13 DIAGNOSIS — I10 Essential (primary) hypertension: Secondary | ICD-10-CM | POA: Diagnosis not present

## 2023-06-13 DIAGNOSIS — Z5986 Financial insecurity: Secondary | ICD-10-CM

## 2023-06-13 DIAGNOSIS — Z6841 Body Mass Index (BMI) 40.0 and over, adult: Secondary | ICD-10-CM | POA: Diagnosis not present

## 2023-06-13 DIAGNOSIS — E66813 Obesity, class 3: Secondary | ICD-10-CM

## 2023-06-13 DIAGNOSIS — Z5941 Food insecurity: Secondary | ICD-10-CM | POA: Diagnosis not present

## 2023-06-13 MED ORDER — SERTRALINE HCL 50 MG PO TABS: 50.0000 mg | ORAL_TABLET | Freq: Every day | ORAL | 4 refills | Status: DC

## 2023-06-13 MED ORDER — WEGOVY 1 MG/0.5ML ~~LOC~~ SOAJ: 1.0000 mg | SUBCUTANEOUS | 1 refills | Status: DC

## 2023-06-13 MED ORDER — AMLODIPINE BESYLATE 10 MG PO TABS: 10.0000 mg | ORAL_TABLET | Freq: Every day | ORAL | 4 refills | Status: DC

## 2023-06-13 NOTE — Patient Instructions (Signed)
Increase Wegovy to 1 mg once a week.

## 2023-06-13 NOTE — Progress Notes (Signed)
Patient ID: Terri Tran, female    DOB: 1990-06-03  MRN: [id number]  CC: Hypertension (HTN f/u. Franchot Erichsen med change - requesting xanax due to inability to sleep / anxiety /No to flu vax)   Subjective: Terri Tran is a 33 y.o. female who presents for chronic ds management. Her concerns today include:  Patient with history of HTN, obesity, depression, GAD   Discussed the use of AI scribe software for clinical note transcription with the patient, who gave verbal consent to proceed.  History of Present Illness   The patient, with a history of hypertension, anxiety, and obesity, presents for a follow-up visit.   HTN:  She has been adhering to her prescribed regimen of hydrochlorothiazide 12.5mg  and amlodipine 5mg  for hypertension, and has not been monitoring her blood pressure at home regularly.  Checks it only if she feels her blood pressure is high.  She reports feeling better since starting the medication and has been limiting her salt intake.  MDD/GAD:  She was started on Zoloft 25 mg and referred to behavioral health on last visit.  Patient told me that she was called but was told that they did not have anything available until February.  However when I looked under the notes and consult, she was called for behavioral health appointment for counseling and medication management but she had actually turned it down. She has been taking sertraline (Zoloft) for anxiety and initially felt calmer, but now reports feeling as though she has become immune to it. She describes increased worry, panic, and sleep disturbances.  She has four children and expresses that the stress and anxiety she experiences is largely due to her responsibilities as a parent. She also reports a family history of anxiety and depression.  For weight management, she has been taking Wegovy and has noticed some weight loss. She reports that the medication curbs her appetite but makes her feel exhausted on the day of the  injection. However, she also reports feeling a burst of energy in the days following the injection.  She is down 8 pounds since I last saw her in August.  She is on the 0.5 mg dose of the [facility].      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   History of cesarean section 05/09/2020   Cesarean delivery delivered 05/09/2020   History of bilateral tubal ligation 05/09/2020   History of cesarean delivery 03/15/2020   Depression 10/14/2019   Generalized anxiety disorder 10/14/2019   History of GBS bacteriuria 09/13/2018   Gestational hypertension 08/31/2016   Obesity in pregnancy 08/28/2016   Morbid obesity with body mass index (BMI) of 60.0 to 69.9 in adult [facility]) 08/28/2016     Current Outpatient Medications on File Prior to Visit  Medication Sig Dispense Refill   Blood Pressure Monitoring (BLOOD PRESSURE CUFF) MISC Take BP once daily. 1 each 0   doxycycline (VIBRAMYCIN) 100 MG capsule Take 1 capsule (100 mg total) by mouth 2 (two) times daily. 20 capsule 0   hydrochlorothiazide (HYDRODIURIL) 12.5 MG tablet Take 1 tablet (12.5 mg total) by mouth daily. 90 tablet 3   No current facility-administered medications on file prior to visit.    Allergies  Allergen Reactions   Penicillins Hives    Social History   Socioeconomic History   Marital status: Single    Spouse name: Not on file   Number of children: Not on  file   Years of education: Not on file   Highest education level: Associate degree: academic program  Occupational History   Not on file  Tobacco Use   Smoking status: Former    Current packs/day: 0.00    Types: Cigarettes    Quit date: 07/21/2018    Years since quitting: 4.8   Smokeless tobacco: Never  Vaping Use   Vaping status: Never Used  Substance and Sexual Activity   Alcohol use: Yes    Alcohol/week: 7.0 standard drinks of alcohol    Types: 7 Cans of beer per week     Comment: occ   Drug use: No   Sexual activity: Yes    Birth control/protection: None  Other Topics Concern   Not on file  Social History Narrative   Not on file   Social Drivers of Health   Financial Resource Strain: Medium Risk (06/13/2023)   Overall Financial Resource Strain (CARDIA)    Difficulty of Paying Living Expenses: Somewhat hard  Food Insecurity: Food Insecurity Present (06/13/2023)   Hunger Vital Sign    Worried About Running Out of Food in the Last Year: Never true    Ran Out of Food in the Last Year: Sometimes true  Transportation Needs: No Transportation Needs (06/13/2023)   PRAPARE - Administrator, Civil Service (Medical): No    Lack of Transportation (Non-Medical): No  Physical Activity: Inactive (06/13/2023)   Exercise Vital Sign    Days of Exercise per Week: 0 days    Minutes of Exercise per Session: 0 min  Stress: Stress Concern Present (06/13/2023)   [employer] of Occupational Health - Occupational Stress Questionnaire    Feeling of Stress : To some extent  Social Connections: Moderately Integrated (06/13/2023)   Social Connection and Isolation Panel [NHANES]    Frequency of Communication with Friends and Family: More than three times a week    Frequency of Social Gatherings with Friends and Family: Never    Attends Religious Services: More than 4 times per year    Active Member of [employer] or Organizations: Yes    Attends Banker Meetings: Never    Marital Status: Never married  Intimate Partner Violence: Not At Risk (06/13/2023)   Humiliation, Afraid, Rape, and Kick questionnaire    Fear of Current or Ex-Partner: No    Emotionally Abused: No    Physically Abused: No    Sexually Abused: No    Family History  Problem Relation Age of Onset   Hypertension Mother     Past Surgical History:  Procedure Laterality Date   CESAREAN SECTION     CESAREAN SECTION N/A 09/02/2016   Procedure: CESAREAN SECTION;  Surgeon: Lesly Dukes, MD;  Location: [facility] BIRTHING SUITES;  Service: Obstetrics;  Laterality: N/A;   CESAREAN SECTION N/A 02/02/2019   Procedure: CESAREAN SECTION;  Surgeon: Levie Heritage, DO;  Location: [facility] LD ORS;  Service: Obstetrics;  Laterality: N/A;   CESAREAN SECTION N/A    Phreesia 10/12/2019   CESAREAN SECTION N/A 05/09/2020   Procedure: CESAREAN SECTION;  Surgeon: Venora Maples, MD;  Location: [facility] LD ORS;  Service: Obstetrics;  Laterality: N/A;    ROS: Review of Systems Negative except as stated above  PHYSICAL EXAM: BP 133/84 (BP Location: Left Arm, Patient Position: Sitting, Cuff Size: Large)   Pulse 80   Temp 98 F (36.7 C) (Oral)   Ht 5\' 8"  (1.727 m)   Wt (!) 395 lb (179.2  kg)   LMP 05/17/2023 (Approximate)   SpO2 99%   BMI 60.06 kg/m   Wt Readings from Last 3 Encounters:  06/13/23 (!) 395 lb (179.2 kg)  05/31/23 (!) 360 lb (163.3 kg)  03/13/23 (!) 392 lb 11.2 oz (178.1 kg)    Physical Exam  General appearance - alert, well appearing, young obese African-American female and in no distress Mental status - normal mood, behavior, speech, dress, motor activity, and thought processes Chest - clear to auscultation, no wheezes, rales or rhonchi, symmetric air entry Heart - normal rate, regular rhythm, normal S1, S2, no murmurs, rubs, clicks or gallops Extremities - peripheral pulses normal, no pedal edema, no clubbing or cyanosis     06/13/2023    4:09 PM 12/28/2022    9:00 AM 05/02/2020    4:55 PM  Depression screen PHQ 2/9  Decreased Interest 1 1 1   Down, Depressed, Hopeless 1 2 0  PHQ - 2 Score 2 3 1   Altered sleeping 3 3 0  Tired, decreased energy 1 3 1   Change in appetite 0 3 0  Feeling bad or failure about yourself  0 0 0  Trouble concentrating 0 0 0  Moving slowly or fidgety/restless 0 0 0  Suicidal thoughts 0 0 0  PHQ-9 Score 6 12 2   Difficult doing work/chores Somewhat difficult        06/13/2023    4:09 PM 12/28/2022    9:00 AM 05/02/2020    4:55 PM  03/10/2020    8:12 AM  GAD 7 : Generalized Anxiety Score  Nervous, Anxious, on Edge 1 3 2 1   Control/stop worrying 3 2 0 0  Worry too much - different things 3 1 0 0  Trouble relaxing 3 3 0 0  Restless 0 0 0 0  Easily annoyed or irritable 3 3 2 3   Afraid - awful might happen 1 1 0 0  Total GAD 7 Score 14 13 4 4   Anxiety Difficulty Somewhat difficult           Latest Ref Rng & Units 12/28/2022   10:23 AM 05/11/2020    8:22 AM 05/06/2020   12:00 PM  CMP  Glucose 70 - 99 mg/dL 91  73  86   BUN 6 - 20 mg/dL 8  8  9    Creatinine 0.57 - 1.00 mg/dL 2.84  1.32  4.40   Sodium 134 - 144 mmol/L 140  136  136   Potassium 3.5 - 5.2 mmol/L 4.5  3.9  3.8   Chloride 96 - 106 mmol/L 104  106  105   CO2 20 - 29 mmol/L 22  21  20    Calcium 8.7 - 10.2 mg/dL 9.6  8.5  9.6   Total Protein 6.0 - 8.5 g/dL 6.8  5.4  6.3   Total Bilirubin 0.0 - 1.2 mg/dL <1.0  0.4  0.3   Alkaline Phos 44 - 121 IU/L 68  48  56   AST 0 - 40 IU/L 12  17  12    ALT 0 - 32 IU/L 20  16  15     Lipid Panel     Component Value Date/Time   CHOL 200 (H) 12/28/2022 1023   TRIG 129 12/28/2022 1023   HDL 67 12/28/2022 1023   CHOLHDL 3.0 12/28/2022 1023   LDLCALC 110 (H) 12/28/2022 1023    CBC    Component Value Date/Time   WBC 9.2 12/28/2022 1023   WBC 16.4 (H) 05/10/2020 0525  RBC 5.38 (H) 12/28/2022 1023   RBC 3.77 (L) 05/10/2020 0525   HGB 13.2 12/28/2022 1023   HCT 42.2 12/28/2022 1023   PLT 543 (H) 12/28/2022 1023   MCV 78 (L) 12/28/2022 1023   MCH 24.5 (L) 12/28/2022 1023   MCH 29.2 05/10/2020 0525   MCHC 31.3 (L) 12/28/2022 1023   MCHC 34.1 05/10/2020 0525   RDW 14.8 12/28/2022 1023   LYMPHSABS 1.9 03/09/2020 1634   MONOABS 0.8 05/29/2017 1947   EOSABS 0.1 03/09/2020 1634   BASOSABS 0.0 03/09/2020 1634    ASSESSMENT AND PLAN: 1. Essential hypertension (Primary) Blood pressure still above goal (130/80).  We agreed to increase amlodipine to 10 mg daily.  Continue hydrochlorothiazide 12.5 mg daily. -  amLODipine (NORVASC) 10 MG tablet; Take 1 tablet (10 mg total) by mouth daily.  Dispense: 30 tablet; Refill: 4  2. GAD (generalized anxiety disorder) Patient reports some increased anxiety and worrying over the past several months.  She felt Zoloft helped initially.  We discussed increasing the dose to 50 mg daily.  She was agreeable to this.  She was also interested in Xanax which she was [place] by [place] PCP [place].  Advised patient that we do not prescribe that through our practice but I can refer her again to behavioral health for CBT and medication management.  She is agreeable to the referral.  Discussed ways to help manage stress including going for walks, listening to music, meditation etc. - sertraline (ZOLOFT) 50 MG tablet; Take 1 tablet (50 mg total) by mouth daily.  Dispense: 30 tablet; Refill: 4 - Ambulatory referral to Psychiatry  3. Morbid obesity with body mass index (BMI) of 60.0 to 69.9 in adult [facility]) She has had minimal weight loss so far on the Wegovy 0.5 mg.  We discussed increasing it to 1 mg dose.  Advised her to watch out for side effects including vomiting, abdominal pain, severe diarrhea or constipation.  If these occur she should stop the medicine and come in.   - Semaglutide-Weight Management (WEGOVY) 1 MG/0.5ML SOAJ; Inject 1 mg into the skin once a week.  Dispense: 2 mL; Refill: 1    Hypertension Consistent use of Amlodipine 5mg  and Hydrochlorothiazide 12.5mg . Reports feeling better since starting medication. Blood pressure still above goal (130/80). -Check blood pressure today. If still elevated, consider increasing Amlodipine dose.   Patient was given the opportunity to ask questions.  Patient verbalized understanding of the plan and was able to repeat key elements of the plan.   This documentation was completed using Paediatric nurse.  Any transcriptional errors are unintentional.  Orders Placed This Encounter   Procedures   Ambulatory referral to Psychiatry     Requested Prescriptions   Signed Prescriptions Disp Refills   sertraline (ZOLOFT) 50 MG tablet 30 tablet 4    Sig: Take 1 tablet (50 mg total) by mouth daily.   Semaglutide-Weight Management (WEGOVY) 1 MG/0.5ML SOAJ 2 mL 1    Sig: Inject 1 mg into the skin once a week.   amLODipine (NORVASC) 10 MG tablet 30 tablet 4    Sig: Take 1 tablet (10 mg total) by mouth daily.    Return in about 4 months (around 10/11/2023).  Jonah Blue, MD, FACP

## 2023-06-27 ENCOUNTER — Other Ambulatory Visit: Payer: Self-pay | Admitting: Internal Medicine

## 2023-06-27 DIAGNOSIS — F411 Generalized anxiety disorder: Secondary | ICD-10-CM

## 2023-06-28 ENCOUNTER — Other Ambulatory Visit: Payer: Self-pay | Admitting: Internal Medicine

## 2023-06-28 DIAGNOSIS — F320 Major depressive disorder, single episode, mild: Secondary | ICD-10-CM

## 2023-06-28 DIAGNOSIS — F411 Generalized anxiety disorder: Secondary | ICD-10-CM

## 2023-06-28 NOTE — Telephone Encounter (Signed)
 Requested by interface surescripts. Medication dose discontinued 06/13/23.  Requested Prescriptions  Refused Prescriptions Disp Refills   sertraline  (ZOLOFT ) 25 MG tablet [Pharmacy Med Name: SERTRALINE  HCL 25 MG TABLET] 30 tablet 0    Sig: TAKE 1 TABLET (25 MG TOTAL) BY MOUTH DAILY.     Psychiatry:  Antidepressants - SSRI - sertraline  Passed - 06/28/2023  8:44 AM      Passed - AST in normal range and within 360 days    AST  Date Value Ref Range Status  12/28/2022 12 0 - 40 IU/L Final         Passed - ALT in normal range and within 360 days    ALT  Date Value Ref Range Status  12/28/2022 20 0 - 32 IU/L Final         Passed - Completed PHQ-2 or PHQ-9 in the last 360 days      Passed - Valid encounter within last 6 months    Recent Outpatient Visits           2 weeks ago Essential hypertension   [facility] [place]. [facility] [name], MD   6 months ago Establishing care with new doctor, encounter for   [facility] [place]. [facility] [name], MD   3 years ago Encounter to establish care   [facility], [name], DO       Future Appointments             In 3 months [name], MD [facility] Of [place]. [facility]

## 2023-07-22 ENCOUNTER — Telehealth: Payer: Self-pay

## 2023-07-22 ENCOUNTER — Other Ambulatory Visit: Payer: Self-pay

## 2023-07-22 MED ORDER — WEGOVY 1 MG/0.5ML ~~LOC~~ SOAJ: 1.0000 mg | SUBCUTANEOUS | 2 refills | Status: DC

## 2023-07-22 NOTE — Telephone Encounter (Signed)
 Let pt know that prior approval will be submitted to her insurance and she will be notified once we get a response.

## 2023-07-22 NOTE — Telephone Encounter (Signed)
Can you help with this PA?

## 2023-07-22 NOTE — Addendum Note (Signed)
 Addended by: Jonah Blue B on: 07/22/2023 09:48 PM   Modules accepted: Orders

## 2023-07-22 NOTE — Telephone Encounter (Signed)
 Copied from CRM [id number]. Topic: Clinical - Prescription Issue >> Jul 22, 2023 11:45 AM Dennison Nancy wrote: Reason for CRM: Patient stated that she reached out to the pharmacist and they need a prior authorization for the  Semaglutide-Weight Management [facility]) 1 MG/0.5ML [facility]  [id number] G[place] Otto, [place] - [address] AT [facility] OF [address] [place] [address] Phone: [phone] Fax: [phone] Hours: Not open 24 hours  Patient been out the medication for about 3 weeks  Patient stated her insurance approved up until march  Please contact patient at [phone]

## 2023-07-23 NOTE — Telephone Encounter (Signed)
 Called but no answer. LVM to call back.

## 2023-07-26 ENCOUNTER — Telehealth: Admitting: Physician Assistant

## 2023-07-26 DIAGNOSIS — R6889 Other general symptoms and signs: Secondary | ICD-10-CM | POA: Diagnosis not present

## 2023-07-26 MED ORDER — OSELTAMIVIR PHOSPHATE 75 MG PO CAPS: 75.0000 mg | ORAL_CAPSULE | Freq: Two times a day (BID) | ORAL | 0 refills | Status: DC

## 2023-07-26 MED ORDER — PROMETHAZINE-DM 6.25-15 MG/5ML PO SYRP: 5.0000 mL | ORAL_SOLUTION | Freq: Four times a day (QID) | ORAL | 0 refills | Status: DC | PRN

## 2023-07-26 MED ORDER — ALBUTEROL SULFATE HFA 108 (90 BASE) MCG/ACT IN AERS
1.0000 | INHALATION_SPRAY | Freq: Four times a day (QID) | RESPIRATORY_TRACT | 0 refills | Status: DC | PRN
Start: 2023-07-26 — End: 2023-09-25

## 2023-07-26 NOTE — Progress Notes (Signed)
 Virtual Visit Consent   Terri Tran, you are scheduled for a virtual visit with a [facility] provider today. Just as with appointments in the office, your consent must be obtained to participate. Your consent will be active for this visit and any virtual visit you may have with one of our providers in the next 365 days. If you have a MyChart account, a copy of this consent can be sent to you electronically.  As this is a virtual visit, video technology does not allow for your provider to perform a traditional examination. This may limit your provider's ability to fully assess your condition. If your provider identifies any concerns that need to be evaluated in person or the need to arrange testing (such as labs, EKG, etc.), we will make arrangements to do so. Although advances in technology are sophisticated, we cannot ensure that it will always work on either your end or our end. If the connection with a video visit is poor, the visit may have to be switched to a telephone visit. With either a video or telephone visit, we are not always able to ensure that we have a secure connection.  By engaging in this virtual visit, you consent to the provision of healthcare and authorize for your insurance to be billed (if applicable) for the services provided during this visit. Depending on your insurance coverage, you may receive a charge related to this service.  I need to obtain your verbal consent now. Are you willing to proceed with your visit today? Terri Tran has provided verbal consent on 07/26/2023 for a virtual visit (video or telephone). Margaretann Loveless, PA-C  Date: 07/26/2023 2:18 PM   Virtual Visit via Video Note   I, Margaretann Loveless, connected with  Terri Tran  ([id number], 12-07-1990) on 07/26/23 at  2:15 PM EST by a video-enabled telemedicine application and verified that I am speaking with the correct person using two identifiers.  Location: Patient: Virtual Visit Location  Patient: Home Provider: Virtual Visit Location Provider: Home Office   I discussed the limitations of evaluation and management by telemedicine and the availability of in person appointments. The patient expressed understanding and agreed to proceed.    History of Present Illness: Terri Tran is a 33 y.o. who identifies as a female who was assigned female at birth, and is being seen today for cough.  HPI: Cough This is a new problem. The current episode started yesterday. The problem has been unchanged. The problem occurs every few minutes. The cough is Productive of sputum and productive of purulent sputum. Associated symptoms include chills, headaches, myalgias, nasal congestion, postnasal drip, rhinorrhea, a sore throat (from coughing) and wheezing (mild). Pertinent negatives include no ear congestion, ear pain, fever or sweats. The symptoms are aggravated by lying down and cold air. Treatments tried: robitussin. The treatment provided no relief.  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   History of cesarean section 05/09/2020   Cesarean delivery delivered 05/09/2020   History of bilateral tubal ligation 05/09/2020   History of cesarean delivery 03/15/2020   Depression 10/14/2019   Generalized anxiety disorder 10/14/2019   History of GBS bacteriuria 09/13/2018   Gestational hypertension 08/31/2016   Obesity in pregnancy 08/28/2016   Morbid obesity with body mass index (BMI) of 60.0 to 69.9 in adult [facility]) 08/28/2016  Allergies:  Allergies  Allergen Reactions   Penicillins Hives   Medications:  Current Outpatient Medications:    albuterol (VENTOLIN HFA) 108 (90 Base) MCG/ACT inhaler, Inhale 1-2 puffs into the lungs every 6 (six) hours as needed., Disp: 8 g, Rfl: 0   oseltamivir (TAMIFLU) 75 MG capsule, Take 1 capsule (75 mg total) by mouth 2  (two) times daily., Disp: 10 capsule, Rfl: 0   promethazine-dextromethorphan (PROMETHAZINE-DM) 6.25-15 MG/5ML syrup, Take 5 mLs by mouth 4 (four) times daily as needed., Disp: 118 mL, Rfl: 0   amLODipine (NORVASC) 10 MG tablet, Take 1 tablet (10 mg total) by mouth daily., Disp: 30 tablet, Rfl: 4   Blood Pressure Monitoring (BLOOD PRESSURE CUFF) MISC, Take BP once daily., Disp: 1 each, Rfl: 0   doxycycline (VIBRAMYCIN) 100 MG capsule, Take 1 capsule (100 mg total) by mouth 2 (two) times daily., Disp: 20 capsule, Rfl: 0   hydrochlorothiazide (HYDRODIURIL) 12.5 MG tablet, Take 1 tablet (12.5 mg total) by mouth daily., Disp: 90 tablet, Rfl: 3   Semaglutide-Weight Management (WEGOVY) 1 MG/0.5ML SOAJ, Inject 1 mg into the skin once a week., Disp: 2 mL, Rfl: 2   sertraline (ZOLOFT) 50 MG tablet, TAKE 1 TABLET BY MOUTH EVERY DAY, Disp: 90 tablet, Rfl: 1  Observations/Objective: Patient is well-developed, well-nourished in no acute distress.  Resting comfortably at home.  Head is normocephalic, atraumatic.  No labored breathing.  Speech is clear and coherent with logical content.  Patient is alert and oriented at baseline.    Assessment and Plan: 1. Flu-like symptoms (Primary) - oseltamivir (TAMIFLU) 75 MG capsule; Take 1 capsule (75 mg total) by mouth 2 (two) times daily.  Dispense: 10 capsule; Refill: 0 - promethazine-dextromethorphan (PROMETHAZINE-DM) 6.25-15 MG/5ML syrup; Take 5 mLs by mouth 4 (four) times daily as needed.  Dispense: 118 mL; Refill: 0 - albuterol (VENTOLIN HFA) 108 (90 Base) MCG/ACT inhaler; Inhale 1-2 puffs into the lungs every 6 (six) hours as needed.  Dispense: 8 g; Refill: 0  - Suspect influenza due to symptoms and positive exposure - Tamiflu prescribed - Promethazine DM for cough - Albuterol for wheezing - Continue OTC medication of choice for symptomatic management - Push fluids - Rest - Seek in person evaluation if symptoms worsen or fail to improve   Follow Up  Instructions: I discussed the assessment and treatment plan with the patient. The patient was provided an opportunity to ask questions and all were answered. The patient agreed with the plan and demonstrated an understanding of the instructions.  A copy of instructions were sent to the patient via MyChart unless otherwise noted below.    The patient was advised to call back or seek an in-person evaluation if the symptoms worsen or if the condition fails to improve as anticipated.    Margaretann Loveless, PA-C

## 2023-07-26 NOTE — Patient Instructions (Signed)
 Terri Tran, thank you for joining Margaretann Loveless, PA-C for today's virtual visit.  While this provider is not your primary care provider (PCP), if your PCP is located in our provider database this encounter information will be shared with them immediately following your visit.   A [facility] MyChart account gives you access to today's visit and all your visits, tests, and labs performed at [facility] " click here if you don't have a [facility] MyChart account or go to [email or web address]  Consent: (Patient) Terri Tran provided verbal consent for this virtual visit at the beginning of the encounter.  Current Medications:  Current Outpatient Medications:    albuterol (VENTOLIN HFA) 108 (90 Base) MCG/ACT inhaler, Inhale 1-2 puffs into the lungs every 6 (six) hours as needed., Disp: 8 g, Rfl: 0   oseltamivir (TAMIFLU) 75 MG capsule, Take 1 capsule (75 mg total) by mouth 2 (two) times daily., Disp: 10 capsule, Rfl: 0   promethazine-dextromethorphan (PROMETHAZINE-DM) 6.25-15 MG/5ML syrup, Take 5 mLs by mouth 4 (four) times daily as needed., Disp: 118 mL, Rfl: 0   amLODipine (NORVASC) 10 MG tablet, Take 1 tablet (10 mg total) by mouth daily., Disp: 30 tablet, Rfl: 4   Blood Pressure Monitoring (BLOOD PRESSURE CUFF) MISC, Take BP once daily., Disp: 1 each, Rfl: 0   doxycycline (VIBRAMYCIN) 100 MG capsule, Take 1 capsule (100 mg total) by mouth 2 (two) times daily., Disp: 20 capsule, Rfl: 0   hydrochlorothiazide (HYDRODIURIL) 12.5 MG tablet, Take 1 tablet (12.5 mg total) by mouth daily., Disp: 90 tablet, Rfl: 3   Semaglutide-Weight Management (WEGOVY) 1 MG/0.5ML SOAJ, Inject 1 mg into the skin once a week., Disp: 2 mL, Rfl: 2   sertraline (ZOLOFT) 50 MG tablet, TAKE 1 TABLET BY MOUTH EVERY DAY, Disp: 90 tablet, Rfl: 1   Medications ordered in this encounter:  Meds ordered this encounter  Medications   oseltamivir (TAMIFLU) 75 MG capsule    Sig: Take 1 capsule (75 mg  total) by mouth 2 (two) times daily.    Dispense:  10 capsule    Refill:  0    Supervising Provider:   Merrilee Jansky [[id number]]   promethazine-dextromethorphan (PROMETHAZINE-DM) 6.25-15 MG/5ML syrup    Sig: Take 5 mLs by mouth 4 (four) times daily as needed.    Dispense:  118 mL    Refill:  0    Supervising Provider:   Merrilee Jansky [[id number]]   albuterol (VENTOLIN HFA) 108 (90 Base) MCG/ACT inhaler    Sig: Inhale 1-2 puffs into the lungs every 6 (six) hours as needed.    Dispense:  8 g    Refill:  0    Supervising Provider:   Merrilee Jansky [[id number]]     *If you need refills on other medications prior to your next appointment, please contact your pharmacy*  Follow-Up: Call back or seek an in-person evaluation if the symptoms worsen or if the condition fails to improve as anticipated.  [facility] [phone]  Other Instructions  Influenza, Adult Influenza is also called the flu. It's an infection that affects your respiratory tract. This includes your nose, throat, windpipe, and lungs. The flu is contagious. This means it spreads easily from person to person. It causes symptoms that are like a cold. It can also cause a high fever and body aches. What are the causes? The flu is caused by the influenza virus. You can get it by: Breathing in droplets that  are in the air after an infected person coughs or sneezes. Touching something that has the virus on it and then touching your mouth, nose, or eyes. What increases the risk? You may be more likely to get the flu if: You don't wash your hands often. You're near a lot of people during cold and flu season. You touch your mouth, eyes, or nose without washing your hands first. You don't get a flu shot each year. You may also be more at risk for the flu and serious problems, such as a lung infection called pneumonia, if: You're older than 65. You're pregnant. Your immune system is weak. Your immune system is  your body's defense system. You have a long-term, or chronic, condition, such as: Heart, kidney, or lung disease. Diabetes. A liver disorder. Asthma. You're very overweight. You have anemia. This is when you don't have enough red blood cells in your body. What are the signs or symptoms? Flu symptoms often start all of a sudden. They may last 4-14 days and include: Fever and chills. Headaches, body aches, or muscle aches. Sore throat. Cough. Runny or stuffy nose. Discomfort in your chest. Not wanting to eat as much as normal. Feeling weak or tired. Feeling dizzy. Nausea or vomiting. How is this diagnosed? The flu may be diagnosed based on your symptoms and medical history. You may also have a physical exam. A swab may be taken from your nose or throat and tested for the virus. How is this treated? If the flu is found early, you can be treated with antiviral medicine. This may be given to you by mouth or through an IV. It can help you feel less sick and get better faster. Taking care of yourself at home can also help your symptoms get better. Your health care provider may tell you to: Take over-the-counter medicines. Drink lots of fluids. The flu often goes away on its own. If you have very bad symptoms or problems caused by the flu, you may need to be treated in a hospital. Follow these instructions at home: Activity Rest as needed. Get lots of sleep. Stay home from work or school as told by your provider. Leave home only to go see your provider. Do not leave home for other reasons until you don't have a fever for 24 hours without taking medicine. Eating and drinking Take an oral rehydration solution (ORS). This is a drink that is sold at pharmacies and stores. Drink enough fluid to keep your pee pale yellow. Try to drink small amounts of clear fluids. These include water, ice chips, fruit juice mixed with water, and low-calorie sports drinks. Try to eat bland foods that are easy  to digest. These include bananas, applesauce, rice, lean meats, toast, and crackers. Avoid drinks that have a lot of sugar or caffeine in them. These include energy drinks, regular sports drinks, and soda. Do not drink alcohol. Do not eat spicy or fatty foods. General instructions     Take your medicines only as told by your provider. Use a cool mist humidifier to add moisture to the air in your home. This can make it easier for you to breathe. You should also clean the humidifier every day. To do so: Empty the water. Pour clean water in. Cover your mouth and nose when you cough or sneeze. Wash your hands with soap and water often and for at least 20 seconds. It's extra important to do so after you cough or sneeze. If you can't use  soap and water, use hand sanitizer. How is this prevented?  Get a flu shot every year. Ask your provider when you should get your flu shot. Stay away from people who are sick during fall and winter. Fall and winter are cold and flu season. Contact a health care provider if: You get new symptoms. You have chest pain. You have watery poop, also called diarrhea. You have a fever. Your cough gets worse. You start to have more mucus. You feel like you may vomit, or you vomit. Get help right away if: You become short of breath or have trouble breathing. Your skin or nails turn blue. You have very bad pain or stiffness in your neck. You get a sudden headache or pain in your face or ear. You vomit each time you eat or drink. These symptoms may be an emergency. Call 911 right away. Do not wait to see if the symptoms will go away. Do not drive yourself to the hospital. This information is not intended to replace advice given to you by your health care provider. Make sure you discuss any questions you have with your health care provider. Document Revised: 02/07/2023 Document Reviewed: 06/14/2022 Elsevier Patient Education  2024 Elsevier Inc.   If you have  been instructed to have an in-person evaluation today at a local Urgent Care facility, please use the link below. It will take you to a list of all of our available [facility], including address, phone number and hours of operation. Please do not delay care.  [facility]  If you or a family member do not have a primary care provider, use the link below to schedule a visit and establish care. When you choose a [place] primary care physician or advanced practice provider, you gain a long-term partner in health. Find a Primary Care Provider  Learn more about [facility]'s in-office and virtual care options: [place] - Get Care Now

## 2023-08-02 ENCOUNTER — Other Ambulatory Visit: Payer: Self-pay

## 2023-08-02 ENCOUNTER — Telehealth: Payer: Self-pay

## 2023-08-02 NOTE — Telephone Encounter (Signed)
 Copied from CRM [id number]. Topic: Clinical - Prescription Issue >> Aug 02, 2023 12:24 PM Izetta Dakin wrote: Reason for CRM: Patient states that Terri Tran was denied due to not having information submitted to insurance. Request to have information resubmitted to insurance.

## 2023-08-09 ENCOUNTER — Encounter (HOSPITAL_COMMUNITY): Payer: Self-pay

## 2023-08-12 ENCOUNTER — Other Ambulatory Visit: Payer: Self-pay

## 2023-08-12 ENCOUNTER — Telehealth: Payer: Self-pay

## 2023-08-12 ENCOUNTER — Ambulatory Visit (HOSPITAL_COMMUNITY): Payer: Medicaid Other | Admitting: Psychiatry

## 2023-08-12 NOTE — Telephone Encounter (Signed)
 SUBMITTED WEGOVY PRIOR APPROVAL APPEAL TODAY. FAXED TO [facility] MEDICAID [phone]

## 2023-08-19 ENCOUNTER — Other Ambulatory Visit: Payer: Self-pay
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
## 2023-09-25 ENCOUNTER — Ambulatory Visit (HOSPITAL_COMMUNITY): Admitting: Psychiatry

## 2023-09-25 ENCOUNTER — Encounter (HOSPITAL_COMMUNITY): Payer: Self-pay | Admitting: Psychiatry

## 2023-09-25 DIAGNOSIS — F411 Generalized anxiety disorder: Secondary | ICD-10-CM

## 2023-09-25 DIAGNOSIS — F410 Panic disorder [episodic paroxysmal anxiety] without agoraphobia: Secondary | ICD-10-CM | POA: Diagnosis not present

## 2023-09-25 DIAGNOSIS — F3341 Major depressive disorder, recurrent, in partial remission: Secondary | ICD-10-CM

## 2023-09-25 DIAGNOSIS — Z9189 Other specified personal risk factors, not elsewhere classified: Secondary | ICD-10-CM | POA: Diagnosis not present

## 2023-09-25 DIAGNOSIS — F4310 Post-traumatic stress disorder, unspecified: Secondary | ICD-10-CM | POA: Diagnosis not present

## 2023-09-25 MED ORDER — HYDROXYZINE HCL 25 MG PO TABS
12.5000 mg | ORAL_TABLET | Freq: Two times a day (BID) | ORAL | 2 refills | Status: DC | PRN
Start: 2023-09-25 — End: 2023-11-21

## 2023-09-25 MED ORDER — SERTRALINE HCL 100 MG PO TABS
100.0000 mg | ORAL_TABLET | Freq: Every day | ORAL | 2 refills | Status: DC
Start: 2023-09-25 — End: 2023-10-11

## 2023-09-25 NOTE — Patient Instructions (Signed)
 Thank you for attending your appointment today.  -- INCREASE Zoloft  to 100 mg daily -- START Atarax  12.5-25 mg twice daily as needed for panic attacks/sleep -- Continue other medications as prescribed.  Please do not make any changes to medications without first discussing with your provider. If you are experiencing a psychiatric emergency, please call 911 or present to your nearest emergency department. Additional crisis, medication management, and therapy resources are included below.  [facility]  [address], [place], [place] [address] [phone] WALK-IN URGENT CARE 24/7 FOR ANYONE [address] [place], [place]  [phone] Fax: [phone] [email or web address] *Interpreters available *Accepts all insurance and uninsured for Urgent Care needs *Accepts Medicaid and uninsured for outpatient treatment (below)      ONLY FOR [facility]  Below:    Outpatient New Patient Assessment/Therapy Walk-ins:        Monday, Wednesday, and Thursday 8am until slots are full (first come, first served)                   New Patient Psychiatry/Medication Management        Monday-Friday 8am-11am (first come, first served)               For all walk-ins we ask that you arrive by 7:15am, because patients will be seen in the order of arrival.

## 2023-10-04 ENCOUNTER — Other Ambulatory Visit (HOSPITAL_COMMUNITY): Payer: Self-pay | Admitting: Psychiatry

## 2023-10-11 ENCOUNTER — Other Ambulatory Visit (HOSPITAL_COMMUNITY)
Admission: RE | Admit: 2023-10-11 | Discharge: 2023-10-11 | Disposition: A | Discharge status: Home / Self Care | Source: Ambulatory Visit | Attending: Internal Medicine | Admitting: Internal Medicine

## 2023-10-11 ENCOUNTER — Ambulatory Visit: Payer: Medicaid Other | Attending: Internal Medicine | Admitting: Internal Medicine

## 2023-10-11 VITALS — BP 130/69 | HR 86 | Temp 97.7°F | Ht 68.0 in | Wt 377.0 lb

## 2023-10-11 DIAGNOSIS — I10 Essential (primary) hypertension: Secondary | ICD-10-CM

## 2023-10-11 DIAGNOSIS — Z113 Encounter for screening for infections with a predominantly sexual mode of transmission: Secondary | ICD-10-CM | POA: Diagnosis present

## 2023-10-11 DIAGNOSIS — Z6841 Body Mass Index (BMI) 40.0 and over, adult: Secondary | ICD-10-CM

## 2023-10-11 DIAGNOSIS — F411 Generalized anxiety disorder: Secondary | ICD-10-CM | POA: Diagnosis not present

## 2023-10-11 MED ORDER — AMLODIPINE BESYLATE 10 MG PO TABS: 10.0000 mg | ORAL_TABLET | Freq: Every day | ORAL | 4 refills | Status: AC

## 2023-10-11 MED ORDER — WEGOVY 1.7 MG/0.75ML ~~LOC~~ SOAJ: 1.7000 mg | SUBCUTANEOUS | 2 refills | Status: DC

## 2023-10-11 MED ORDER — HYDROCHLOROTHIAZIDE 12.5 MG PO TABS: 12.5000 mg | ORAL_TABLET | Freq: Every day | ORAL | 3 refills | Status: AC

## 2023-10-11 NOTE — Progress Notes (Signed)
 Patient ID: Terri Tran, female    DOB: [birth year]  MRN: [id number]  CC: Hypertension (HTN f/u. Alois Arnt Wegovy  dosage increase/Fall on 10/08/23 - sore L shoulder)   Subjective: Terri Tran is a 33 y.o. female who presents for chronic ds management. Her 3 young children are with her. Her concerns today include:  Patient with history of HTN, obesity, depression, GAD   Discussed the use of AI scribe software for clinical note transcription with the patient, who gave verbal consent to proceed.  History of Present Illness Terri Tran "Terri Tran" is a 33 year old female who presents for follow-up on weight management and medication review.  She is on Wegovy  1 mg for weight management, taken consistently for two months after a gap due to insurance issues. Initially, she experienced nausea and vomiting occasionally, especially after consuming red meat, which she now avoids. Her appetite has decreased, resulting in an 18-pound weight loss since January. She engages in regular physical activity, including 45 minutes of jump rope every morning and walking two miles daily. She is obtaining a Risk analyst for further exercise.  MDD/GAD: She was left on Zoloft  and referred to behavioral health on last visit.  Since then she has seen behavioral health nurse practitioner and Zoloft  was discontinued.  She was placed on hydroxyzine  which she states works well for her anxiety and also helps her sleep better.  She feels energetic and well-rested in the mornings.  HTN: She takes amlodipine  10 mg daily and hydrochlorothiazide  for blood pressure management.   She is requesting STD screening due to vaginal odor.  Denies any vaginal discharge at this time.    Patient Active Problem List   Diagnosis Date Noted   PTSD (post-traumatic stress disorder) 09/25/2023   Essential hypertension 12/28/2022   Mild major depression (HCC) 12/28/2022   History of cesarean section 05/09/2020   Cesarean delivery  delivered 05/09/2020   History of bilateral tubal ligation 05/09/2020   History of cesarean delivery 03/15/2020   Depression 10/14/2019   Generalized anxiety disorder with panic attacks 10/14/2019   History of GBS bacteriuria 09/13/2018   Gestational hypertension 08/31/2016   Obesity in pregnancy 08/28/2016   Morbid obesity with body mass index (BMI) of 60.0 to 69.9 in adult [facility]) 08/28/2016     Current Outpatient Medications on File Prior to Visit  Medication Sig Dispense Refill   hydrOXYzine  (ATARAX ) 25 MG tablet Take 0.5-1 tablets (12.5-25 mg total) by mouth 2 (two) times daily as needed (panic attacks or sleep). 30 tablet 2   Blood Pressure Monitoring (BLOOD PRESSURE CUFF) MISC Take BP once daily. (Patient not taking: Reported on 10/11/2023) 1 each 0   No current facility-administered medications on file prior to visit.    Allergies  Allergen Reactions   Penicillins Hives    Social History   Socioeconomic History   Marital status: Single    Spouse name: Not on file   Number of children: Not on file   Years of education: Not on file   Highest education level: Associate degree: academic program  Occupational History   Not on file  Tobacco Use   Smoking status: Former    Current packs/day: 0.00    Types: Cigarettes    Quit date: 07/21/2018    Years since quitting: 5.2   Smokeless tobacco: Never  Vaping Use   Vaping status: Never Used  Substance and Sexual Activity   Alcohol use: Not Currently    Alcohol/week: 7.0 standard drinks of  alcohol    Types: 7 Cans of beer per week    Comment: occ   Drug use: No   Sexual activity: Yes    Birth control/protection: None  Other Topics Concern   Not on file  Social History Narrative   Not on file   Social Drivers of Health   Financial Resource Strain: Low Risk  (10/11/2023)   Overall Financial Resource Strain (CARDIA)    Difficulty of Paying Living Expenses: Not very hard  Food Insecurity: Food Insecurity Present  (10/11/2023)   Hunger Vital Sign    Worried About Running Out of Food in the Last Year: Never true    Ran Out of Food in the Last Year: Sometimes true  Transportation Needs: Unmet Transportation Needs (10/11/2023)   PRAPARE - Administrator, Civil Service (Medical): Yes    Lack of Transportation (Non-Medical): No  Physical Activity: Sufficiently Active (10/11/2023)   Exercise Vital Sign    Days of Exercise per Week: 5 days    Minutes of Exercise per Session: 90 min  Stress: No Stress Concern Present (10/11/2023)   [employer] of Occupational Health - Occupational Stress Questionnaire    Feeling of Stress : Not at all  Social Connections: Unknown (10/11/2023)   Social Connection and Isolation Panel [NHANES]    Frequency of Communication with Friends and Family: More than three times a week    Frequency of Social Gatherings with Friends and Family: Twice a week    Attends Religious Services: Patient declined    Database administrator or Organizations: Patient declined    Attends Banker Meetings: Never    Marital Status: Never married  Intimate Partner Violence: Not At Risk (06/13/2023)   Humiliation, Afraid, Rape, and Kick questionnaire    Fear of Current or Ex-Partner: No    Emotionally Abused: No    Physically Abused: No    Sexually Abused: No    Family History  Problem Relation Age of Onset   Hypertension Mother     Past Surgical History:  Procedure Laterality Date   CESAREAN SECTION     CESAREAN SECTION N/A 09/02/2016   Procedure: CESAREAN SECTION;  Surgeon: Rik Chasten, MD;  Location: [facility];  Service: Obstetrics;  Laterality: N/A;   CESAREAN SECTION N/A 02/02/2019   Procedure: CESAREAN SECTION;  Surgeon: Malka Sea, DO;  Location: [facility] LD ORS;  Service: Obstetrics;  Laterality: N/A;   CESAREAN SECTION N/A    Ph[employer] 10/12/2019   CESAREAN SECTION N/A 05/09/2020   Procedure: CESAREAN SECTION;  Surgeon: Teena Feast, MD;   Location: [facility] LD ORS;  Service: Obstetrics;  Laterality: N/A;    ROS: Review of Systems Negative except as stated above  PHYSICAL EXAM: BP 130/69 (BP Location: Right Arm, Patient Position: Sitting, Cuff Size: Large)   Pulse 86   Temp 97.7 F (36.5 C) (Oral)   Ht 5\' 8"  (1.727 m)   Wt (!) 377 lb (171 kg)   SpO2 97%   BMI 57.32 kg/m   Wt Readings from Last 3 Encounters:  10/11/23 (!) 377 lb (171 kg)  06/13/23 (!) 395 lb (179.2 kg)  05/31/23 (!) 360 lb (163.3 kg)    Physical Exam  General appearance - alert, well appearing, and in no distress Mental status - normal mood, behavior, speech, dress, motor activity, and thought processes Chest - clear to auscultation, no wheezes, rales or rhonchi, symmetric air entry Heart - normal rate, regular rhythm, normal  S1, S2, no murmurs, rubs, clicks or gallops Extremities - peripheral pulses normal, no pedal edema, no clubbing or cyanosis      Latest Ref Rng & Units 12/28/2022   10:23 AM 05/11/2020    8:22 AM 05/06/2020   12:00 PM  CMP  Glucose 70 - 99 mg/dL 91  73  86   BUN 6 - 20 mg/dL 8  8  9    Creatinine 0.57 - 1.00 mg/dL 1.61  0.96  0.45   Sodium 134 - 144 mmol/L 140  136  136   Potassium 3.5 - 5.2 mmol/L 4.5  3.9  3.8   Chloride 96 - 106 mmol/L 104  106  105   CO2 20 - 29 mmol/L 22  21  20    Calcium 8.7 - 10.2 mg/dL 9.6  8.5  9.6   Total Protein 6.0 - 8.5 g/dL 6.8  5.4  6.3   Total Bilirubin 0.0 - 1.2 mg/dL <4.0  0.4  0.3   Alkaline Phos 44 - 121 IU/L 68  48  56   AST 0 - 40 IU/L 12  17  12    ALT 0 - 32 IU/L 20  16  15     Lipid Panel     Component Value Date/Time   CHOL 200 (H) 12/28/2022 1023   TRIG 129 12/28/2022 1023   HDL 67 12/28/2022 1023   CHOLHDL 3.0 12/28/2022 1023   LDLCALC 110 (H) 12/28/2022 1023    CBC    Component Value Date/Time   WBC 9.2 12/28/2022 1023   WBC 16.4 (H) 05/10/2020 0525   RBC 5.38 (H) 12/28/2022 1023   RBC 3.77 (L) 05/10/2020 0525   HGB 13.2 12/28/2022 1023   HCT 42.2 12/28/2022  1023   PLT 543 (H) 12/28/2022 1023   MCV 78 (L) 12/28/2022 1023   MCH 24.5 (L) 12/28/2022 1023   MCH 29.2 05/10/2020 0525   MCHC 31.3 (L) 12/28/2022 1023   MCHC 34.1 05/10/2020 0525   RDW 14.8 12/28/2022 1023   LYMPHSABS 1.9 03/09/2020 1634   MONOABS 0.8 05/29/2017 1947   EOSABS 0.1 03/09/2020 1634   BASOSABS 0.0 03/09/2020 1634    ASSESSMENT AND PLAN: 1. Essential hypertension (Primary) At goal.  Continue amlodipine  and HCTZ. - amLODipine  (NORVASC ) 10 MG tablet; Take 1 tablet (10 mg total) by mouth daily.  Dispense: 30 tablet; Refill: 4 - hydrochlorothiazide  (HYDRODIURIL ) 12.5 MG tablet; Take 1 tablet (12.5 mg total) by mouth daily.  Dispense: 90 tablet; Refill: 3  2. Morbid obesity with BMI of 50.0-59.9, adult (HCC) She has done well so far on Wegovy  1 mg.  Encouraged her to continue healthy eating habits and regular exercise.  We discussed increasing the dose of the Wegovy  to 1.7 mg weekly.  She is agreeable to this.  Went over potential side effects to watch out for and report if they occur including recurrent vomiting, severe diarrhea/constipation, abdominal pain, palpitations.  Follow-up in 2 months to see how she is doing. - Semaglutide -Weight Management (WEGOVY ) 1.7 MG/0.75ML SOAJ; Inject 1.7 mg into the skin once a week.  Dispense: 3 mL; Refill: 2  3. Screening examination for STI - Cervicovaginal ancillary only  4. GAD Reports doing much better on hydroxyzine .  Zoloft  has been discontinued by her behavioral health provider.  Patient was given the opportunity to ask questions.  Patient verbalized understanding of the plan and was able to repeat key elements of the plan.   This documentation was completed using Dragon voice recognition  technology.  Any transcriptional errors are unintentional.  No orders of the defined types were placed in this encounter.    Requested Prescriptions   Signed Prescriptions Disp Refills   Semaglutide -Weight Management (WEGOVY ) 1.7  MG/0.75ML SOAJ 3 mL 2    Sig: Inject 1.7 mg into the skin once a week.   amLODipine  (NORVASC ) 10 MG tablet 30 tablet 4    Sig: Take 1 tablet (10 mg total) by mouth daily.   hydrochlorothiazide  (HYDRODIURIL ) 12.5 MG tablet 90 tablet 3    Sig: Take 1 tablet (12.5 mg total) by mouth daily.    Return in about 2 months (around 12/11/2023).  Concetta Dee, MD, FACP

## 2023-10-16 ENCOUNTER — Other Ambulatory Visit: Payer: Self-pay | Admitting: Internal Medicine

## 2023-10-16 ENCOUNTER — Ambulatory Visit: Payer: Self-pay | Admitting: Internal Medicine

## 2023-10-16 LAB — CERVICOVAGINAL ANCILLARY ONLY
Bacterial Vaginitis (gardnerella): POSITIVE — AB
Candida Glabrata: NEGATIVE
Candida Vaginitis: NEGATIVE
Chlamydia: NEGATIVE
Comment: NEGATIVE
Comment: NEGATIVE
Comment: NEGATIVE
Comment: NEGATIVE
Comment: NEGATIVE
Comment: NORMAL
Neisseria Gonorrhea: NEGATIVE
Trichomonas: NEGATIVE

## 2023-10-16 MED ORDER — METRONIDAZOLE 500 MG PO TABS: 500.0000 mg | ORAL_TABLET | Freq: Two times a day (BID) | ORAL | 0 refills | Status: DC
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
## 2023-11-21 ENCOUNTER — Encounter (HOSPITAL_COMMUNITY): Payer: Self-pay | Admitting: Psychiatry

## 2023-11-21 ENCOUNTER — Telehealth (HOSPITAL_COMMUNITY): Admitting: Psychiatry

## 2023-11-21 DIAGNOSIS — F4310 Post-traumatic stress disorder, unspecified: Secondary | ICD-10-CM

## 2023-11-21 DIAGNOSIS — F410 Panic disorder [episodic paroxysmal anxiety] without agoraphobia: Secondary | ICD-10-CM | POA: Diagnosis not present

## 2023-11-21 DIAGNOSIS — F411 Generalized anxiety disorder: Secondary | ICD-10-CM

## 2023-11-21 DIAGNOSIS — F3341 Major depressive disorder, recurrent, in partial remission: Secondary | ICD-10-CM

## 2023-11-21 DIAGNOSIS — F3342 Major depressive disorder, recurrent, in full remission: Secondary | ICD-10-CM | POA: Insufficient documentation

## 2023-11-21 MED ORDER — HYDROXYZINE HCL 25 MG PO TABS: 25.0000 mg | ORAL_TABLET | Freq: Two times a day (BID) | ORAL | 2 refills | Status: DC | PRN

## 2023-11-21 NOTE — Patient Instructions (Signed)

## 2023-12-04 ENCOUNTER — Other Ambulatory Visit (HOSPITAL_COMMUNITY)

## 2023-12-05 ENCOUNTER — Ambulatory Visit (HOSPITAL_COMMUNITY): Admitting: Mental Health

## 2023-12-05 DIAGNOSIS — F3341 Major depressive disorder, recurrent, in partial remission: Secondary | ICD-10-CM

## 2023-12-05 DIAGNOSIS — F411 Generalized anxiety disorder: Secondary | ICD-10-CM

## 2023-12-05 DIAGNOSIS — F410 Panic disorder [episodic paroxysmal anxiety] without agoraphobia: Secondary | ICD-10-CM
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
## 2023-12-12 ENCOUNTER — Ambulatory Visit: Admitting: Internal Medicine

## 2023-12-16 ENCOUNTER — Telehealth: Payer: Self-pay | Admitting: Internal Medicine

## 2023-12-16 NOTE — Telephone Encounter (Signed)
Confirmed appt for 7/29

## 2023-12-17 ENCOUNTER — Ambulatory Visit: Attending: Internal Medicine | Admitting: Internal Medicine

## 2023-12-17 ENCOUNTER — Encounter: Payer: Self-pay | Admitting: Internal Medicine

## 2023-12-17 ENCOUNTER — Other Ambulatory Visit (HOSPITAL_COMMUNITY)
Admission: RE | Admit: 2023-12-17 | Discharge: 2023-12-17 | Disposition: A | Discharge status: Home / Self Care | Source: Ambulatory Visit | Attending: Internal Medicine | Admitting: Internal Medicine

## 2023-12-17 VITALS — BP 122/80 | HR 73 | Ht 68.0 in | Wt 387.0 lb

## 2023-12-17 DIAGNOSIS — I10 Essential (primary) hypertension: Secondary | ICD-10-CM | POA: Diagnosis not present

## 2023-12-17 DIAGNOSIS — Z113 Encounter for screening for infections with a predominantly sexual mode of transmission: Secondary | ICD-10-CM | POA: Diagnosis not present

## 2023-12-17 DIAGNOSIS — L304 Erythema intertrigo: Secondary | ICD-10-CM

## 2023-12-17 DIAGNOSIS — M545 Low back pain, unspecified: Secondary | ICD-10-CM

## 2023-12-17 DIAGNOSIS — Z7985 Long-term (current) use of injectable non-insulin antidiabetic drugs: Secondary | ICD-10-CM | POA: Diagnosis not present

## 2023-12-17 DIAGNOSIS — E65 Localized adiposity: Secondary | ICD-10-CM

## 2023-12-17 DIAGNOSIS — Z6841 Body Mass Index (BMI) 40.0 and over, adult: Secondary | ICD-10-CM

## 2023-12-17 MED ORDER — WEGOVY 2.4 MG/0.75ML ~~LOC~~ SOAJ: 2.4000 mg | SUBCUTANEOUS | 2 refills | Status: DC

## 2023-12-17 NOTE — Patient Instructions (Signed)
 VISIT SUMMARY:  Today, you came in for a follow-up on your weight management. We discussed your current medication, Wegovy , and its effects, as well as your physical activity and dietary habits. We also addressed issues related to your abdominal skin, blood pressure, and a history of bacterial vaginosis.  YOUR PLAN:  -OBESITY: Obesity is a condition characterized by excessive body fat. We will increase your Wegovy  dose to 2.4 mg weekly after your current dose. Continue with smaller portions, stay hydrated with 4-8 glasses of water daily, and focus on protein, plant-based or low-fat yogurt, fruits, and nuts. Follow-up in two months.  -RECURRENT INTERTRIGO OF ABDOMINAL SKIN FOLD: Intertrigo is a rash that occurs in skin folds due to moisture and friction. We will refer you to a plastic surgeon for liposuction and possible abdominoplasty. In the meantime, use nystatin powder to treat any fungal infections.  -HYPERTENSION: Hypertension is high blood pressure. Continue taking your current medications, amlodipine  and hydrochlorothiazide , as prescribed.  -BACTERIAL VAGINOSIS: Bacterial vaginosis is an infection caused by an imbalance of bacteria in the vagina. We will perform a vaginal swab to check for bacterial vaginosis. If the test is positive, your partner should also be treated with metronidazole  and a topical cream.  INSTRUCTIONS:  Please follow up in two months for your weight management. We will also perform a vaginal swab to check for bacterial vaginosis. If you have any questions or concerns, feel free to contact our office.

## 2023-12-17 NOTE — Progress Notes (Signed)
 Patient ID: Terri Tran, female    DOB: 12/29/1990  MRN: [id number]  CC: Hypertension (HTN f/u. Med refill. [facility] referral for sx - requesting tummy tuck, lipo /Requesting to repeat self swab )   Subjective: Terri Tran is a 33 y.o. female who presents for chronic ds management.  Her 2 young boys are with her. Her concerns today include:  Patient with history of HTN, obesity, depression, GAD   Discussed the use of AI scribe software for clinical note transcription with the patient, who gave verbal consent to proceed.  History of Present Illness Terri Tran Terri Tran is a 33 year old female who presents for follow-up on weight management.  Obesity: She is currently on Wegovy , which was increased from 1 mg to 1.7 mg weekly two months ago. She administers the medication on Wednesdays and has one dose left at the current strength. She tolerates the medication well without severe side effects like diarrhea. Wegovy  helps reduce her appetite, leading to smaller portion sizes. However, when not on the medication, she experiences cravings, particularly for sweets. Her weight has increased by 10 pounds since the last visit; she believes this is due to water retention during her menstrual cycle.  She engages in daily physical activity, walking with her children for about an hour to an hour and a half, adjusting for weather conditions. She experiences an increase in appetite as the next dose of Wegovy  approaches.  She is currently taking amlodipine  10 mg and hydrochlorothiazide  12.5 mg for blood pressure management. She describes one of the medications as tasting like candy.  She has excess abdominal skin, which causes irritation and yeast infections, particularly under her C-section scar. She uses Zsorb powder to manage chafing and moisture. She experiences back pain, which she believes is related to her weight and past epidurals.  She has a history of bacterial vaginosis and wishes to confirm  its resolution with another vaginal swab.    Patient Active Problem List   Diagnosis Date Noted   MDD (major depressive disorder), recurrent, in partial remission (HCC) 11/21/2023   PTSD (post-traumatic stress disorder) 09/25/2023   Essential hypertension 12/28/2022   History of cesarean section 05/09/2020   Cesarean delivery delivered 05/09/2020   History of bilateral tubal ligation 05/09/2020   History of cesarean delivery 03/15/2020   Depression 10/14/2019   Generalized anxiety disorder with panic attacks 10/14/2019   History of GBS bacteriuria 09/13/2018   Gestational hypertension 08/31/2016   Obesity in pregnancy 08/28/2016   Morbid obesity with body mass index (BMI) of 60.0 to 69.9 in adult [facility]) 08/28/2016     Current Outpatient Medications on File Prior to Visit  Medication Sig Dispense Refill   amLODipine  (NORVASC ) 10 MG tablet Take 1 tablet (10 mg total) by mouth daily. 30 tablet 4   Blood Pressure Monitoring (BLOOD PRESSURE CUFF) MISC Take BP once daily. 1 each 0   hydrochlorothiazide  (HYDRODIURIL ) 12.5 MG tablet Take 1 tablet (12.5 mg total) by mouth daily. 90 tablet 3   hydrOXYzine  (ATARAX ) 25 MG tablet Take 1 tablet (25 mg total) by mouth 2 (two) times daily as needed (panic attacks or sleep). 60 tablet 2   Semaglutide -Weight Management (WEGOVY ) 1.7 MG/0.75ML SOAJ Inject 1.7 mg into the skin once a week. 3 mL 2   metroNIDAZOLE  (FLAGYL ) 500 MG tablet Take 1 tablet (500 mg total) by mouth 2 (two) times daily. (Patient not taking: Reported on 12/17/2023) 14 tablet 0   No current facility-administered medications on  file prior to visit.    Allergies  Allergen Reactions   Penicillins Hives    Social History   Socioeconomic History   Marital status: Single    Spouse name: Not on file   Number of children: Not on file   Years of education: Not on file   Highest education level: Associate degree: academic program  Occupational History   Not on file  Tobacco Use    Smoking status: Former    Current packs/day: 0.00    Types: Cigarettes    Quit date: 07/21/2018    Years since quitting: 5.4   Smokeless tobacco: Never  Vaping Use   Vaping status: Never Used  Substance and Sexual Activity   Alcohol use: Not Currently    Alcohol/week: 7.0 standard drinks of alcohol    Types: 7 Cans of beer per week    Comment: occ   Drug use: No   Sexual activity: Yes    Birth control/protection: None  Other Topics Concern   Not on file  Social History Narrative   Not on file   Social Drivers of Health   Financial Resource Strain: Low Risk  (10/11/2023)   Overall Financial Resource Strain (CARDIA)    Difficulty of Paying Living Expenses: Not very hard  Food Insecurity: Food Insecurity Present (10/11/2023)   Hunger Vital Sign    Worried About Running Out of Food in the Last Year: Never true    Ran Out of Food in the Last Year: Sometimes true  Transportation Needs: Unmet Transportation Needs (10/11/2023)   PRAPARE - Administrator, Civil Service (Medical): Yes    Lack of Transportation (Non-Medical): No  Physical Activity: Sufficiently Active (10/11/2023)   Exercise Vital Sign    Days of Exercise per Week: 5 days    Minutes of Exercise per Session: 90 min  Stress: No Stress Concern Present (10/11/2023)   [employer] of Occupational Health - Occupational Stress Questionnaire    Feeling of Stress : Not at all  Social Connections: Socially Isolated (12/05/2023)   Social Connection and Isolation Panel    Frequency of Communication with Friends and Family: More than three times a week    Frequency of Social Gatherings with Friends and Family: Never    Attends Religious Services: Never    Database administrator or Organizations: No    Attends Banker Meetings: Never    Marital Status: Never married  Intimate Partner Violence: Not At Risk (06/13/2023)   Humiliation, Afraid, Rape, and Kick questionnaire    Fear of Current or Ex-Partner:  No    Emotionally Abused: No    Physically Abused: No    Sexually Abused: No    Family History  Problem Relation Age of Onset   Hypertension Mother     Past Surgical History:  Procedure Laterality Date   CESAREAN SECTION     CESAREAN SECTION N/A 09/02/2016   Procedure: CESAREAN SECTION;  Surgeon: Burnard VEAR Pate, MD;  Location: [facility] BIRTHING SUIT[facility];  Service: Obstetrics;  Laterality: N/A;   CESAREAN SECTION N/A 02/02/2019   Procedure: CESAREAN SECTION;  Surgeon: Barbra Lang PARAS, DO;  Location: [facility] LD ORS;  Service: Obstetrics;  Laterality: N/A;   CESAREAN SECTION N/A    Phreesia 10/12/2019   CESAREAN SECTION N/A 05/09/2020   Procedure: CESAREAN SECTION;  Surgeon: Lola Donnice HERO, MD;  Location: [facility] LD OR[facility];  Service: Obstetrics;  Laterality: N/A;    ROS: Review of Systems Negative except as  stated above  PHYSICAL EXAM: BP (!) 122/8 (BP Location: Left Arm, Patient Position: Sitting, Cuff Size: Large)   Pulse 73   Ht 5' 8 (1.727 m)   Wt (!) 387 lb (175.5 kg)   SpO2 99%   BMI 58.84 kg/m   Wt Readings from Last 3 Encounters:  12/17/23 (!) 387 lb (175.5 kg)  10/11/23 (!) 377 lb (171 kg)  06/13/23 (!) 395 lb (179.2 kg)    Physical Exam  General appearance - alert, well appearing, and in no distress Mental status - normal mood, behavior, speech, dress, motor activity, and thought processes Abdomen -morbidly obese.  She has abdominal fat that hangs down.  She has mild hyperpigmentation below the abdominal fold.      Latest Ref Rng & Units 12/28/2022   10:23 AM 05/11/2020    8:22 AM 05/06/2020   12:00 PM  CMP  Glucose 70 - 99 mg/dL 91  73  86   BUN 6 - 20 mg/dL 8  8  9    Creatinine 0.57 - 1.00 mg/dL 9.25  9.38  9.31   Sodium 134 - 144 mmol/L 140  136  136   Potassium 3.5 - 5.2 mmol/L 4.5  3.9  3.8   Chloride 96 - 106 mmol/L 104  106  105   CO2 20 - 29 mmol/L 22  21  20    Calcium 8.7 - 10.2 mg/dL 9.6  8.5  9.6   Total Protein 6.0 - 8.5 g/dL 6.8  5.4  6.3   Total  Bilirubin 0.0 - 1.2 mg/dL <9.7  0.4  0.3   Alkaline Phos 44 - 121 IU/L 68  48  56   AST 0 - 40 IU/L 12  17  12    ALT 0 - 32 IU/L 20  16  15     Lipid Panel     Component Value Date/Time   CHOL 200 (H) 12/28/2022 1023   TRIG 129 12/28/2022 1023   HDL 67 12/28/2022 1023   CHOLHDL 3.0 12/28/2022 1023   LDLCALC 110 (H) 12/28/2022 1023    CBC    Component Value Date/Time   WBC 9.2 12/28/2022 1023   WBC 16.4 (H) 05/10/2020 0525   RBC 5.38 (H) 12/28/2022 1023   RBC 3.77 (L) 05/10/2020 0525   HGB 13.2 12/28/2022 1023   HCT 42.2 12/28/2022 1023   PLT 543 (H) 12/28/2022 1023   MCV 78 (L) 12/28/2022 1023   MCH 24.5 (L) 12/28/2022 1023   MCH 29.2 05/10/2020 0525   MCHC 31.3 (L) 12/28/2022 1023   MCHC 34.1 05/10/2020 0525   RDW 14.8 12/28/2022 1023   LYMPHSABS 1.9 03/09/2020 1634   MONOABS 0.8 05/29/2017 1947   EOSABS 0.1 03/09/2020 1634   BASOSABS 0.0 03/09/2020 1634    ASSESSMENT AND PLAN: 1. Morbid obesity with BMI of 50.0-59.9, adult (HCC) (Primary) Weight has increased by 10 pounds since last visit.  She reportedly is still doing okay with her eating habits eating smaller portions and engaging in daily walks.  We will increase the Wegovy  to the maximum dose of 2.4 mg once a week.  Encouraged her to continue eating smaller portions and regular exercise.  Try to avoid skipping meals even if she has to eat just a small protein - Semaglutide -Weight Management (WEGOVY ) 2.4 MG/0.75ML SOAJ; Inject 2.4 mg into the skin once a week.  Dispense: 3 mL; Refill: 2  2. Abdominal obesity Patient with significant excess abdominal skin which leads to recurrent intertrigo and also  contributing to chronic lower back pain.  She would like to be considered for liposuction.  Will refer to plastic surgeon - Ambulatory referral to Plastic Surgery  3. Intertrigo Advised to keep the skin under the abdominal fold clean and dry.  Use nystatin powder. - Ambulatory referral to Plastic Surgery  4. Screening  examination for STI Advised patient that if she comes up positive for BV again, she should have her partner speak with his doctor about treating him for possible BV.  It is thought that treating the partner would help in reducing recurrence - Cervicovaginal ancillary only  5. Essential hypertension At goal.  Continue HCTZ 12.5 mg and amlodipine  10 mg.  Patient was given the opportunity to ask questions.  Patient verbalized understanding of the plan and was able to repeat key elements of the plan.   This documentation was completed using Paediatric nurse.  Any transcriptional errors are unintentional.  Orders Placed This Encounter  Procedures   Ambulatory referral to Plastic Surgery     Requested Prescriptions   Signed Prescriptions Disp Refills   Semaglutide -Weight Management (WEGOVY ) 2.4 MG/0.75ML SOAJ 3 mL 2    Sig: Inject 2.4 mg into the skin once a week.    Return in about 2 months (around 02/17/2024).  Barnie Louder, MD, FACP

## 2023-12-18 ENCOUNTER — Encounter (HOSPITAL_COMMUNITY): Payer: Self-pay

## 2023-12-18 ENCOUNTER — Other Ambulatory Visit (HOSPITAL_COMMUNITY)

## 2023-12-18 LAB — CERVICOVAGINAL ANCILLARY ONLY
Bacterial Vaginitis (gardnerella): NEGATIVE
Candida Glabrata: NEGATIVE
Candida Vaginitis: NEGATIVE
Chlamydia: NEGATIVE
Comment: NEGATIVE
Comment: NEGATIVE
Comment: NEGATIVE
Comment: NEGATIVE
Comment: NEGATIVE
Comment: NORMAL
Neisseria Gonorrhea: NEGATIVE
Trichomonas: NEGATIVE

## 2023-12-19 ENCOUNTER — Ambulatory Visit: Payer: Self-pay | Admitting: Internal Medicine

## 2023-12-25 ENCOUNTER — Institutional Professional Consult (permissible substitution)

## 2023-12-26 ENCOUNTER — Ambulatory Visit (INDEPENDENT_AMBULATORY_CARE_PROVIDER_SITE_OTHER): Payer: Self-pay

## 2023-12-26 ENCOUNTER — Encounter (INDEPENDENT_AMBULATORY_CARE_PROVIDER_SITE_OTHER): Payer: Self-pay

## 2023-12-26 VITALS — BP 173/72 | HR 76 | Ht 68.0 in | Wt 375.0 lb

## 2023-12-26 DIAGNOSIS — M549 Dorsalgia, unspecified: Secondary | ICD-10-CM

## 2023-12-26 DIAGNOSIS — M793 Panniculitis, unspecified: Secondary | ICD-10-CM

## 2023-12-26 DIAGNOSIS — Z6841 Body Mass Index (BMI) 40.0 and over, adult: Secondary | ICD-10-CM

## 2023-12-26 DIAGNOSIS — I10 Essential (primary) hypertension: Secondary | ICD-10-CM

## 2023-12-26 NOTE — Progress Notes (Signed)
 Plastic & Reconstructive Surgery New Patient Visit  Patient: Terri Tran MRN: [id number] Date: 12/26/23  Referring Physician: @REFERRINGPHYSICIAN @   Chief Complaint: Excess skin   History of Present Illness:  This is a 33 y.o. female with PMH and PSH as presented below who presents for consultation for panniculectomy.  She has excess abdominal skin, which causes irritation and yeast infections, particularly under her C-section scar. She experiences back pain, which she believes is related to her weight and past epidurals.   The patient is on Wegovy . Prior to the Wegovy , her highest weight was close to 400 lbs. Since then, she has lost almost 25 lbs. Her weight today is 3745 lbs and Body mass index is 57.02 kg/m. Pt complains of rashes in between the skin folds which persist despite antifungal creams and powders.   Follows closely with her PCP but has not seen an bariatric surgeon &/or Nutrition. No known nutritional issues. No hernias. Pt does not smoke. Functional status is good. Today, Body mass index is 57.02 kg/m.SABRA  Caprini score 4  @LASTWEIGHT3ENCOUNTER @  Past Medical History: Past Medical History:  Diagnosis Date   Abnormal genetic test 10/01/2018   SMA carrier Rec Fob get tested   Anemia affecting first pregnancy 2013   was taking Iron  supplements   Anxiety    Phreesia 10/12/2019   Depression 2013   postpartum depression after first delivery; was prescribed Xanax   GBS bacteriuria 09/13/2018   GERD (gastroesophageal reflux disease)    during pregnancy only; takes Tums; helps   Hypertension    Pregnancy induced hypertension    PTSD (post-traumatic stress disorder)     Past Surgical History: Past Surgical History:  Procedure Laterality Date   CESAREAN SECTION     CESAREAN SECTION N/A 09/02/2016   Procedure: CESAREAN SECTION;  Surgeon: Burnard VEAR Pate, MD;  Location: [facility]IR[facility] SU[facility];  Service: Obstetrics;  Laterality: N/A;   CESAREAN SECTION N/A 02/02/2019    Procedure: CESAREAN SECTION;  Surgeon: Barbra Lang PARAS, DO;  Location: [facility] LD [facility];  Service: Obstetrics;  Laterality: N/A;   CESAREAN SECTION N/A    Phreesia 10/12/2019   CESAREAN SECTION N/A 05/09/2020   Procedure: CESAREAN SECTION;  Surgeon: Lola Donnice CHRISTELLA, MD;  Location: [facility] LD [facility];  Service: Obstetrics;  Laterality: N/A;    Current Medications: Current Outpatient Medications on File Prior to Visit  Medication Sig Dispense Refill   amLODipine  (NORVASC ) 10 MG tablet Take 1 tablet (10 mg total) by mouth daily. 30 tablet 4   Blood Pressure Monitoring (BLOOD PRESSURE CUFF) MISC Take BP once daily. 1 each 0   hydrochlorothiazide  (HYDRODIURIL ) 12.5 MG tablet Take 1 tablet (12.5 mg total) by mouth daily. 90 tablet 3   hydrOXYzine  (ATARAX ) 25 MG tablet Take 1 tablet (25 mg total) by mouth 2 (two) times daily as needed (panic attacks or sleep). 60 tablet 2   metroNIDAZOLE  (FLAGYL ) 500 MG tablet Take 1 tablet (500 mg total) by mouth 2 (two) times daily. (Patient not taking: Reported on 12/17/2023) 14 tablet 0   Semaglutide -Weight Management (WEGOVY ) 1.7 MG/0.75ML SOAJ Inject 1.7 mg into the skin once a week. 3 mL 2   Semaglutide -Weight Management (WEGOVY ) 2.4 MG/0.75ML SOAJ Inject 2.4 mg into the skin once a week. 3 mL 2   No current facility-administered medications on file prior to visit.    Allergies: Allergies  Allergen Reactions   Penicillins Hives    Family History:  Family history is negative for bleeding/clotting disorders, problems with anesthesia,  or connective tissue disorders.   Social History:  Social History   Socioeconomic History   Marital status: Single    Spouse name: Not on file   Number of children: Not on file   Years of education: Not on file   Highest education level: Associate degree: academic program  Occupational History   Not on file  Tobacco Use   Smoking status: Former    Current packs/day: 0.00    Types: Cigarettes    Quit date: 07/21/2018    Years  since quitting: 5.4   Smokeless tobacco: Never  Vaping Use   Vaping status: Never Used  Substance and Sexual Activity   Alcohol use: Not Currently    Alcohol/week: 7.0 standard drinks of alcohol    Types: 7 Cans of beer per week    Comment: occ   Drug use: No   Sexual activity: Yes    Birth control/protection: None  Other Topics Concern   Not on file  Social History Narrative   Not on file   Social Drivers of Health   Financial Resource Strain: Low Risk  (10/11/2023)   Overall Financial Resource Strain (CARDIA)    Difficulty of Paying Living Expenses: Not very hard  Food Insecurity: Food Insecurity Present (10/11/2023)   Hunger Vital Sign    Worried About Running Out of Food in the Last Year: Never true    Ran Out of Food in the Last Year: Sometimes true  Transportation Needs: Unmet Transportation Needs (10/11/2023)   PRAPARE - Administrator, Civil Service (Medical): Yes    Lack of Transportation (Non-Medical): No  Physical Activity: Sufficiently Active (10/11/2023)   Exercise Vital Sign    Days of Exercise per Week: 5 days    Minutes of Exercise per Session: 90 min  Stress: No Stress Concern Present (10/11/2023)   [employer] of Occupational Health - Occupational Stress Questionnaire    Feeling of Stress : Not at all  Social Connections: Socially Isolated (12/05/2023)   Social Connection and Isolation Panel    Frequency of Communication with Friends and Family: More than three times a week    Frequency of Social Gatherings with Friends and Family: Never    Attends Religious Services: Never    Database administrator or Organizations: No    Attends Engineer, structural: Never    Marital Status: Never married   Smoker no Recreational Drug use: no  Review of systems: 10 point review of systems performed and negative except as noted in the HPI  Physical Exam: There were no vitals taken for this visit. There is no height or weight on file to  calculate BMI.  Physical Exam           General: Well appearing, no apparent distress. Pulm: Breathing comfortably on room air without sounds/wheezing. CV: Regular rate. Good perfusion of extremities. Abdomen: Deferred as she does not meet criteria for surgery. Neuro: Moving all four extremities spontaneously. Psych: Appropriate mood and affect.  Labs: @LASTLABBYCOMP (HGBA1C,TSH)@  @LASTLABBYCOMP (WBC,RBC,HGB,HCT,MCV,MCH,MCHC,RDWSD,PLT,MPV)@  @LASTLABBYCOMP (NA,K,CL,CARDIOXIDE,ANIONGAP,GLUCOSE,BUN,CREATBLD,CALCIUM,BILITOTAL,ALBUMIN,PROTEINTOT,ALKPHOS,AST,ALT)@  @LASTLABBYCOMP (VITAMINARETI,VITAMINB1WHO,VITB2,PLASMAVITAMI,VITAMINB12LE,D25OHT,FERRITIN,FOLATELEVEL,PTH,TIBC,MAGNESIUM3,PHOSPHORSP,COPPER,ZINCLEVEL,PREALBUMIN)@  Pertinent Imaging:  None  Assessment: This is a 33 y.o. female with history of morbid obesity who presents for consultation for body contouring. Based on the patient's exam and discussion of patient's goals today, I believe the patient would be an not yet appropriate candidate for plastic surgery. BMI should be at least below 45 and timing of body contouring procedures should allow 12 months after bariatric surgery and 6 months at a stable  weight for patients to achieve metabolic and nutritional homeostasis. The patient's BMI today is Body mass index is 57.02 kg/m. The patient voiced understanding and wishes to proceed. All questions and concerns were addressed to the patient's apparent satisfaction.  Plan - We will plan to follow up in 3-6 months to make sure she has seen [facility] and a bariatric surgeon.  The sensitive parts of the examination/procedure were performed with a chaperone.  The time documented represents the total time spent on the day of the encounter in preparing for and completing the visit. It does not include time spent by ancillary staff, a resident, a fellow, another trainee, or, for shared visits, time spent jointly with the  patient or discussing the case or the performance of other separately performed services.  Time spent: 30 minutes.     Magen Suriano, MD [facility]  12/26/2023 8:28 AM

## 2024-01-05 DIAGNOSIS — F411 Generalized anxiety disorder: Secondary | ICD-10-CM | POA: Diagnosis not present

## 2024-01-05 DIAGNOSIS — F331 Major depressive disorder, recurrent, moderate: Secondary | ICD-10-CM | POA: Diagnosis not present

## 2024-01-21 DIAGNOSIS — F411 Generalized anxiety disorder: Secondary | ICD-10-CM | POA: Diagnosis not present

## 2024-01-21 DIAGNOSIS — F331 Major depressive disorder, recurrent, moderate: Secondary | ICD-10-CM | POA: Diagnosis not present

## 2024-01-23 ENCOUNTER — Institutional Professional Consult (permissible substitution) (INDEPENDENT_AMBULATORY_CARE_PROVIDER_SITE_OTHER): Admitting: Nurse Practitioner

## 2024-01-23 DIAGNOSIS — F411 Generalized anxiety disorder: Secondary | ICD-10-CM | POA: Diagnosis not present

## 2024-01-23 DIAGNOSIS — F331 Major depressive disorder, recurrent, moderate: Secondary | ICD-10-CM | POA: Diagnosis not present
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
## 2024-01-24 ENCOUNTER — Telehealth (HOSPITAL_COMMUNITY): Admitting: Psychiatry

## 2024-01-24 ENCOUNTER — Encounter (HOSPITAL_COMMUNITY): Payer: Self-pay | Admitting: Psychiatry

## 2024-01-24 DIAGNOSIS — F3342 Major depressive disorder, recurrent, in full remission: Secondary | ICD-10-CM | POA: Diagnosis not present

## 2024-01-24 DIAGNOSIS — F4310 Post-traumatic stress disorder, unspecified: Secondary | ICD-10-CM

## 2024-01-24 DIAGNOSIS — F411 Generalized anxiety disorder: Secondary | ICD-10-CM | POA: Diagnosis not present

## 2024-01-24 DIAGNOSIS — D509 Iron deficiency anemia, unspecified: Secondary | ICD-10-CM | POA: Diagnosis not present

## 2024-01-24 DIAGNOSIS — F331 Major depressive disorder, recurrent, moderate: Secondary | ICD-10-CM | POA: Diagnosis not present

## 2024-01-24 DIAGNOSIS — F410 Panic disorder [episodic paroxysmal anxiety] without agoraphobia: Secondary | ICD-10-CM | POA: Diagnosis not present

## 2024-01-24 MED ORDER — HYDROXYZINE HCL 25 MG PO TABS: 25.0000 mg | ORAL_TABLET | Freq: Two times a day (BID) | ORAL | 2 refills | Status: DC | PRN

## 2024-01-24 MED ORDER — SERTRALINE HCL 100 MG PO TABS: 100.0000 mg | ORAL_TABLET | Freq: Every day | ORAL | 2 refills | Status: DC

## 2024-01-24 NOTE — Patient Instructions (Signed)

## 2024-01-28 DIAGNOSIS — F411 Generalized anxiety disorder: Secondary | ICD-10-CM | POA: Diagnosis not present

## 2024-01-28 DIAGNOSIS — F331 Major depressive disorder, recurrent, moderate: Secondary | ICD-10-CM | POA: Diagnosis not present

## 2024-02-04 ENCOUNTER — Ambulatory Visit: Admitting: Dietician

## 2024-02-04 DIAGNOSIS — F411 Generalized anxiety disorder: Secondary | ICD-10-CM | POA: Diagnosis not present

## 2024-02-04 DIAGNOSIS — F331 Major depressive disorder, recurrent, moderate: Secondary | ICD-10-CM | POA: Diagnosis not present

## 2024-02-05 ENCOUNTER — Institutional Professional Consult (permissible substitution) (INDEPENDENT_AMBULATORY_CARE_PROVIDER_SITE_OTHER): Admitting: Nurse Practitioner

## 2024-02-07 DIAGNOSIS — F331 Major depressive disorder, recurrent, moderate: Secondary | ICD-10-CM | POA: Diagnosis not present

## 2024-02-07 DIAGNOSIS — F411 Generalized anxiety disorder: Secondary | ICD-10-CM | POA: Diagnosis not present

## 2024-02-18 DIAGNOSIS — F331 Major depressive disorder, recurrent, moderate: Secondary | ICD-10-CM | POA: Diagnosis not present

## 2024-02-18 DIAGNOSIS — F411 Generalized anxiety disorder: Secondary | ICD-10-CM | POA: Diagnosis not present

## 2024-02-20 DIAGNOSIS — F331 Major depressive disorder, recurrent, moderate: Secondary | ICD-10-CM | POA: Diagnosis not present

## 2024-02-20 DIAGNOSIS — F411 Generalized anxiety disorder: Secondary | ICD-10-CM | POA: Diagnosis not present

## 2024-02-25 ENCOUNTER — Ambulatory Visit: Admitting: Internal Medicine

## 2024-02-27 DIAGNOSIS — F411 Generalized anxiety disorder: Secondary | ICD-10-CM | POA: Diagnosis not present

## 2024-02-27 DIAGNOSIS — F331 Major depressive disorder, recurrent, moderate: Secondary | ICD-10-CM | POA: Diagnosis not present

## 2024-02-28 DIAGNOSIS — F331 Major depressive disorder, recurrent, moderate: Secondary | ICD-10-CM | POA: Diagnosis not present

## 2024-02-28 DIAGNOSIS — F411 Generalized anxiety disorder: Secondary | ICD-10-CM | POA: Diagnosis not present

## 2024-03-03 DIAGNOSIS — F331 Major depressive disorder, recurrent, moderate: Secondary | ICD-10-CM | POA: Diagnosis not present

## 2024-03-03 DIAGNOSIS — F411 Generalized anxiety disorder: Secondary | ICD-10-CM | POA: Diagnosis not present

## 2024-03-06 DIAGNOSIS — F411 Generalized anxiety disorder: Secondary | ICD-10-CM | POA: Diagnosis not present

## 2024-03-06 DIAGNOSIS — F331 Major depressive disorder, recurrent, moderate: Secondary | ICD-10-CM | POA: Diagnosis not present

## 2024-03-11 DIAGNOSIS — F411 Generalized anxiety disorder: Secondary | ICD-10-CM | POA: Diagnosis not present

## 2024-03-11 DIAGNOSIS — F331 Major depressive disorder, recurrent, moderate: Secondary | ICD-10-CM | POA: Diagnosis not present

## 2024-03-18 DIAGNOSIS — F411 Generalized anxiety disorder: Secondary | ICD-10-CM | POA: Diagnosis not present

## 2024-03-18 DIAGNOSIS — F331 Major depressive disorder, recurrent, moderate: Secondary | ICD-10-CM | POA: Diagnosis not present

## 2024-03-20 DIAGNOSIS — F411 Generalized anxiety disorder: Secondary | ICD-10-CM | POA: Diagnosis not present

## 2024-03-20 DIAGNOSIS — F331 Major depressive disorder, recurrent, moderate: Secondary | ICD-10-CM | POA: Diagnosis not present

## 2024-03-25 DIAGNOSIS — F411 Generalized anxiety disorder: Secondary | ICD-10-CM | POA: Diagnosis not present

## 2024-03-25 DIAGNOSIS — F331 Major depressive disorder, recurrent, moderate: Secondary | ICD-10-CM | POA: Diagnosis not present

## 2024-03-26 ENCOUNTER — Ambulatory Visit: Admitting: Internal Medicine

## 2024-03-27 ENCOUNTER — Ambulatory Visit: Admitting: Surgical

## 2024-03-27 DIAGNOSIS — F331 Major depressive disorder, recurrent, moderate: Secondary | ICD-10-CM | POA: Diagnosis not present

## 2024-03-27 DIAGNOSIS — F411 Generalized anxiety disorder: Secondary | ICD-10-CM | POA: Diagnosis not present

## 2024-03-31 ENCOUNTER — Ambulatory Visit: Admitting: Surgical
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
## 2024-04-03 ENCOUNTER — Encounter (HOSPITAL_COMMUNITY): Payer: Self-pay | Admitting: Psychiatry

## 2024-04-03 ENCOUNTER — Telehealth (INDEPENDENT_AMBULATORY_CARE_PROVIDER_SITE_OTHER): Admitting: Psychiatry

## 2024-04-03 DIAGNOSIS — F410 Panic disorder [episodic paroxysmal anxiety] without agoraphobia: Secondary | ICD-10-CM | POA: Diagnosis not present

## 2024-04-03 DIAGNOSIS — F411 Generalized anxiety disorder: Secondary | ICD-10-CM | POA: Diagnosis not present

## 2024-04-03 DIAGNOSIS — F331 Major depressive disorder, recurrent, moderate: Secondary | ICD-10-CM | POA: Diagnosis not present

## 2024-04-03 DIAGNOSIS — F4310 Post-traumatic stress disorder, unspecified: Secondary | ICD-10-CM | POA: Diagnosis not present

## 2024-04-03 DIAGNOSIS — F3342 Major depressive disorder, recurrent, in full remission: Secondary | ICD-10-CM | POA: Diagnosis not present

## 2024-04-03 MED ORDER — SERTRALINE HCL 50 MG PO TABS: 50.0000 mg | ORAL_TABLET | Freq: Every day | ORAL | 2 refills | Status: DC

## 2024-04-03 MED ORDER — HYDROXYZINE HCL 25 MG PO TABS: 25.0000 mg | ORAL_TABLET | Freq: Every day | ORAL | 2 refills | Status: DC | PRN

## 2024-04-03 NOTE — Patient Instructions (Signed)
 Thank you for attending your appointment today.  -- DECREASE Zoloft  to 50 mg daily -- Use hydroxyzine  only as needed for anxiety -- Continue other medications as prescribed.  Please do not make any changes to medications without first discussing with your provider. If you are experiencing a psychiatric emergency, please call 911 or present to your nearest emergency department. Additional crisis, medication management, and therapy resources are included below.  [facility]  [address], [place], [place] [address] [phone] WALK-IN URGENT CARE 24/7 FOR ANYONE [address], [place], [place]  [phone] Fax: [phone] [email or web address] *Interpreters available *Accepts all insurance and uninsured for Urgent Care needs *Accepts Medicaid and uninsured for outpatient treatment (below)      ONLY FOR [facility]  Below:    Outpatient New Patient Assessment/Therapy Walk-ins:        Monday, Wednesday, and Thursday 8am until slots are full (first come, first served)                   New Patient Psychiatry/Medication Management        Monday-Friday 8am-11am (first come, first served)               For all walk-ins we ask that you arrive by 7:15am, because patients will be seen in the order of arrival.

## 2024-04-07 DIAGNOSIS — F411 Generalized anxiety disorder: Secondary | ICD-10-CM | POA: Diagnosis not present

## 2024-04-07 DIAGNOSIS — F331 Major depressive disorder, recurrent, moderate: Secondary | ICD-10-CM | POA: Diagnosis not present

## 2024-04-09 DIAGNOSIS — F331 Major depressive disorder, recurrent, moderate: Secondary | ICD-10-CM | POA: Diagnosis not present

## 2024-04-09 DIAGNOSIS — F411 Generalized anxiety disorder: Secondary | ICD-10-CM | POA: Diagnosis not present

## 2024-04-14 DIAGNOSIS — F411 Generalized anxiety disorder: Secondary | ICD-10-CM | POA: Diagnosis not present

## 2024-04-14 DIAGNOSIS — F331 Major depressive disorder, recurrent, moderate: Secondary | ICD-10-CM | POA: Diagnosis not present

## 2024-04-17 DIAGNOSIS — F411 Generalized anxiety disorder: Secondary | ICD-10-CM | POA: Diagnosis not present

## 2024-04-17 DIAGNOSIS — F331 Major depressive disorder, recurrent, moderate: Secondary | ICD-10-CM | POA: Diagnosis not present

## 2024-04-20 DIAGNOSIS — F331 Major depressive disorder, recurrent, moderate: Secondary | ICD-10-CM | POA: Diagnosis not present

## 2024-04-20 DIAGNOSIS — F411 Generalized anxiety disorder: Secondary | ICD-10-CM | POA: Diagnosis not present

## 2024-04-24 DIAGNOSIS — F331 Major depressive disorder, recurrent, moderate: Secondary | ICD-10-CM | POA: Diagnosis not present

## 2024-04-24 DIAGNOSIS — F411 Generalized anxiety disorder: Secondary | ICD-10-CM | POA: Diagnosis not present

## 2024-04-28 DIAGNOSIS — F331 Major depressive disorder, recurrent, moderate: Secondary | ICD-10-CM | POA: Diagnosis not present

## 2024-04-28 DIAGNOSIS — F411 Generalized anxiety disorder: Secondary | ICD-10-CM | POA: Diagnosis not present

## 2024-05-05 ENCOUNTER — Ambulatory Visit: Attending: Internal Medicine | Admitting: Internal Medicine

## 2024-05-05 ENCOUNTER — Encounter (INDEPENDENT_AMBULATORY_CARE_PROVIDER_SITE_OTHER): Payer: Self-pay

## 2024-05-05 VITALS — BP 144/81 | HR 75 | Ht 68.0 in | Wt 388.0 lb

## 2024-05-05 DIAGNOSIS — I10 Essential (primary) hypertension: Secondary | ICD-10-CM | POA: Diagnosis not present

## 2024-05-05 DIAGNOSIS — F330 Major depressive disorder, recurrent, mild: Secondary | ICD-10-CM | POA: Diagnosis not present

## 2024-05-05 DIAGNOSIS — E782 Mixed hyperlipidemia: Secondary | ICD-10-CM | POA: Diagnosis not present

## 2024-05-05 DIAGNOSIS — Z6841 Body Mass Index (BMI) 40.0 and over, adult: Secondary | ICD-10-CM | POA: Diagnosis not present

## 2024-05-05 NOTE — Progress Notes (Signed)
 Patient ID: Terri Tran, female    DOB: [birth year]  MRN: [id number]  CC: Medication follow-up (Wegovy  f/u - Medicaid informed patient that they are no longer paying /Requesing blood work ordered by [employer] to flu vax/)   Subjective: Terri Tran is a 33 y.o. female who presents for 2 mth f/u on HTN and wgh management. Her fiance and young son are with her.   Her concerns today include:  Patient with history of HTN, obesity, depression, GAD   Discussed the use of AI scribe software for clinical note transcription with the patient, who gave verbal consent to proceed.  History of Present Illness Terri Tran is a 33 year old female who presents for a two-month follow-up on weight management.  She was previously on Wegovy  at a maximum dose of 2.4 mg, but her insurance, Medicaid, no longer covers it due to new criteria. Her last dose was in early November. Since stopping Wegovy , she has experienced increased appetite and cravings, leading to weight gain. Her weight was 387 lbs in July, decreased to 375 lbs in August, and has increased to 388 lbs currently. She is trying to manage her diet by avoiding fried foods and sweets, opting for fruits like grapes, strawberries, and bananas when craving something sweet. -Regarding physical activity, she has not been able to walk outside due to cold weather and is waiting for a gym membership to continue her exercise routine indoors. She plans to join [employer] or the [employer] to maintain her activity level. - Cholesterol level elevated when checked over a year ago.  Would like to have level rechecked.  She did see the plastic surgeon to be considered for lipo suction on the abdomen.  He recommends weight loss getting BMI at least below 45 for best results.  He also recommended that she consider having weight reduction surgery done to help her get to this BMI.  HTN: She has a history of elevated blood pressure and is currently on amlodipine   10 mg daily and hydrochlorothiazide  12.5 mg daily. She did not take her medication this morning due to a hectic schedule but plans to take it when she returns home. She checks her blood pressure daily, which usually ranges from 130-135/80, but it elevates when she is active.  She is also being treated for depression and anxiety with Zoloft  and hydroxyzine  and reports doing well on meds. Through [facility]. She is lab tests ordered by her psychiatrist, including blood count, iron  studies, vitamin D, and thyroid  hormone levels to be done through our lab.      Patient Active Problem List   Diagnosis Date Noted   MDD (major depressive disorder), recurrent, in full remission 11/21/2023   PTSD (post-traumatic stress disorder) 09/25/2023   Essential hypertension 12/28/2022   History of cesarean section 05/09/2020   Cesarean delivery delivered 05/09/2020   History of bilateral tubal ligation 05/09/2020   History of cesarean delivery 03/15/2020   Generalized anxiety disorder with panic attacks 10/14/2019   History of GBS bacteriuria 09/13/2018   Gestational hypertension 08/31/2016   Obesity in pregnancy 08/28/2016   Morbid obesity with body mass index (BMI) of 60.0 to 69.9 in adult [facility]) 08/28/2016     Medications Ordered Prior to Encounter[1]  Allergies[2]  Social History   Socioeconomic History   Marital status: Single    Spouse name: Not on file   Number of children: Not on file   Years of education: Not on file   Highest  education level: Bachelor's degree (e.g., BA, AB, BS)  Occupational History   Not on file  Tobacco Use   Smoking status: Former    Current packs/day: 0.00    Average packs/day: 0.5 packs/day    Types: Cigarettes    Quit date: 07/21/2018    Years since quitting: 5.7   Smokeless tobacco: Never  Vaping Use   Vaping status: Never Used  Substance and Sexual Activity   Alcohol use: Not Currently    Alcohol/week: 7.0 standard drinks of alcohol    Types: 7 Cans of beer  per week    Comment: occ   Drug use: No   Sexual activity: Yes    Birth control/protection: None  Other Topics Concern   Not on file  Social History Narrative   Not on file   Social Drivers of Health   Tobacco Use: Medium Risk (05/05/2024)   Patient History    Smoking Tobacco Use: Former    Smokeless Tobacco Use: Never    Passive Exposure: Not on file  Financial Resource Strain: Medium Risk (05/05/2024)   Overall Financial Resource Strain (CARDIA)    Difficulty of Paying Living Expenses: Somewhat hard  Food Insecurity: Food Insecurity Present (05/05/2024)   Epic    Worried About Programme Researcher, Broadcasting/film/video in the Last Year: Sometimes true    Ran Out of Food in the Last Year: Sometimes true  Transportation Needs: Unmet Transportation Needs (05/05/2024)   Epic    Lack of Transportation (Medical): Yes    Lack of Transportation (Non-Medical): Yes  Physical Activity: Sufficiently Active (05/05/2024)   Exercise Vital Sign    Days of Exercise per Week: 7 days    Minutes of Exercise per Session: 50 min  Stress: No Stress Concern Present (05/05/2024)   [employer] of Occupational Health - Occupational Stress Questionnaire    Feeling of Stress: Only a little  Social Connections: Unknown (05/05/2024)   Social Connection and Isolation Panel    Frequency of Communication with Friends and Family: More than three times a week    Frequency of Social Gatherings with Friends and Family: Once a week    Attends Religious Services: More than 4 times per year    Active Member of [employer] or Organizations: Yes    Attends Banker Meetings: More than 4 times per year    Marital Status: Patient declined  Intimate Partner Violence: Not At Risk (06/13/2023)   Humiliation, Afraid, Rape, and Kick questionnaire    Fear of Current or Ex-Partner: No    Emotionally Abused: No    Physically Abused: No    Sexually Abused: No  Depression (PHQ2-9): Low Risk (05/05/2024)   Depression (PHQ2-9)     PHQ-2 Score: 3  Alcohol Screen: Low Risk (05/05/2024)   Alcohol Screen    Last Alcohol Screening Score (AUDIT): 2  Housing: High Risk (05/05/2024)   Epic    Unable to Pay for Housing in the Last Year: Yes    Number of Times Moved in the Last Year: 0    Homeless in the Last Year: No  Utilities: Not At Risk (06/13/2023)   AHC Utilities    Threatened with loss of utilities: No  Health Literacy: Adequate Health Literacy (06/13/2023)   B1300 Health Literacy    Frequency of need for help with medical instructions: Never    Family History  Problem Relation Age of Onset   Hypertension Mother     Past Surgical History:  Procedure Laterality Date  CESAREAN SECTION     CESAREAN SECTION N/A 09/02/2016   Procedure: CESAREAN SECTION;  Surgeon: Burnard VEAR Pate, MD;  Location: [facility];  Service: Obstetrics;  Laterality: N/A;   CESAREAN SECTION N/A 02/02/2019   Procedure: CESAREAN SECTION;  Surgeon: Barbra Lang PARAS, DO;  Location: [facility] LD ORS;  Service: Obstetrics;  Laterality: N/A;   CESAREAN SECTION N/A    Phreesia 10/12/2019   CESAREAN SECTION N/A 05/09/2020   Procedure: CESAREAN SECTION;  Surgeon: Lola Donnice HERO, MD;  Location: [facility] LD ORS;  Service: Obstetrics;  Laterality: N/A;    ROS: Review of Systems Negative except as stated above  PHYSICAL EXAM: BP (!) 144/81   Pulse 75   Ht 5' 8 (1.727 m)   Wt (!) 388 lb (176 kg)   SpO2 99%   BMI 59.00 kg/m   Wt Readings from Last 3 Encounters:  05/05/24 (!) 388 lb (176 kg)  12/26/23 (!) 375 lb (170.1 kg)  12/17/23 (!) 387 lb (175.5 kg)    Physical Exam  General appearance - alert, well appearing, young to middle age AAF and in no distress Mental status - normal mood, behavior, speech, dress, motor activity, and thought processes Chest - clear to auscultation, no wheezes, rales or rhonchi, symmetric air entry Heart - normal rate, regular rhythm, normal S1, S2, no murmurs, rubs, clicks or gallops Extremities - peripheral  pulses normal, no pedal edema, no clubbing or cyanosis     05/05/2024   10:45 AM 12/17/2023    2:33 PM 12/05/2023    1:09 PM  Depression screen PHQ 2/9  Decreased Interest 1 0 1  Down, Depressed, Hopeless 1 0 1  PHQ - 2 Score 2 0 2  Altered sleeping 0 2 3  Tired, decreased energy 0 1 2  Change in appetite 1 0 1  Feeling bad or failure about yourself  0 0 1  Trouble concentrating 0 0 2  Moving slowly or fidgety/restless 0 0 2  Suicidal thoughts 0 0 1  PHQ-9 Score 3 3  14    Difficult doing work/chores Not difficult at all Somewhat difficult Somewhat difficult     Data saved with a previous flowsheet row definition       Latest Ref Rng & Units 12/28/2022   10:23 AM 05/11/2020    8:22 AM 05/06/2020   12:00 PM  CMP  Glucose 70 - 99 mg/dL 91  73  86   BUN 6 - 20 mg/dL 8  8  9    Creatinine 0.57 - 1.00 mg/dL 9.25  9.38  9.31   Sodium 134 - 144 mmol/L 140  136  136   Potassium 3.5 - 5.2 mmol/L 4.5  3.9  3.8   Chloride 96 - 106 mmol/L 104  106  105   CO2 20 - 29 mmol/L 22  21  20    Calcium 8.7 - 10.2 mg/dL 9.6  8.5  9.6   Total Protein 6.0 - 8.5 g/dL 6.8  5.4  6.3   Total Bilirubin 0.0 - 1.2 mg/dL <9.7  0.4  0.3   Alkaline Phos 44 - 121 IU/L 68  48  56   AST 0 - 40 IU/L 12  17  12    ALT 0 - 32 IU/L 20  16  15     Lipid Panel     Component Value Date/Time   CHOL 200 (H) 12/28/2022 1023   TRIG 129 12/28/2022 1023   HDL 67 12/28/2022 1023   CHOLHDL 3.0  12/28/2022 1023   LDLCALC 110 (H) 12/28/2022 1023    CBC    Component Value Date/Time   WBC 9.2 12/28/2022 1023   WBC 16.4 (H) 05/10/2020 0525   RBC 5.38 (H) 12/28/2022 1023   RBC 3.77 (L) 05/10/2020 0525   HGB 13.2 12/28/2022 1023   HCT 42.2 12/28/2022 1023   PLT 543 (H) 12/28/2022 1023   MCV 78 (L) 12/28/2022 1023   MCH 24.5 (L) 12/28/2022 1023   MCH 29.2 05/10/2020 0525   MCHC 31.3 (L) 12/28/2022 1023   MCHC 34.1 05/10/2020 0525   RDW 14.8 12/28/2022 1023   LYMPHSABS 1.9 03/09/2020 1634   MONOABS 0.8 05/29/2017  1947   EOSABS 0.1 03/09/2020 1634   BASOSABS 0.0 03/09/2020 1634    ASSESSMENT AND PLAN: 1. Morbid obesity with BMI of 50.0-59.9, adult (HCC) (Primary) Patient no longer on Wegovy  due to insurance not covering the medication anymore.  She does not have sleep apnea to qualify for Zepbound either.  We discussed weight reduction surgery and she is not interested in going that route.  Also informed her about medical weight management program and she is interested in pursuing this. -Encouraged trying to eat smaller portions.  Be mindful of portion size and be mindful of portions prizes and avoid sugary snacks during the holidays -Advised of indoor exercise that she can do at home like walking in place for 30 minutes several days a week until she gets in at the [facility] - Amb Ref to Medical Weight Management - TSH  2. Essential hypertension Not at goal.  She has not taken medicines as yet for the morning.  She will take them as soon as she returns home.  Continue amlodipine  10 mg daily and HCTZ 12.5 mg daily - CBC - Comprehensive metabolic panel with GFR  3. Mixed hyperlipidemia See #1 - Lipid panel  4. Major depressive disorder, recurrent episode, mild Plugged in with behavioral health.  Doing well on Zoloft .  Advised patient that some of the labs that her psychiatrist would like for her to have we can order but others if we order she may end up paying out-of-pocket if no dx to associate with it like the TSH. She still wanted to have TSH checked.   Patient was given the opportunity to ask questions.  Patient verbalized understanding of the plan and was able to repeat key elements of the plan.   This documentation was completed using Paediatric nurse.  Any transcriptional errors are unintentional.  Orders Placed This Encounter  Procedures   CBC   Comprehensive metabolic panel with GFR   Lipid panel   TSH   Amb Ref to Medical Weight Management     Requested  Prescriptions    No prescriptions requested or ordered in this encounter    Return in about 4 months (around 09/03/2024).  Barnie Louder, MD, FACP     [1]  Current Outpatient Medications on File Prior to Visit  Medication Sig Dispense Refill   amLODipine  (NORVASC ) 10 MG tablet Take 1 tablet (10 mg total) by mouth daily. 30 tablet 4   Blood Pressure Monitoring (BLOOD PRESSURE CUFF) MISC Take BP once daily. 1 each 0   hydrochlorothiazide  (HYDRODIURIL ) 12.5 MG tablet Take 1 tablet (12.5 mg total) by mouth daily. 90 tablet 3   hydrOXYzine  (ATARAX ) 25 MG tablet Take 1 tablet (25 mg total) by mouth daily as needed (anxiety or sleep). 30 tablet 2   sertraline  (ZOLOFT ) 50 MG tablet Take  1 tablet (50 mg total) by mouth daily. 30 tablet 2   No current facility-administered medications on file prior to visit.  [2]  Allergies Allergen Reactions   Penicillins Hives

## 2024-05-05 NOTE — Patient Instructions (Signed)
°  VISIT SUMMARY: Today, you came in for a follow-up on your weight management. We discussed your recent weight changes, blood pressure, cholesterol levels, and mental health. You also shared your plans for physical activity and your current medication regimen.  YOUR PLAN: -MORBID OBESITY: Morbid obesity means having a very high body weight. Your weight has increased from 375 lbs to 388 lbs after stopping Wegovy  due to insurance issues. We have referred you to a medical weight management program at [facility] and encouraged you to exercise indoors for 30 minutes, three days a week.  -ESSENTIAL HYPERTENSION: Essential hypertension is high blood pressure without a known cause. Your blood pressure was elevated today at 144/81 mmHg, partly because you missed your morning dose of medication. Please ensure you take your antihypertensive medications daily and continue to monitor your blood pressure at home.  -MIXED HYPERLIPIDEMIA: Mixed hyperlipidemia means having high levels of different types of fats in your blood. Your cholesterol levels were mildly elevated in August 2024. We have ordered a cholesterol test to reassess your lipid levels.  -DEPRESSION: Depression is a mental health condition characterized by persistent sadness and loss of interest. Your depression is well-managed with Zoloft  and hydroxyzine . We administered a depression screening questionnaire today to monitor your condition.  -HISTORY OF ANEMIA: Anemia is a condition where you don't have enough healthy red blood cells. We have ordered a blood count to screen for anemia, and if detected, we will order iron  studies to determine the cause.  INSTRUCTIONS: Please follow up with the medical weight management program at [facility]. Continue taking your antihypertensive medications daily and monitor your blood pressure at home. We will reassess your cholesterol levels with a new test. Keep taking your medications for depression and complete the depression  screening questionnaire. We will also screen for anemia with a blood count test.                      Contains text generated by [employer].                                 Contains text generated by [employer].

## 2024-05-06 ENCOUNTER — Ambulatory Visit: Payer: Self-pay | Admitting: Internal Medicine

## 2024-05-06 ENCOUNTER — Ambulatory Visit: Admitting: Physician Assistant

## 2024-05-06 LAB — CBC
Hematocrit: 42.1 % (ref 34.0–46.6)
Hemoglobin: 12.9 g/dL (ref 11.1–15.9)
MCH: 24.6 pg — ABNORMAL LOW (ref 26.6–33.0)
MCHC: 30.6 g/dL — ABNORMAL LOW (ref 31.5–35.7)
MCV: 80 fL (ref 79–97)
Platelets: 591 x10E3/uL — ABNORMAL HIGH (ref 150–450)
RBC: 5.25 x10E6/uL (ref 3.77–5.28)
RDW: 15.2 % (ref 11.7–15.4)
WBC: 8.9 x10E3/uL (ref 3.4–10.8)

## 2024-05-06 LAB — LIPID PANEL
Chol/HDL Ratio: 3.8 ratio (ref 0.0–4.4)
Cholesterol, Total: 219 mg/dL — ABNORMAL HIGH (ref 100–199)
HDL: 57 mg/dL (ref >39)
LDL Chol Calc (NIH): 140 mg/dL — ABNORMAL HIGH (ref 0–99)
Triglycerides: 121 mg/dL (ref 0–149)
VLDL Cholesterol Cal: 22 mg/dL (ref 5–40)

## 2024-05-06 LAB — COMPREHENSIVE METABOLIC PANEL WITH GFR
ALT: 14 IU/L (ref 0–32)
AST: 14 IU/L (ref 0–40)
Albumin: 4.2 g/dL (ref 3.9–4.9)
Alkaline Phosphatase: 67 IU/L (ref 41–116)
BUN/Creatinine Ratio: 11 (ref 9–23)
BUN: 8 mg/dL (ref 6–20)
Bilirubin Total: 0.3 mg/dL (ref 0.0–1.2)
CO2: 23 mmol/L (ref 20–29)
Calcium: 9.7 mg/dL (ref 8.7–10.2)
Chloride: 102 mmol/L (ref 96–106)
Creatinine, Ser: 0.75 mg/dL (ref 0.57–1.00)
Globulin, Total: 2.5 g/dL (ref 1.5–4.5)
Glucose: 83 mg/dL (ref 70–99)
Potassium: 4.3 mmol/L (ref 3.5–5.2)
Sodium: 140 mmol/L (ref 134–144)
Total Protein: 6.7 g/dL (ref 6.0–8.5)
eGFR: 106 mL/min/1.73 (ref >59)

## 2024-05-06 LAB — TSH: TSH: 2.44 u[IU]/mL (ref 0.450–4.500)

## 2024-05-06 NOTE — Progress Notes (Deleted)
 No show

## 2024-05-19 ENCOUNTER — Encounter: Payer: Self-pay | Admitting: Student

## 2024-05-19 ENCOUNTER — Ambulatory Visit: Admitting: Student

## 2024-05-19 VITALS — BP 144/89 | HR 77 | Ht 68.0 in | Wt 385.8 lb

## 2024-05-19 DIAGNOSIS — M793 Panniculitis, unspecified: Secondary | ICD-10-CM

## 2024-05-19 DIAGNOSIS — Z6841 Body Mass Index (BMI) 40.0 and over, adult: Secondary | ICD-10-CM | POA: Diagnosis not present

## 2024-05-19 NOTE — Progress Notes (Signed)
"  ° °  Referring Provider [name], MD [address] [place],  [place] [address]   CC:  Chief Complaint  Patient presents with   Follow-up      Terri Tran is an 33 y.o. female.  HPI: Patient is a 34 year old female here for follow-up on her weight loss.  Patient was seen for initial consult for panniculectomy by Dr. Montorfano on 12/26/2023.  Patient reported that she had excess abdominal skin which caused irritation and yeast infection, particularly underneath her C-section scar.  Patient also reported back pain.  Patient's weight at this appointment was 375 pounds and her BMI was 57 kg/m.  It was no that BMI should be at least below 45 kg/m.  Today, patient reports she is doing well.  She states that she has not seen healthy weight and wellness or bariatric surgery.  She states that she never heard from bariatric surgery and she is still trying to get the money for the payment for healthy weight wellness.  She states that in the meantime, she has been trying to work on weight loss through diet and exercise.  She states that she is no longer taking Wegovy  as her insurance is no longer paying for it.  Review of Systems General: Does not report changes in health  Physical Exam    05/05/2024   10:36 AM 05/05/2024   10:00 AM 12/26/2023    9:08 AM  Vitals with BMI  Height  5' 8 5' 8  Weight  388 lbs 375 lbs  BMI  59.01 57.03  Systolic 144 145 826  Diastolic 81 81 72  Pulse  75 76    General:  No acute distress,  Alert and oriented, Non-Toxic, Normal speech and affect Pulmonary: Unlabored breathing, no respiratory distress MSK: Ambulatory Psych: Normal mood, normal behavior  Assessment/Plan  BMI 50.0-59.9, adult (HCC)   Patient's weight today is 385 pounds, BMI 58.66 kg/m.  I discussed with her that her weight did go up a little bit today and I highly recommended that she try to get in with healthy weight and wellness and bariatric surgery.  Patient  expressed understanding.  Will see if we can have our front office check on the referral to bariatric surgery since patient did not hear from them.  We discussed that her BMI will at minimum need to be below 45 kg/m based on Dr. Luke note.  We discussed why this is important and not it will optimize her for surgery.  She expressed understanding.  Recommend that patient follow back up in 6 to 8 months.  I instructed her to call in the meantime if she has any questions or concerns about anything.  Terri Tran 05/19/2024, 1:52 PM         "

## 2024-05-25 ENCOUNTER — Encounter: Payer: Self-pay | Admitting: Internal Medicine

## 2024-05-25 DIAGNOSIS — X102XXA Contact with fats and cooking oils, initial encounter: Secondary | ICD-10-CM

## 2024-05-25 DIAGNOSIS — T2102XA Burn of unspecified degree of abdominal wall, initial encounter: Secondary | ICD-10-CM | POA: Diagnosis not present

## 2024-05-25 DIAGNOSIS — T300 Burn of unspecified body region, unspecified degree: Secondary | ICD-10-CM

## 2024-05-27 MED ORDER — SILVER SULFADIAZINE 1 % EX CREA: TOPICAL_CREAM | CUTANEOUS | 0 refills | Status: AC

## 2024-05-27 NOTE — Telephone Encounter (Signed)
Please see the MyChart message reply(ies) for my assessment and plan.    This patient gave consent for this Medical Advice Message and is aware that it may result in a bill to their insurance company, as well as the possibility of receiving a bill for a co-payment or deductible. They are an established patient, but are not seeking medical advice exclusively about a problem treated during an in person or video visit in the last seven days. I did not recommend an in person or video visit within seven days of my reply.    I spent a total of 5 minutes cumulative time within 7 days through MyChart messaging.  Mariem Skolnick, MD   

## 2024-05-29 ENCOUNTER — Telehealth (INDEPENDENT_AMBULATORY_CARE_PROVIDER_SITE_OTHER): Admitting: Psychiatry

## 2024-05-29 ENCOUNTER — Encounter (HOSPITAL_COMMUNITY): Payer: Self-pay | Admitting: Psychiatry

## 2024-05-29 DIAGNOSIS — F410 Panic disorder [episodic paroxysmal anxiety] without agoraphobia: Secondary | ICD-10-CM | POA: Diagnosis not present

## 2024-05-29 DIAGNOSIS — F411 Generalized anxiety disorder: Secondary | ICD-10-CM

## 2024-05-29 DIAGNOSIS — F3341 Major depressive disorder, recurrent, in partial remission: Secondary | ICD-10-CM | POA: Diagnosis not present

## 2024-05-29 MED ORDER — HYDROXYZINE HCL 25 MG PO TABS: 25.0000 mg | ORAL_TABLET | Freq: Every day | ORAL | 3 refills | Status: AC | PRN

## 2024-05-29 MED ORDER — SERTRALINE HCL 50 MG PO TABS: 50.0000 mg | ORAL_TABLET | Freq: Every day | ORAL | 3 refills | Status: AC
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
## 2024-06-10 ENCOUNTER — Institutional Professional Consult (permissible substitution) (INDEPENDENT_AMBULATORY_CARE_PROVIDER_SITE_OTHER): Admitting: Family Medicine

## 2024-06-23 ENCOUNTER — Institutional Professional Consult (permissible substitution) (INDEPENDENT_AMBULATORY_CARE_PROVIDER_SITE_OTHER): Admitting: Family Medicine

## 2024-07-31 ENCOUNTER — Telehealth (HOSPITAL_COMMUNITY): Admitting: Psychiatry

## 2024-09-07 ENCOUNTER — Ambulatory Visit: Payer: Self-pay | Admitting: Internal Medicine

## 2024-11-11 ENCOUNTER — Ambulatory Visit
